# Patient Record
Sex: Female | Born: 1935 | Race: White | Hispanic: No | Smoking: Never smoker
Health system: Southern US, Community
[De-identification: ages and names within clinical notes are randomized; demographics above are authoritative.]

## PROBLEM LIST (undated history)

## (undated) DIAGNOSIS — F32A Depression, unspecified: Secondary | ICD-10-CM

## (undated) DIAGNOSIS — I509 Heart failure, unspecified: Secondary | ICD-10-CM

## (undated) DIAGNOSIS — E079 Disorder of thyroid, unspecified: Secondary | ICD-10-CM

## (undated) DIAGNOSIS — F0390 Unspecified dementia without behavioral disturbance: Secondary | ICD-10-CM

## (undated) DIAGNOSIS — I10 Essential (primary) hypertension: Secondary | ICD-10-CM

## (undated) DIAGNOSIS — I4891 Unspecified atrial fibrillation: Secondary | ICD-10-CM

## (undated) DIAGNOSIS — I2510 Atherosclerotic heart disease of native coronary artery without angina pectoris: Secondary | ICD-10-CM

## (undated) DIAGNOSIS — I639 Cerebral infarction, unspecified: Secondary | ICD-10-CM

## (undated) DIAGNOSIS — K219 Gastro-esophageal reflux disease without esophagitis: Secondary | ICD-10-CM

## (undated) DIAGNOSIS — I255 Ischemic cardiomyopathy: Secondary | ICD-10-CM

## (undated) DIAGNOSIS — F329 Major depressive disorder, single episode, unspecified: Secondary | ICD-10-CM

## (undated) HISTORY — PX: CARDIAC CATHETERIZATION: SHX172

## (undated) HISTORY — PX: TONSILLECTOMY: SUR1361

## (undated) HISTORY — PX: CHOLECYSTECTOMY: SHX55

## (undated) HISTORY — PX: FOOT SURGERY: SHX648

## (undated) HISTORY — PX: OTHER SURGICAL HISTORY: SHX169

## (undated) HISTORY — PX: HERNIA REPAIR: SHX51

---

## 2004-10-25 ENCOUNTER — Ambulatory Visit: Payer: Self-pay | Admitting: Internal Medicine

## 2005-01-06 ENCOUNTER — Inpatient Hospital Stay: Payer: Self-pay | Admitting: Internal Medicine

## 2005-01-06 ENCOUNTER — Other Ambulatory Visit: Payer: Self-pay

## 2005-02-26 ENCOUNTER — Ambulatory Visit: Payer: Self-pay | Admitting: Internal Medicine

## 2005-08-20 ENCOUNTER — Emergency Department: Payer: Self-pay | Admitting: Emergency Medicine

## 2005-12-31 ENCOUNTER — Ambulatory Visit: Payer: Self-pay | Admitting: Internal Medicine

## 2006-02-22 ENCOUNTER — Inpatient Hospital Stay: Payer: Self-pay | Admitting: Internal Medicine

## 2006-02-22 ENCOUNTER — Other Ambulatory Visit: Payer: Self-pay

## 2007-02-09 ENCOUNTER — Ambulatory Visit: Payer: Self-pay | Admitting: Internal Medicine

## 2007-02-15 ENCOUNTER — Ambulatory Visit: Payer: Self-pay | Admitting: Internal Medicine

## 2007-10-28 ENCOUNTER — Ambulatory Visit: Payer: Self-pay | Admitting: Internal Medicine

## 2008-01-12 ENCOUNTER — Ambulatory Visit: Payer: Self-pay | Admitting: Gastroenterology

## 2008-01-24 ENCOUNTER — Ambulatory Visit: Payer: Self-pay | Admitting: Gastroenterology

## 2008-02-03 ENCOUNTER — Ambulatory Visit: Payer: Self-pay | Admitting: Gastroenterology

## 2008-04-18 ENCOUNTER — Ambulatory Visit: Payer: Self-pay | Admitting: Gastroenterology

## 2008-05-09 ENCOUNTER — Ambulatory Visit: Payer: Self-pay | Admitting: General Surgery

## 2008-05-19 ENCOUNTER — Ambulatory Visit: Payer: Self-pay | Admitting: General Surgery

## 2009-02-16 ENCOUNTER — Ambulatory Visit: Payer: Self-pay | Admitting: Gastroenterology

## 2009-02-19 ENCOUNTER — Ambulatory Visit: Payer: Self-pay | Admitting: Gastroenterology

## 2009-03-21 ENCOUNTER — Ambulatory Visit: Payer: Self-pay | Admitting: Gastroenterology

## 2010-03-22 ENCOUNTER — Ambulatory Visit: Payer: Self-pay | Admitting: Internal Medicine

## 2010-03-22 ENCOUNTER — Emergency Department: Payer: Self-pay | Admitting: Emergency Medicine

## 2010-04-10 ENCOUNTER — Ambulatory Visit: Payer: Self-pay | Admitting: Gastroenterology

## 2010-05-24 ENCOUNTER — Ambulatory Visit: Payer: Self-pay | Admitting: Gastroenterology

## 2011-01-19 ENCOUNTER — Observation Stay: Payer: Self-pay | Admitting: Internal Medicine

## 2011-11-23 ENCOUNTER — Emergency Department: Payer: Self-pay | Admitting: Emergency Medicine

## 2011-11-23 LAB — COMPREHENSIVE METABOLIC PANEL
Bilirubin,Total: 0.4 mg/dL (ref 0.2–1.0)
Calcium, Total: 8.6 mg/dL (ref 8.5–10.1)
Chloride: 111 mmol/L — ABNORMAL HIGH (ref 98–107)
Co2: 26 mmol/L (ref 21–32)
EGFR (African American): >60
EGFR (Non-African Amer.): 59 — ABNORMAL LOW
Glucose: 86 mg/dL (ref 65–99)
Osmolality: 293 (ref 275–301)
SGPT (ALT): 12 U/L
Sodium: 147 mmol/L — ABNORMAL HIGH (ref 136–145)
Total Protein: 6.4 g/dL (ref 6.4–8.2)

## 2011-11-23 LAB — CBC
HCT: 38.4 % (ref 35.0–47.0)
HGB: 12.5 g/dL (ref 12.0–16.0)
MCH: 29.2 pg (ref 26.0–34.0)
MCV: 90 fL (ref 80–100)
RBC: 4.3 10*6/uL (ref 3.80–5.20)
RDW: 15.2 % — ABNORMAL HIGH (ref 11.5–14.5)

## 2011-11-23 LAB — ETHANOL: Ethanol %: <0.003 % (ref 0.000–0.080)

## 2011-11-23 LAB — TSH: Thyroid Stimulating Horm: 0.103 u[IU]/mL — ABNORMAL LOW

## 2012-01-30 ENCOUNTER — Emergency Department: Payer: Self-pay | Admitting: Emergency Medicine

## 2012-01-30 LAB — CBC
HCT: 38.7 % (ref 35.0–47.0)
MCH: 30.4 pg (ref 26.0–34.0)
MCHC: 34 g/dL (ref 32.0–36.0)
MCV: 89 fL (ref 80–100)
Platelet: 174 10*3/uL (ref 150–440)
RDW: 16.1 % — ABNORMAL HIGH (ref 11.5–14.5)

## 2012-01-30 LAB — COMPREHENSIVE METABOLIC PANEL
Albumin: 3.7 g/dL (ref 3.4–5.0)
Anion Gap: 9 (ref 7–16)
BUN: 15 mg/dL (ref 7–18)
Calcium, Total: 9.1 mg/dL (ref 8.5–10.1)
Chloride: 110 mmol/L — ABNORMAL HIGH (ref 98–107)
Creatinine: 1.02 mg/dL (ref 0.60–1.30)
EGFR (African American): 57 — ABNORMAL LOW
Glucose: 101 mg/dL — ABNORMAL HIGH (ref 65–99)
Osmolality: 290 (ref 275–301)
Potassium: 3.6 mmol/L (ref 3.5–5.1)
SGOT(AST): 24 U/L (ref 15–37)
SGPT (ALT): 15 U/L
Sodium: 145 mmol/L (ref 136–145)
Total Protein: 6.9 g/dL (ref 6.4–8.2)

## 2012-01-30 LAB — URINALYSIS, COMPLETE
Bacteria: NONE SEEN
Glucose,UR: NEGATIVE mg/dL (ref 0–75)
Hyaline Cast: 1
Nitrite: NEGATIVE
RBC,UR: 2 /HPF (ref 0–5)
Specific Gravity: 1.017 (ref 1.003–1.030)
Squamous Epithelial: <1
WBC UR: 2 /HPF (ref 0–5)

## 2012-01-30 LAB — TROPONIN I: Troponin-I: <0.02 ng/mL

## 2012-01-30 LAB — CK TOTAL AND CKMB (NOT AT ARMC): CK, Total: 258 U/L — ABNORMAL HIGH (ref 21–215)

## 2012-01-30 LAB — MAGNESIUM: Magnesium: 1.7 mg/dL — ABNORMAL LOW

## 2012-01-30 LAB — TSH: Thyroid Stimulating Horm: 0.166 u[IU]/mL — ABNORMAL LOW

## 2012-04-21 ENCOUNTER — Emergency Department: Payer: Self-pay

## 2012-04-21 LAB — COMPREHENSIVE METABOLIC PANEL
Albumin: 3.5 g/dL (ref 3.4–5.0)
Anion Gap: 8 (ref 7–16)
Calcium, Total: 8.7 mg/dL (ref 8.5–10.1)
Co2: 25 mmol/L (ref 21–32)
EGFR (African American): >60
EGFR (Non-African Amer.): 54 — ABNORMAL LOW
Glucose: 90 mg/dL (ref 65–99)
Osmolality: 287 (ref 275–301)
Potassium: 3.9 mmol/L (ref 3.5–5.1)
SGOT(AST): 17 U/L (ref 15–37)
SGPT (ALT): 14 U/L (ref 12–78)
Total Protein: 6.6 g/dL (ref 6.4–8.2)

## 2012-04-21 LAB — CBC
HGB: 12.7 g/dL (ref 12.0–16.0)
RBC: 4.42 10*6/uL (ref 3.80–5.20)

## 2012-04-21 LAB — URINALYSIS, COMPLETE
Bacteria: NONE SEEN
Bilirubin,UR: NEGATIVE
Glucose,UR: NEGATIVE mg/dL (ref 0–75)
Nitrite: NEGATIVE
Protein: NEGATIVE
RBC,UR: 1 /HPF (ref 0–5)
Specific Gravity: 1.017 (ref 1.003–1.030)
Squamous Epithelial: <1

## 2012-04-21 LAB — TROPONIN I
Troponin-I: <0.02 ng/mL
Troponin-I: <0.02 ng/mL

## 2012-04-23 LAB — URINE CULTURE

## 2012-11-24 ENCOUNTER — Emergency Department: Payer: Self-pay | Admitting: Emergency Medicine

## 2012-11-24 LAB — CK TOTAL AND CKMB (NOT AT ARMC)
CK, Total: 62 U/L (ref 21–215)
CK-MB: 1.8 ng/mL (ref 0.5–3.6)

## 2012-11-24 LAB — CBC
HGB: 13.5 g/dL (ref 12.0–16.0)
MCHC: 33.5 g/dL (ref 32.0–36.0)
MCV: 87 fL (ref 80–100)
Platelet: 196 10*3/uL (ref 150–440)
RBC: 4.6 10*6/uL (ref 3.80–5.20)
WBC: 6.7 10*3/uL (ref 3.6–11.0)

## 2012-11-24 LAB — URINALYSIS, COMPLETE
Bilirubin,UR: NEGATIVE
Blood: NEGATIVE
Glucose,UR: NEGATIVE mg/dL (ref 0–75)
Hyaline Cast: 1
Ketone: NEGATIVE
Nitrite: NEGATIVE
Specific Gravity: 1.016 (ref 1.003–1.030)
Squamous Epithelial: <1
WBC UR: 1 /HPF (ref 0–5)

## 2012-11-24 LAB — COMPREHENSIVE METABOLIC PANEL
BUN: 11 mg/dL (ref 7–18)
Calcium, Total: 8.4 mg/dL — ABNORMAL LOW (ref 8.5–10.1)
Co2: 29 mmol/L (ref 21–32)
Creatinine: 1.09 mg/dL (ref 0.60–1.30)
Glucose: 136 mg/dL — ABNORMAL HIGH (ref 65–99)
Osmolality: 286 (ref 275–301)
Potassium: 3.4 mmol/L — ABNORMAL LOW (ref 3.5–5.1)
SGOT(AST): 20 U/L (ref 15–37)
SGPT (ALT): 14 U/L (ref 12–78)
Sodium: 143 mmol/L (ref 136–145)
Total Protein: 6.5 g/dL (ref 6.4–8.2)

## 2013-03-11 ENCOUNTER — Emergency Department: Payer: Self-pay | Admitting: Emergency Medicine

## 2013-03-11 LAB — CBC WITH DIFFERENTIAL/PLATELET
Basophil #: 0 10*3/uL (ref 0.0–0.1)
Basophil %: 0.7 %
Eosinophil #: 0.1 10*3/uL (ref 0.0–0.7)
Eosinophil %: 1.5 %
HCT: 39.7 % (ref 35.0–47.0)
Lymphocyte #: 1.5 10*3/uL (ref 1.0–3.6)
Lymphocyte %: 27 %
MCH: 28.2 pg (ref 26.0–34.0)
MCHC: 33 g/dL (ref 32.0–36.0)
MCV: 85 fL (ref 80–100)
Monocyte %: 7.9 %
Neutrophil #: 3.5 10*3/uL (ref 1.4–6.5)
Platelet: 186 10*3/uL (ref 150–440)

## 2013-03-11 LAB — COMPREHENSIVE METABOLIC PANEL
Alkaline Phosphatase: 73 U/L (ref 50–136)
BUN: 13 mg/dL (ref 7–18)
Creatinine: 1 mg/dL (ref 0.60–1.30)
EGFR (Non-African Amer.): 50 — ABNORMAL LOW
Osmolality: 285 (ref 275–301)
Potassium: 3.9 mmol/L (ref 3.5–5.1)
SGOT(AST): 32 U/L (ref 15–37)
SGPT (ALT): 15 U/L (ref 12–78)

## 2013-03-11 LAB — URINALYSIS, COMPLETE
Bacteria: NONE SEEN
Glucose,UR: NEGATIVE mg/dL (ref 0–75)
Ph: 5 (ref 4.5–8.0)
Protein: NEGATIVE
RBC,UR: 2 /HPF (ref 0–5)
Specific Gravity: 1.02 (ref 1.003–1.030)
Squamous Epithelial: 1

## 2013-03-11 LAB — TSH: Thyroid Stimulating Horm: 0.05 u[IU]/mL — ABNORMAL LOW

## 2013-03-11 LAB — PRO B NATRIURETIC PEPTIDE: B-Type Natriuretic Peptide: 2043 pg/mL — ABNORMAL HIGH (ref 0–450)

## 2013-03-11 LAB — TROPONIN I: Troponin-I: <0.02 ng/mL

## 2014-04-29 ENCOUNTER — Inpatient Hospital Stay: Payer: Self-pay | Admitting: Specialist

## 2014-04-29 LAB — URINALYSIS, COMPLETE
Bacteria: NONE SEEN
Bilirubin,UR: NEGATIVE
Blood: NEGATIVE
Glucose,UR: NEGATIVE mg/dL (ref 0–75)
Ketone: NEGATIVE
Leukocyte Esterase: NEGATIVE
Nitrite: NEGATIVE
Ph: 6 (ref 4.5–8.0)
Protein: NEGATIVE
SPECIFIC GRAVITY: 1.008 (ref 1.003–1.030)
WBC UR: 3 /HPF (ref 0–5)

## 2014-04-29 LAB — CBC
HCT: 40.8 % (ref 35.0–47.0)
HGB: 12.5 g/dL (ref 12.0–16.0)
MCH: 25 pg — AB (ref 26.0–34.0)
MCHC: 30.7 g/dL — AB (ref 32.0–36.0)
MCV: 82 fL (ref 80–100)
PLATELETS: 241 10*3/uL (ref 150–440)
RBC: 5.01 10*6/uL (ref 3.80–5.20)
RDW: 16.9 % — ABNORMAL HIGH (ref 11.5–14.5)
WBC: 7.1 10*3/uL (ref 3.6–11.0)

## 2014-04-29 LAB — BASIC METABOLIC PANEL
ANION GAP: 10 (ref 7–16)
BUN: 18 mg/dL (ref 7–18)
CALCIUM: 8.8 mg/dL (ref 8.5–10.1)
Chloride: 108 mmol/L — ABNORMAL HIGH (ref 98–107)
Co2: 27 mmol/L (ref 21–32)
Creatinine: 1.22 mg/dL (ref 0.60–1.30)
GFR CALC AF AMER: 53 — AB
GFR CALC NON AF AMER: 44 — AB
Glucose: 111 mg/dL — ABNORMAL HIGH (ref 65–99)
OSMOLALITY: 291 (ref 275–301)
POTASSIUM: 3.7 mmol/L (ref 3.5–5.1)
SODIUM: 145 mmol/L (ref 136–145)

## 2014-04-29 LAB — TROPONIN I
TROPONIN-I: 0.07 ng/mL — AB
TROPONIN-I: 0.07 ng/mL — AB
Troponin-I: 0.07 ng/mL — ABNORMAL HIGH

## 2014-04-29 LAB — TSH: Thyroid Stimulating Horm: 0.38 u[IU]/mL — ABNORMAL LOW

## 2014-04-29 LAB — PRO B NATRIURETIC PEPTIDE: B-TYPE NATIURETIC PEPTID: 11144 pg/mL — AB (ref 0–450)

## 2014-04-29 LAB — T4, FREE: Free Thyroxine: 1.3 ng/dL (ref 0.76–1.46)

## 2014-04-30 LAB — BASIC METABOLIC PANEL
ANION GAP: 9 (ref 7–16)
BUN: 20 mg/dL — ABNORMAL HIGH (ref 7–18)
CO2: 28 mmol/L (ref 21–32)
CREATININE: 1.19 mg/dL (ref 0.60–1.30)
Calcium, Total: 8.8 mg/dL (ref 8.5–10.1)
Chloride: 108 mmol/L — ABNORMAL HIGH (ref 98–107)
GFR CALC AF AMER: 55 — AB
GFR CALC NON AF AMER: 45 — AB
Glucose: 113 mg/dL — ABNORMAL HIGH (ref 65–99)
OSMOLALITY: 292 (ref 275–301)
POTASSIUM: 3.8 mmol/L (ref 3.5–5.1)
SODIUM: 145 mmol/L (ref 136–145)

## 2014-04-30 LAB — LIPID PANEL
Cholesterol: 109 mg/dL (ref 0–200)
HDL Cholesterol: 37 mg/dL — ABNORMAL LOW (ref 40–60)
Ldl Cholesterol, Calc: 47 mg/dL (ref 0–100)
Triglycerides: 123 mg/dL (ref 0–200)
VLDL Cholesterol, Calc: 25 mg/dL (ref 5–40)

## 2014-04-30 LAB — PRO B NATRIURETIC PEPTIDE: B-TYPE NATIURETIC PEPTID: 8825 pg/mL — AB (ref 0–450)

## 2014-04-30 LAB — MAGNESIUM: MAGNESIUM: 2 mg/dL

## 2014-10-27 NOTE — Consult Note (Signed)
PATIENT NAME:  Heidi Miller, Heidi Miller MR#:  [id number] DATE OF BIRTH:  [birth year]  DATE OF CONSULTATION:  03/11/2013  CONSULTING PHYSICIAN:  Emmelia Holdsworth B. Jennet Maduro, MD  REQUESTING PHYSICIAN:  Daryel November, MD  REASON FOR CONSULTATION: To evaluate suicidal patient.   IDENTIFYING DATA: Heidi Miller is an 81 year old female with history of mild depression and cognitive decline.   CHIEF COMPLAINT: "I cannot think of a reason not to kill myself."   HISTORY OF PRESENT ILLNESS:  Heidi Miller has a history of mild depression. She has been treated with Celexa and Remeron Dr. Hewitt Shorts, her primary physician. She also, per family report, suffers mild cognitive decline, for which she is treated with Namenda. However, the patient continues to live independently and has been able to take full care of her household. Her niece lives next door and helps. It was the niece who brought this patient to the Emergency Room with complaints of weakness. In talking to Emergency Room personnel, the patient disclosed that for the past week or so she has been feeling increasingly down and frankly suicidal. She has never experienced similar symptoms. She has been tearful and unable to explain what has been going on, but rather stern about her suicidal thoughts. She reports many symptoms of depression with poor sleep, decreased appetite, anhedonia, crying, feeling of guilt, hopelessness, poor memory and concentration, social isolation. She was initially referred to geropsychiatry units in our area. However, her niece, who brought the patient to the hospital, demanded that she is evaluated by a psychiatrist, and was in disagreement with our assessment that the patient is suicidal and has to be admitted. After talking to her, I believe that I understood that the niece, who is not the closest relative, did not want to take responsibility for psychiatric hospitalization. She felt that patient's daughter, who is healthcare power of attorney, but  unfortunately out of town, should be involved. She was able to understand that once the patient is on involuntary commitment, the case is out of the family's hands. The patient herself does not mind admission to the hospital, as she would like to get better. She denies psychotic symptoms. She denies symptoms suggestive of bipolar mania. There is no history of alcohol, substance or prescription pill abuse.   PAST PSYCHIATRIC HISTORY:  None, as above. She has never been hospitalized. No suicide attempts. Has never had a psychiatrist.   FAMILY PSYCHIATRIC HISTORY:  None.   PAST MEDICAL HISTORY:  Hypothyroidism, hypertension, GERD.   ALLERGIES:  No known drug allergies.   MEDICATIONS ON ADMISSION:  Aspirin 81 mg daily, citalopram 10 mg daily, Synthroid 0.1 mg daily, Namenda 10 mg twice daily, metoprolol XL 25 mg daily, Remeron 22.5 mg daily, Prilosec 20 mg daily.   SOCIAL HISTORY: As above. She lives by herself, with the family around. Her daughter is healthcare power of attorney.   REVIEW OF SYSTEMS:    CONSTITUTIONAL: No fevers or chills. Positive for weight changes. The patient reports that initially she lost some weight and then gained it back.  EYES: No double or blurred vision.  EARS, NOSE, THROAT: Positive for some hearing loss.  RESPIRATORY: No shortness of breath or cough.  CARDIOVASCULAR: No chest pain or orthopnea.  GASTROINTESTINAL: No abdominal pain, nausea, vomiting or diarrhea.  GENITOURINARY: No incontinence or frequency.  ENDOCRINE: No heat or cold intolerance.  LYMPHATIC: No anemia or easy bruising.  INTEGUMENTARY: No acne or rash.  MUSCULOSKELETAL: No muscle or joint pain.  NEUROLOGIC: No tingling or weakness.  PSYCHIATRIC: See history of present illness for details.   PHYSICAL EXAMINATION: VITAL SIGNS: Blood pressure 141/65, pulse 68, respirations 18, temperature 98.7.  GENERAL: This is a slightly obese female in no acute distress. The rest of the physical examination  is deferred to her primary attending.   LABORATORY DATA: Chemistries: Blood glucose 166, natriuretic peptide 2043, BUN 13, creatinine 1. LFTs within normal limits. TSH 0.05, with free thyroxine 1.47. CBC within normal limits. Urinalysis: Leukocyte esterase 2 +, with 38 white blood cells per field.   MENTAL STATUS EXAMINATION: The patient is examined in the Emergency Room. She is in a hospital bed. She is alert and oriented to person, place, time and situation. She is pleasant, polite and cooperative. She is very well-groomed. She is wearing a hospital gown. She maintains good eye contact. She is slightly hard of hearing. Her speech is soft. Mood is depressed, with tearful affect. Thought process is logical. Thought content: The patient states over and over again that she sees no reason to live. She makes a comment that there is nothing that would stop her from suicide, but I do not believe that she has a plan. There are no delusions or paranoia. There are no auditory or visual hallucinations. Her cognition is grossly intact, even though the patient admits that her memory is not as good as it used to be, enough, though, to live independently. Her insight and judgment are fair.   SUICIDE RISK ASSESSMENT: This is a patient with a history of depression and mild cognitive decline, who developed worsening of depression and suicidal ideation over the past week. She is at increased risk of suicide.   DIAGNOSES: AXIS I: Major depressive disorder.   AXIS II: Deferred.   AXIS III: Hypertension, GERD, hypothyroidism. Rule out urinary tract infection.   AXIS IV: Mental and physical illness, cognitive decline.   AXIS V: GAF 25.   PLAN: 1. The patient is on involuntary inpatient psychiatric commitment. She is referred to geropsychiatry facilities in our area. 2.   I restarted her on all her medications, including Remeron and Celexa.  3.  I had a lengthy discussion in person with her niece, who seems to  understand that she is not responsible for future admission of this patient.  4.  Intake nurse did speak with her daughter, who is currently out of town.  5.  Psychiatry will follow up.  6.  Please consider treatment of urinary tract infection.    ____________________________ Ellin GoodieJolanta B. Jennet MaduroPucilowska, MD jbp:mr D: 03/11/2013 21:42:00 ET T: 03/11/2013 22:22:38 ET JOB#: [id number]  cc: Charleene Callegari B. Jennet MaduroPucilowska, MD, <Dictator> Shari ProwsJOLANTA B Elexis Pollak MD ELECTRONICALLY SIGNED 03/15/2013 6:29

## 2014-10-27 NOTE — Consult Note (Signed)
Brief Consult Note: Diagnosis: Major depressive disorder.   Patient was seen by consultant.   Consult note dictated.   Recommend further assessment or treatment.   Orders entered.   Comments: Heidi Miller has a h/o depression. She complains of worsening depression for the past week with suicidal ideation.   PLAN: 1. The patient is on IVC. She patient is referred to Geropsychiatry Unit.   2. I restarted all her medications including Remerom and Celexa,  3. Her niece, who opposes treatment, does so because she feels responsible for what happens to the patient in the absence of her immediate family. We spoke with the daughter who seems to agree with the plan. She will be back in town on Sunday morning.   4. Psychiatry will follow up.  5. Please treat UTI.  Electronic Signatures: Kristine LineaPucilowska, Karrine Kluttz (MD)  (Signed 05-Sep-14 17:36)  Authored: Brief Consult Note   Last Updated: 05-Sep-14 17:36 by Kristine LineaPucilowska, Jeven Topper (MD)

## 2014-10-28 NOTE — H&P (Signed)
PATIENT NAME:  Heidi Miller, Heidi Miller MR#:  [id number] DATE OF BIRTH:  1923-04-27  DATE OF ADMISSION:  04/29/2014  PRIMARY CARE PHYSICIAN: Kandyce Rud, MD.   REFERRING PHYSICIAN: Glennie Isle, MD.  CHIEF COMPLAINT: Near syncope and weakness for several days.   HISTORY OF PRESENT ILLNESS: An 81 year old, Caucasian female with a past medical history of CAD, hypertension and presently in the ED with the above chief complaint. The patient is alert, awake, oriented, in no acute distress. The patient said that for the past few days, she feels weak and dizzy and almost passed out several times. In addition, the patient has shortness of breath, but she denies any fever or chills. No cough or phlegm. She denies any chest pain, palpitations, orthopnea, nocturnal dyspnea. No leg edema. No weight gain. No weight loss. The patient denies any other symptoms.   PAST MEDICAL HISTORY: Hypertension, hyperlipidemia, hypothyroidism, nephrolithiasis, dysphagia due to esophageal stricture, MI status post stent, history of Alzheimer's disease.   PAST SURGICAL HISTORY: Left acoustic neuroma resection, cholecystectomy, umbilical hernia repair, percutaneous transluminal coronary angioplasty at [facility] in 1998, right rotator cuff repair.   SOCIAL HISTORY: No smoking, drinking or illicit drugs.   FAMILY HISTORY: Father died of MI in his 5s. Mother died of lung cancer.   ALLERGIES: None.   REVIEW OF SYSTEMS:  CONSTITUTIONAL: The patient denies any fever or chills. No headache, but has dizziness and generalized weakness.  EYES: No double vision or blurred vision.  ENT: No postnasal drip, slurred speech or dysphagia.  CARDIOVASCULAR: No chest pain, palpitations, orthopnea, nocturnal dyspnea. No leg edema.  PULMONARY: Positive for shortness of breath, but denies any cough, sputum, wheezing or hemoptysis.  GASTROINTESTINAL: No abdominal pain, nausea, vomiting, diarrhea. No melena or bloody stool.  GENITOURINARY: No dysuria,  hematuria or incontinence.  SKIN: No rash or jaundice.  NEUROLOGY: No syncope, loss of consciousness or seizure, but has presyncope.  ENDOCRINE: No polyuria, polydipsia, heat or cold intolerance.  HEMATOLOGY: No easy bruising or bleeding.   HOME MEDICATIONS: Medications reconciliation list not done yet. We will follow up.   PHYSICAL EXAMINATION: VITAL SIGNS: Temperature 98, blood pressure 111/57, pulse 110, O2 97% on room air.  GENERAL: The patient is alert, awake, oriented, in no acute distress.  HEENT: Pupils round, equal and reactive to light and accommodation. Moist oral mucosa. Clear oropharynx.  NECK: Supple. No JVD or carotid bruits. No lymphadenopathy. No thyromegaly.  CARDIOVASCULAR: S1, S2. Regular rate and rhythm. No murmurs or gallops.  PULMONARY: Bilateral air entry. No wheezing, but has rales on bilateral bases. No crackles or rhonchi. No use of accessory muscles to breathe.  ABDOMEN: Soft. No distention. No tenderness. No organomegaly. Bowel sounds present.  EXTREMITIES: No edema, clubbing or cyanosis. No calf tenderness. Bilateral pedal pulses present.  SKIN: No rash or jaundice.  NEUROLOGIC: A and O x 3. No focal deficits. Power 5/5. Sensation intact.  DIAGNOSTIC DATA: Chest x-ray showed mild congestive heart failure with borderline cardiomegaly. Small effusion. Mild interstitial thickening. No pneumonia. EKG showed Afib with RVR to 112.    LABORATORY DATA: WBC 7.1, hemoglobin 12.5, platelets 241,000. Troponin 0.07. Glucose 111, BUN 18, creatinine 1.22. Electrolytes are normal. Thyroxine 1.3. BNP 11,144.   IMPRESSION:  1. New onset atrial fibrillation with rapid ventricular response.  2. Acute on chronic congestive heart failure, unknown type.  3. Hypertension.  4. Coronary artery disease.  5. Elevated troponin, possibly due to demand ischemia.   PLAN OF TREATMENT: 1. The patient will be  admitted to a telemetry floor. We will continue telemonitor, start congestive  heart failure protocol. We will start Lasix 20 mg IV b.i.d., get an echocardiograph and a cardiology consult from Dr[facility]als[facility].  2. We will continue aspirin 325 mg p.o. daily with a statin. The patient has multiple times of near syncope and has a high risk for fall. She is not a candidate for anticoagulation. We will get a physical therapy evaluation.  3. The patient's thyroid-stimulating hormone is suppressed. We will hold levothyroxine at this time.  4. I discussed the patient's condition and plan of treatment with the patient and the patient's daughter.  5. The patient wants full code.   TIME SPENT: About 56 minutes.     ____________________________ Shaune PollackQing Yetta Marceaux, MD qc:TT D: 04/29/2014 16:21:33 ET T: 04/29/2014 16:44:55 ET JOB#: [id number]  cc: Shaune PollackQing Ryenn Howeth, MD, <Dictator> Shaune PollackQING Rose Hippler MD ELECTRONICALLY SIGNED 04/30/2014 10:43

## 2014-10-28 NOTE — H&P (Signed)
PATIENT NAME:  Heidi Miller, Heidi Miller MR#:  [id number] DATE OF BIRTH:  1922/07/25  DATE OF ADMISSION:  04/29/2014  ADDENDUM  HOME MEDICATION LIST: Citalopram 20 mg tablet 0.5 tablet b.i.d., mirtazapine 45 mg 0.5 tablet p.o. at bedtime, Toprol 50 mg p.o. 0.5 tablet once a day, rivastigmine 1.5 mg p.o. b.i.d.,  Namenda 10 mg p.o. daily, omeprazole 20 mg p.o. daily, and levothyroxine 100 mcg p.o. daily.    ____________________________ Shaune PollackQing Zymiere Trostle, MD qc:ts D: 04/29/2014 18:30:04 ET T: 04/29/2014 20:25:07 ET JOB#: [id number]  cc: Shaune PollackQing Denette Hass, MD, <Dictator> Shaune PollackQING Teletha Petrea MD ELECTRONICALLY SIGNED 04/30/2014 10:54

## 2014-10-28 NOTE — Discharge Summary (Signed)
PATIENT NAME:  Heidi Miller, Heidi Miller MR#:  [id number] DATE OF BIRTH:  [birth year]  DATE OF ADMISSION:  04/29/2014 DATE OF DISCHARGE:  05/02/2014   STAT DISCHARGE SUMMARY  For a detailed note, please take a look at the history and physical done on admission by Dr. Shaune Pollack.   DIAGNOSES AT DISCHARGE:  1.  New onset atrial fibrillation with rapid ventricular response.  2.  Cardiomyopathy, ejection fraction of 20%. 3.  Congestive heart failure.  4.  Acute on chronic systolic dysfunction.  5.  Dementia.  6.  Hypertension.  7.  Hypothyroidism.   DIET: The patient is being discharged on a low-sodium diet.   ACTIVITY: As tolerated.   FOLLOWUP: Dr. Kandyce Rud and also Dr. Arnoldo Hooker in the next 1-2 weeks.   DISCHARGE MEDICATIONS: Rivastigmine 1.5 mg b.i.d., Namenda 10 mg b.i.d., omeprazole 20 mg daily, Synthroid 100 mcg daily, Remeron 45 mg 1/2 tablet at bedtime, Seroquel 50 mg daily, Effexor 150 mg daily, Aldactone 25 mg daily, lisinopril 5 mg daily, apixaban 2.5 mg b.i.d., Lasix 40 mg daily, metoprolol succinate 50 mg b.i.d.   CONSULTANTS DURING THE HOSPITAL COURSE: Dr. Arnoldo Hooker from cardiology.   PERTINENT STUDIES DONE DURING THE HOSPITAL COURSE: A chest x-ray done on admission showing findings suggestive of congestive heart failure with borderline cardiomegaly. A 2-dimensional echocardiogram done showing ejection fraction of less than 20%, severely global LV systolic dysfunction, moderate to severe tricuspid regurgitation, moderate to severe mitral valve regurgitation, moderately elevated pulmonary artery systolic pressure, mildly increased left ventricular posterior wall thickness.   HOSPITAL COURSE: This is an 81 year old female with medical problems as mentioned above, who presented to the hospital on 04/29/2014 secondary to syncope and weakness for several days, and noted to be in acute on chronic congestive heart failure with new onset atrial fibrillation.  1.  Atrial  fibrillation. This was new onset for the patient. The patient's rates were controlled with pulse doses of IV metoprolol and Cardizem. After being started on some oral regimen of Toprol, her heart rate has improved. She continues to be in chronic AFib, but it is rate controlled. Her CHADS score is around 4; therefore, she is high risk for stroke. Therefore, anticoagulation was discussed with her, and she is currently being discharged on oral Eliquis. Her echocardiogram showed an EF of 20%. The patient was seen by cardiology who agreed with this management, and she will follow-up with them in a week or 10 days after discharge.  2.  Congestive heart failure. This is acute on chronic systolic congestive heart failure. The patient had a cardiomyopathy, EF of less than 20%. This is likely AFib-related cardiomyopathy and not ischemic cardiomyopathy. The patient was diuresed with IV Lasix. She has clinically improved. She is currently being discharged on oral Lasix along with oral beta blockers, ACE inhibitors and Aldactone as stated. The patient will follow up with cardiology as an outpatient.  3.  Hypertension. The patient remains hemodynamically stable. She will continue her Toprol, lisinopril and Aldactone as stated.  4.  History of coronary artery disease with stent placement. The patient had acute chest pain. Her cardiac markers were negative. She will continue the above medications as stated.  5.  Dementia. The patient will continue her Namenda, Remeron and Rivastigmine as stated.   CODE STATUS: The patient is a Full Code.   DISPOSITION: She is being discharged to a skilled nursing facility for ongoing care.    TIME SPENT: 40 minutes    ____________________________ Rolly Pancake.  Cherlynn KaiserSainani, MD vjs:MT D: 05/02/2014 12:00:22 ET T: 05/02/2014 12:28:00 ET JOB#: [id number]  cc: Rolly PancakeVivek J. Cherlynn KaiserSainani, MD, <Dictator> Kandyce RudMarcus Babaoff, MD Lamar BlinksBruce J. Kowalski, MD Houston SirenVIVEK J Danielle Lento MD ELECTRONICALLY SIGNED 05/15/2014 11:05

## 2015-05-05 ENCOUNTER — Encounter: Payer: Self-pay | Admitting: Emergency Medicine

## 2015-05-05 ENCOUNTER — Emergency Department
Admission: EM | Admit: 2015-05-05 | Discharge: 2015-05-05 | Disposition: A | Discharge status: Home / Self Care | Payer: PPO | Attending: Emergency Medicine | Admitting: Emergency Medicine

## 2015-05-05 DIAGNOSIS — M6281 Muscle weakness (generalized): Secondary | ICD-10-CM | POA: Insufficient documentation

## 2015-05-05 DIAGNOSIS — I10 Essential (primary) hypertension: Secondary | ICD-10-CM | POA: Diagnosis not present

## 2015-05-05 DIAGNOSIS — I499 Cardiac arrhythmia, unspecified: Secondary | ICD-10-CM | POA: Diagnosis not present

## 2015-05-05 DIAGNOSIS — R531 Weakness: Secondary | ICD-10-CM | POA: Diagnosis present

## 2015-05-05 DIAGNOSIS — E860 Dehydration: Secondary | ICD-10-CM | POA: Diagnosis not present

## 2015-05-05 HISTORY — DX: Essential (primary) hypertension: I10

## 2015-05-05 HISTORY — DX: Unspecified dementia, unspecified severity, without behavioral disturbance, psychotic disturbance, mood disturbance, and anxiety: F03.90

## 2015-05-05 HISTORY — DX: Disorder of thyroid, unspecified: E07.9

## 2015-05-05 HISTORY — DX: Unspecified atrial fibrillation: I48.91

## 2015-05-05 LAB — CBC WITH DIFFERENTIAL/PLATELET
BASOS PCT: 1 %
Basophils Absolute: 0.1 10*3/uL (ref 0–0.1)
EOS ABS: 0.1 10*3/uL (ref 0–0.7)
EOS PCT: 1 %
HCT: 41.3 % (ref 35.0–47.0)
Hemoglobin: 13.5 g/dL (ref 12.0–16.0)
Lymphocytes Relative: 26 %
Lymphs Abs: 2.5 10*3/uL (ref 1.0–3.6)
MCH: 28.7 pg (ref 26.0–34.0)
MCHC: 32.8 g/dL (ref 32.0–36.0)
MCV: 87.4 fL (ref 80.0–100.0)
MONO ABS: 0.9 10*3/uL (ref 0.2–0.9)
MONOS PCT: 10 %
Neutro Abs: 6 10*3/uL (ref 1.4–6.5)
Neutrophils Relative %: 62 %
PLATELETS: 215 10*3/uL (ref 150–440)
RBC: 4.72 MIL/uL (ref 3.80–5.20)
RDW: 15.9 % — AB (ref 11.5–14.5)
WBC: 9.5 10*3/uL (ref 3.6–11.0)

## 2015-05-05 LAB — BASIC METABOLIC PANEL
ANION GAP: 8 (ref 5–15)
BUN: 23 mg/dL — AB (ref 6–20)
CHLORIDE: 107 mmol/L (ref 101–111)
CO2: 26 mmol/L (ref 22–32)
Calcium: 9.5 mg/dL (ref 8.9–10.3)
Creatinine, Ser: 1.32 mg/dL — ABNORMAL HIGH (ref 0.44–1.00)
GFR calc Af Amer: 40 mL/min — ABNORMAL LOW (ref >60)
GFR, EST NON AFRICAN AMERICAN: 34 mL/min — AB (ref >60)
GLUCOSE: 121 mg/dL — AB (ref 65–99)
POTASSIUM: 3.7 mmol/L (ref 3.5–5.1)
Sodium: 141 mmol/L (ref 135–145)

## 2015-05-05 LAB — URINALYSIS COMPLETE WITH MICROSCOPIC (ARMC ONLY)
Bilirubin Urine: NEGATIVE
GLUCOSE, UA: NEGATIVE mg/dL
KETONES UR: NEGATIVE mg/dL
NITRITE: NEGATIVE
Protein, ur: 30 mg/dL — AB
SPECIFIC GRAVITY, URINE: 1.013 (ref 1.005–1.030)
pH: 5 (ref 5.0–8.0)

## 2015-05-05 LAB — TROPONIN I

## 2015-05-05 MED ORDER — SODIUM CHLORIDE 0.9 % IV BOLUS (SEPSIS)
500.0000 mL | Freq: Once | INTRAVENOUS | Status: AC
  Administered 2015-05-05: 500 mL via INTRAVENOUS

## 2015-05-05 NOTE — ED Provider Notes (Signed)
[facility] Emergency Department Provider Note   ____________________________________________  Time seen:  I have reviewed the triage vital signs and the triage nursing note.  HISTORY  Chief Complaint Weakness   Historian Patient, who is limited historian due to dementia Daughter provides additional history  HPI Heidi Miller is a 81 y.o. female who lives at home alone. She talked with her daughter around 9:30 AM and daughter went to pick her mother up around noon and the patient did not answer the door. When the daughter got into the home she and EMS found the patient sitting in the bathtub stating that she had sat down and was unable to get back up due to generalized weakness. There is no report of traumatic injury or fall. Patient denies injury, syncope, or recent illness including no vomiting, diarrhea, nor upper respiratory symptoms.    Past Medical History  Diagnosis Date  . Hypertension   . Thyroid disease   . Atrial fibrillation (HCC)   . Dementia     There are no active problems to display for this patient.   Past Surgical History  Procedure Laterality Date  . Cholecystectomy    . Cardiac catheterization      No current outpatient prescriptions on file.  Allergies Review of patient's allergies indicates no known allergies.  No family history on file.  Social History Social History  Substance Use Topics  . Smoking status: Never Smoker   . Smokeless tobacco: Never Used  . Alcohol Use: No    Review of Systems  Constitutional: Negative for fever. Eyes: Negative for visual changes. ENT: Negative for sore throat. Cardiovascular: Negative for chest pain. Respiratory: Negative for shortness of breath. Gastrointestinal: Negative for abdominal pain, vomiting and diarrhea. Genitourinary: Negative for dysuria. Musculoskeletal: Negative for back pain. Skin: Negative for rash. Neurological: Negative for headache. 10 point Review of  Systems otherwise negative ____________________________________________   PHYSICAL EXAM:  VITAL SIGNS: ED Triage Vitals  Enc Vitals Group     BP 05/05/15 1400 107/64 mmHg     Pulse Rate 05/05/15 1402 79     Resp 05/05/15 1402 18     Temp 05/05/15 1402 98 F (36.7 C)     Temp Source 05/05/15 1402 Oral     SpO2 05/05/15 1402 97 %     Weight 05/05/15 1402 150 lb (68.04 kg)     Height 05/05/15 1402 5\' 2"  (1.575 m)     Head Cir --      Peak Flow --      Pain Score --      Pain Loc --      Pain Edu? --      Excl. in GC? --      Constitutional: Alert and cooperative. Well appearing and in no distress. Eyes: Conjunctivae are normal. PERRL. Normal extraocular movements. ENT   Head: Normocephalic and atraumatic.   Nose: No congestion/rhinnorhea.   Mouth/Throat: Mucous membranes are moist.   Neck: No stridor. Cardiovascular/Chest: Irregularly irregular. Normal rate.  No murmurs, rubs, or gallops. Respiratory: Normal respiratory effort without tachypnea nor retractions. Breath sounds are clear and equal bilaterally. No wheezes/rales/rhonchi. Gastrointestinal: Soft. No distention, no guarding, no rebound. Nontender   Genitourinary/rectal:Deferred Musculoskeletal: Nontender with normal range of motion in all extremities. No joint effusions.  No lower extremity tenderness.  No edema. Neurologic:  Normal speech and language. Mild generalized weakness of the extremities, but no focal deficit of strength or sensation.. Skin:  Skin is warm, dry and  intact. No rash noted.  ____________________________________________   EKG I, Governor Rooks, MD, the attending physician have personally viewed and interpreted all ECGs.  Atrial fibrillation. 81 bpm. Narrow QRS. Left axis deviation. Nonspecific ST and T-wave. ____________________________________________  LABS (pertinent positives/negatives)  CBC within normal limits Metabolic panel significant for BUN 23 and creatinine  1.32 Troponin less than 0.03 Urinalysis  leukocytes trace, rare bacteria, 0-5 white blood cells and 0-5 red blood cells  ____________________________________________  RADIOLOGY All Xrays were viewed by me. Imaging interpreted by Radiologist.  None __________________________________________  PROCEDURES  Procedure(s) performed: None  Critical Care performed: None  ____________________________________________   ED COURSE / ASSESSMENT AND PLAN  CONSULTATIONS: None  Pertinent labs & imaging results that were available during my care of the patient were reviewed by me and considered in my medical decision making (see chart for details).  Patient is overall well-appearing with stable vital signs. She has had no altered mental status, and is at baseline per her daughter in terms of her mental status. She does seem a little weak all over, without a focal deficit. There is no history of trauma, and on exam I see no focal deficit to make me concerned about stroke, or other intracranial emergency.  Patient is having no crackles symptoms of acute CHF.  BUN and creatinine are minimally elevated compared with prior creatinine of 1.19.   Given the mild increase in creatinine, and slight orthostatic hypotension, patient was given IV fluids until this is resolved.  I do not think the UA is consistent with acute urinary tract infection, however a culture was sent.  Patient / Family / Caregiver informed of clinical course, medical decision-making process, and agree with plan.   I discussed return precautions, follow-up instructions, and discharged instructions with patient and/or family.  ___________________________________________   FINAL CLINICAL IMPRESSION(S) / ED DIAGNOSES   Final diagnoses:  Generalized weakness  Dehydration       Governor Rooks, MD 05/05/15 1655

## 2015-05-05 NOTE — ED Notes (Signed)
Pt in no acute distress. Pt offers no complaints. Daughter at bedside.

## 2015-05-05 NOTE — ED Notes (Signed)
Pt arrived via EMS from home. Pt complained of weakness and unable to get out of tub this morning. EMS reports pt has history of dementia. EMS reports VSS.

## 2015-05-05 NOTE — ED Notes (Signed)
Pt provided with ginger ale, graham crackers and peanut butter. Daughter at bedside.

## 2015-05-05 NOTE — Discharge Instructions (Signed)
You were evaluated for generalized weakness, not have evidence of dehydration. You were given IV fluids in the emergency department. Return to the emergency department for any worsening condition including confusion or altered mental status, weakness, numbness, chest pain, trouble breathing, or any other symptoms concerning to you.   Dehydration Dehydration means your body does not have as much fluid as it needs. Your kidneys, brain, and heart will not work properly without the right amount of fluids and salt. Older adults are more likely to become dehydrated than younger adults. This is because:   Their bodies do not hold water as well.  Their bodies do not respond to temperature changes as well.  They do not get thirsty as easily or as quickly. HOME CARE  Ask your doctor how to replace body fluid losses (rehydrate).  Drink enough fluids to keep your pee (urine) clear or pale yellow.  Drink small amounts of fluids often if you feel sick to your stomach (nauseous) or throw up (vomit).  Eat like you normally do.  Avoid:  Foods or drinks high in sugar.  Bubbly (carbonated) drinks.  Juice.  Very hot or cold fluids.  Drinks with caffeine.  Fatty, greasy foods.  Alcohol.  Tobacco.  Eating too much.  Gelatin desserts.  Wash your hands to avoid spreading germs (bacteria, viruses).  Only take medicine as told by your doctor.  Keep all doctor visits as told. GET HELP IF:  You have belly (abdominal) pain that gets worse or stays in one spot (localizes).  You have a rash, stiff neck, or bad headache.  You get easily annoyed, sleepy, or are hard to wake up.  You feel weak, dizzy, or very thirsty.  You have a fever. GET HELP RIGHT AWAY IF:   You cannot drink fluid without throwing up.  You get worse even with treatment.  You throw up often.  You have watery poop (diarrhea) often.  Your vomit has blood in it or looks greenish.  Your poop (stool) has blood in  it or looks black and tarry.  You have not peed in 6 to 8 hours or have only peed a small amount of very dark pee.  You pass out (faint). MAKE SURE YOU:   Understand these instructions.  Will watch your condition.  Will get help right away if you are not doing well or get worse.   This information is not intended to replace advice given to you by your health care provider. Make sure you discuss any questions you have with your health care provider.   Document Released: 06/12/2011 Document Revised: 06/28/2013 Document Reviewed: 02/28/2013 Elsevier Interactive Patient Education [employer]2016 [employer]vier Inc.

## 2015-05-06 LAB — URINE CULTURE

## 2015-05-26 ENCOUNTER — Emergency Department
Admission: EM | Admit: 2015-05-26 | Discharge: 2015-05-26 | Disposition: A | Discharge status: Home / Self Care | Payer: PPO | Attending: Emergency Medicine | Admitting: Emergency Medicine

## 2015-05-26 ENCOUNTER — Encounter: Payer: Self-pay | Admitting: *Deleted

## 2015-05-26 DIAGNOSIS — Y9289 Other specified places as the place of occurrence of the external cause: Secondary | ICD-10-CM | POA: Diagnosis not present

## 2015-05-26 DIAGNOSIS — S40812A Abrasion of left upper arm, initial encounter: Secondary | ICD-10-CM | POA: Diagnosis not present

## 2015-05-26 DIAGNOSIS — Y998 Other external cause status: Secondary | ICD-10-CM | POA: Insufficient documentation

## 2015-05-26 DIAGNOSIS — T148XXA Other injury of unspecified body region, initial encounter: Secondary | ICD-10-CM

## 2015-05-26 DIAGNOSIS — I10 Essential (primary) hypertension: Secondary | ICD-10-CM | POA: Insufficient documentation

## 2015-05-26 DIAGNOSIS — L03116 Cellulitis of left lower limb: Secondary | ICD-10-CM | POA: Insufficient documentation

## 2015-05-26 DIAGNOSIS — S40811A Abrasion of right upper arm, initial encounter: Secondary | ICD-10-CM | POA: Diagnosis present

## 2015-05-26 DIAGNOSIS — Y9389 Activity, other specified: Secondary | ICD-10-CM | POA: Diagnosis not present

## 2015-05-26 DIAGNOSIS — L03113 Cellulitis of right upper limb: Secondary | ICD-10-CM | POA: Diagnosis not present

## 2015-05-26 DIAGNOSIS — W548XXA Other contact with dog, initial encounter: Secondary | ICD-10-CM | POA: Diagnosis not present

## 2015-05-26 MED ORDER — CEPHALEXIN 500 MG PO CAPS: 500.0000 mg | ORAL_CAPSULE | Freq: Four times a day (QID) | ORAL | Status: DC

## 2015-05-26 NOTE — ED Notes (Signed)
Pt has scratch marks on bilateral forearms from her neighbors dog, scratches have red areas surrounding them

## 2015-05-26 NOTE — ED Provider Notes (Signed)
[facility] Emergency Department Provider Note  ____________________________________________  Time seen: Approximately 3:47 PM  I have reviewed the triage vital signs and the nursing notes.   HISTORY  Chief Complaint Abrasion    HPI Heidi Miller is a 81 y.o. female who presents emergency department complaining of dog scratches to her arm that because infected. Per the daughter the patient was scratched approximately a week ago. The patient has been "picking at scabs" and now there is surrounding minor edema and erythema. The daughter is concerned that the patient now has infected abrasions. Patient has no other injury or symptoms. She is not complaining of anything at this time. She denies fever or chills. No numbness or tingling.   Past Medical History  Diagnosis Date  . Hypertension   . Thyroid disease   . Atrial fibrillation (HCC)   . Dementia     There are no active problems to display for this patient.   Past Surgical History  Procedure Laterality Date  . Cholecystectomy    . Cardiac catheterization      Current Outpatient Rx  Name  Route  Sig  Dispense  Refill  . cephALEXin (KEFLEX) 500 MG capsule   Oral   Take 1 capsule (500 mg total) by mouth 4 (four) times daily.   28 capsule   0     Allergies Review of patient's allergies indicates no known allergies.  No family history on file.  Social History Social History  Substance Use Topics  . Smoking status: Never Smoker   . Smokeless tobacco: Never Used  . Alcohol Use: No    Review of Systems Constitutional: No fever/chills Eyes: No visual changes. ENT: No sore throat. Cardiovascular: Denies chest pain. Respiratory: Denies shortness of breath. Gastrointestinal: No abdominal pain.  No nausea, no vomiting.  No diarrhea.  No constipation. Genitourinary: Negative for dysuria. Musculoskeletal: Negative for back pain. Skin: Negative for rash. Abrasions to bilateral  arms. Neurological: Negative for headaches, focal weakness or numbness.  10-point ROS otherwise negative.  ____________________________________________   PHYSICAL EXAM:  VITAL SIGNS: ED Triage Vitals  Enc Vitals Group     BP 05/26/15 1531 117/56 mmHg     Pulse Rate 05/26/15 1531 88     Resp 05/26/15 1531 20     Temp 05/26/15 1531 97.6 F (36.4 C)     Temp Source 05/26/15 1531 Oral     SpO2 05/26/15 1531 98 %     Weight 05/26/15 1531 150 lb (68.04 kg)     Height 05/26/15 1531  (1.6 m)     Head Cir --      Peak Flow --      Pain Score --      Pain Loc --      Pain Edu? --      Excl. in GC? --     Constitutional: Alert and oriented. Well appearing and in no acute distress. Eyes: Conjunctivae are normal. PERRL. EOMI. Head: Atraumatic. Nose: No congestion/rhinnorhea. Mouth/Throat: Mucous membranes are moist.  Oropharynx non-erythematous. Neck: No stridor.   Hematological/Lymphatic/Immunilogical: No cervical lymphadenopathy. Cardiovascular: Normal rate, regular rhythm. Grossly normal heart sounds.  Good peripheral circulation. Respiratory: Normal respiratory effort.  No retractions. Lungs CTAB. Gastrointestinal: Soft and nontender. No distention. No abdominal bruits. No CVA tenderness. Musculoskeletal: No lower extremity tenderness nor edema.  No joint effusions. Neurologic:  Normal speech and language. No gross focal neurologic deficits are appreciated. No gait instability. Skin:  Skin is warm, dry  and intact. No rash noted. Minor superficial abrasions to bilateral arms. Minimal surrounding erythema and edema. No fluctuance noted. No drainage noted. No streaking noted. Psychiatric: Mood and affect are normal. Speech and behavior are normal.  ____________________________________________   LABS (all labs ordered are listed, but only abnormal results are displayed)  Labs Reviewed - No data to  display ____________________________________________  EKG   ____________________________________________  RADIOLOGY   ____________________________________________   PROCEDURES  Procedure(s) performed: None  Critical Care performed: No  ____________________________________________   INITIAL IMPRESSION / ASSESSMENT AND PLAN / ED COURSE  Pertinent labs & imaging results that were available during my care of the patient were reviewed by me and considered in my medical decision making (see chart for details).  Patient's history, symptoms, physical exam are consistent with scratches from a dog as well as surrounding cellulitis. Advised patient and her daughter of diagnosis and they verbalized understanding. Advised patient to place her on antibiotics for a week for reduction of symptoms she verbalizes understanding of same. Patient advised not to irritate areas by picking at scabs. She verbalizes understanding same. ____________________________________________   FINAL CLINICAL IMPRESSION(S) / ED DIAGNOSES  Final diagnoses:  Scratches  Cellulitis of right upper extremity  Cellulitis of left lower extremity      Racheal PatchesJonathan D Cuthriell, PA-C 05/26/15 1609  Phineas SemenGraydon Goodman, MD 05/26/15 1622

## 2015-05-26 NOTE — Discharge Instructions (Signed)

## 2015-05-26 NOTE — ED Notes (Signed)
NAD noted at this time. Pt taken to the lobby via wheelchair at this time.

## 2016-03-14 ENCOUNTER — Inpatient Hospital Stay
Admission: EM | Admit: 2016-03-14 | Discharge: 2016-03-17 | DRG: 066 | Disposition: A | Discharge status: Home Health | Payer: PPO | Attending: Internal Medicine | Admitting: Internal Medicine

## 2016-03-14 ENCOUNTER — Encounter: Payer: Self-pay | Admitting: Emergency Medicine

## 2016-03-14 ENCOUNTER — Emergency Department: Payer: PPO

## 2016-03-14 DIAGNOSIS — Z801 Family history of malignant neoplasm of trachea, bronchus and lung: Secondary | ICD-10-CM

## 2016-03-14 DIAGNOSIS — I639 Cerebral infarction, unspecified: Principal | ICD-10-CM | POA: Diagnosis present

## 2016-03-14 DIAGNOSIS — F0390 Unspecified dementia without behavioral disturbance: Secondary | ICD-10-CM | POA: Diagnosis present

## 2016-03-14 DIAGNOSIS — Z7982 Long term (current) use of aspirin: Secondary | ICD-10-CM

## 2016-03-14 DIAGNOSIS — Z7901 Long term (current) use of anticoagulants: Secondary | ICD-10-CM

## 2016-03-14 DIAGNOSIS — I2510 Atherosclerotic heart disease of native coronary artery without angina pectoris: Secondary | ICD-10-CM | POA: Diagnosis present

## 2016-03-14 DIAGNOSIS — Z79899 Other long term (current) drug therapy: Secondary | ICD-10-CM

## 2016-03-14 DIAGNOSIS — I10 Essential (primary) hypertension: Secondary | ICD-10-CM | POA: Diagnosis present

## 2016-03-14 DIAGNOSIS — E785 Hyperlipidemia, unspecified: Secondary | ICD-10-CM | POA: Diagnosis present

## 2016-03-14 DIAGNOSIS — E039 Hypothyroidism, unspecified: Secondary | ICD-10-CM | POA: Diagnosis present

## 2016-03-14 DIAGNOSIS — I4891 Unspecified atrial fibrillation: Secondary | ICD-10-CM | POA: Diagnosis present

## 2016-03-14 DIAGNOSIS — Z8249 Family history of ischemic heart disease and other diseases of the circulatory system: Secondary | ICD-10-CM | POA: Diagnosis not present

## 2016-03-14 DIAGNOSIS — R1310 Dysphagia, unspecified: Secondary | ICD-10-CM | POA: Diagnosis present

## 2016-03-14 DIAGNOSIS — R4702 Dysphasia: Secondary | ICD-10-CM | POA: Diagnosis present

## 2016-03-14 DIAGNOSIS — R29810 Facial weakness: Secondary | ICD-10-CM | POA: Diagnosis present

## 2016-03-14 LAB — COMPREHENSIVE METABOLIC PANEL
ALBUMIN: 4.1 g/dL (ref 3.5–5.0)
ALT: 11 U/L — ABNORMAL LOW (ref 14–54)
ANION GAP: 12 (ref 5–15)
AST: 22 U/L (ref 15–41)
Alkaline Phosphatase: 85 U/L (ref 38–126)
BILIRUBIN TOTAL: 0.6 mg/dL (ref 0.3–1.2)
BUN: 30 mg/dL — ABNORMAL HIGH (ref 6–20)
CALCIUM: 9.6 mg/dL (ref 8.9–10.3)
CO2: 24 mmol/L (ref 22–32)
Chloride: 105 mmol/L (ref 101–111)
Creatinine, Ser: 1.42 mg/dL — ABNORMAL HIGH (ref 0.44–1.00)
GFR, EST AFRICAN AMERICAN: 36 mL/min — AB (ref >60)
GFR, EST NON AFRICAN AMERICAN: 31 mL/min — AB (ref >60)
Glucose, Bld: 209 mg/dL — ABNORMAL HIGH (ref 65–99)
POTASSIUM: 4 mmol/L (ref 3.5–5.1)
Sodium: 141 mmol/L (ref 135–145)
TOTAL PROTEIN: 7.3 g/dL (ref 6.5–8.1)

## 2016-03-14 LAB — CBC
HCT: 40.4 % (ref 35.0–47.0)
HEMOGLOBIN: 13.1 g/dL (ref 12.0–16.0)
MCH: 27.1 pg (ref 26.0–34.0)
MCHC: 32.4 g/dL (ref 32.0–36.0)
MCV: 83.7 fL (ref 80.0–100.0)
Platelets: 244 10*3/uL (ref 150–440)
RBC: 4.83 MIL/uL (ref 3.80–5.20)
RDW: 17.8 % — ABNORMAL HIGH (ref 11.5–14.5)
WBC: 8.7 10*3/uL (ref 3.6–11.0)

## 2016-03-14 LAB — ETHANOL: Alcohol, Ethyl (B): <5 mg/dL (ref <5)

## 2016-03-14 LAB — TROPONIN I: TROPONIN I: 0.03 ng/mL — AB (ref <0.03)

## 2016-03-14 LAB — DIFFERENTIAL
Basophils Absolute: 0.1 10*3/uL (ref 0–0.1)
Basophils Relative: 1 %
EOS ABS: 0.1 10*3/uL (ref 0–0.7)
EOS PCT: 1 %
LYMPHS ABS: 3.1 10*3/uL (ref 1.0–3.6)
Lymphocytes Relative: 35 %
MONO ABS: 0.8 10*3/uL (ref 0.2–0.9)
MONOS PCT: 9 %
Neutro Abs: 4.7 10*3/uL (ref 1.4–6.5)
Neutrophils Relative %: 54 %

## 2016-03-14 LAB — PROTIME-INR
INR: 1.23
Prothrombin Time: 15.6 seconds — ABNORMAL HIGH (ref 11.4–15.2)

## 2016-03-14 LAB — APTT: aPTT: 32 seconds (ref 24–36)

## 2016-03-14 NOTE — H&P (Signed)
[facility] [place] [facility]   PATIENT NAME: Heidi Miller    MR#:  [id number]  DATE OF BIRTH:  08/07/1922  DATE OF ADMISSION:  03/14/2016  PRIMARY CARE PHYSICIAN: BABAOFF, Lavada MesiMARC E, MD   REQUESTING/REFERRING PHYSICIAN: Shaune PollackLord, MD  CHIEF COMPLAINT:   Chief Complaint  Patient presents with  . Cerebrovascular Accident    Pt. has facial droop on lt. side, change in speech.    HISTORY OF PRESENT ILLNESS:  Heidi Miller  is a 82 y.o. female who presents with Speech change, dysphagia, facial droop. Patient's last normal was 24 hours ago, this morning when her caregiver came in to see her she woke up with these symptoms. She was brought to the ED for evaluation. Initial workup here is largely negative except for a very very mildly elevated troponin. She does have persistent neurological symptoms as described above. Hospitalists were called for admission and further evaluation  PAST MEDICAL HISTORY:   Past Medical History:  Diagnosis Date  . Atrial fibrillation (HCC)   . Dementia   . Hypertension   . Thyroid disease     PAST SURGICAL HISTORY:   Past Surgical History:  Procedure Laterality Date  . CARDIAC CATHETERIZATION    . CHOLECYSTECTOMY      SOCIAL HISTORY:   Social History  Substance Use Topics  . Smoking status: Never Smoker  . Smokeless tobacco: Never Used  . Alcohol use No    FAMILY HISTORY:   Family History  Problem Relation Age of Onset  . Lung cancer Mother   . Heart attack Father     DRUG ALLERGIES:  No Known Allergies  MEDICATIONS AT HOME:   Prior to Admission medications   Medication Sig Start Date End Date Taking? Authorizing Provider  apixaban (ELIQUIS) 2.5 MG TABS tablet Take 2.5 mg by mouth 2 (two) times daily.   Yes Historical Provider, MD  aspirin 81 MG chewable tablet Chew 81 mg by mouth every morning.   Yes Historical Provider, MD  cyanocobalamin (,VITAMIN B-12,) 1000 MCG/ML injection Inject 1,000 mcg into the  muscle every 30 (thirty) days.   Yes Historical Provider, MD  diltiazem (CARDIZEM CD) 120 MG 24 hr capsule Take 120 mg by mouth daily.   Yes Historical Provider, MD  furosemide (LASIX) 20 MG tablet Take 20 mg by mouth daily.   Yes Historical Provider, MD  levothyroxine (SYNTHROID, LEVOTHROID) 100 MCG tablet Take 100 mcg by mouth daily.   Yes Historical Provider, MD  memantine (NAMENDA) 10 MG tablet Take 10 mg by mouth 2 (two) times daily.   Yes Historical Provider, MD  metoprolol (LOPRESSOR) 50 MG tablet Take 25 mg by mouth daily.   Yes Historical Provider, MD  mirtazapine (REMERON) 45 MG tablet Take 22.5 mg by mouth at bedtime.   Yes Historical Provider, MD  omeprazole (PRILOSEC) 20 MG capsule Take 20 mg by mouth daily.   Yes Historical Provider, MD  QUEtiapine (SEROQUEL XR) 50 MG TB24 24 hr tablet Take 50 mg by mouth every evening.   Yes Historical Provider, MD  rivastigmine (EXELON) 1.5 MG capsule Take 1.5 mg by mouth 2 (two) times daily with a meal.   Yes Historical Provider, MD  spironolactone (ALDACTONE) 25 MG tablet Take 12.5 mg by mouth daily.   Yes Historical Provider, MD  venlafaxine XR (EFFEXOR-XR) 150 MG 24 hr capsule Take 150 mg by mouth daily.   Yes Historical Provider, MD  cephALEXin (KEFLEX) 500 MG capsule Take 1 capsule (500  mg total) by mouth 4 (four) times daily. Patient not taking: Reported on 03/14/2016 05/26/15   Delorise Royals Cuthriell, PA-C    REVIEW OF SYSTEMS:  Review of Systems  Constitutional: Negative for chills, fever, malaise/fatigue and weight loss.  HENT: Negative for ear pain, hearing loss and tinnitus.   Eyes: Negative for blurred vision, double vision, pain and redness.  Respiratory: Negative for cough, hemoptysis and shortness of breath.   Cardiovascular: Negative for chest pain, palpitations, orthopnea and leg swelling.  Gastrointestinal: Negative for abdominal pain, constipation, diarrhea, nausea and vomiting.  Genitourinary: Negative for dysuria, frequency  and hematuria.  Musculoskeletal: Negative for back pain, joint pain and neck pain.  Skin:       No acne, rash, or lesions  Neurological: Positive for speech change and focal weakness. Negative for dizziness, tremors and weakness.  Endo/Heme/Allergies: Negative for polydipsia. Does not bruise/bleed easily.  Psychiatric/Behavioral: Negative for depression. The patient is not nervous/anxious and does not have insomnia.      VITAL SIGNS:   Vitals:   03/14/16 2046 03/14/16 2130 03/14/16 2234  BP: 121/77 108/66 106/69  Pulse: 78  92  Resp: 16 17 18   SpO2: 97%  95%  Weight: 63.5 kg (140 lb)    Height: 5\' 3"  (1.6 m)     Wt Readings from Last 3 Encounters:  03/14/16 63.5 kg (140 lb)  05/26/15 68 kg (150 lb)  05/05/15 68 kg (150 lb)    PHYSICAL EXAMINATION:  Physical Exam  Vitals reviewed. Constitutional: She appears well-developed and well-nourished. No distress.  HENT:  Head: Normocephalic and atraumatic.  Mouth/Throat: Oropharynx is clear and moist.  Eyes: Conjunctivae and EOM are normal. Pupils are equal, round, and reactive to light. No scleral icterus.  Neck: Normal range of motion. Neck supple. No JVD present. No thyromegaly present.  Cardiovascular: Intact distal pulses.  Exam reveals no gallop and no friction rub.   No murmur heard. Tachycardic, irregular rhythm  Respiratory: Effort normal and breath sounds normal. No respiratory distress. She has no wheezes. She has no rales.  GI: Soft. Bowel sounds are normal. She exhibits no distension. There is no tenderness.  Musculoskeletal: Normal range of motion. She exhibits no edema.  No arthritis, no gout  Lymphadenopathy:    She has no cervical adenopathy.  Neurological: She is alert. A cranial nerve deficit is present.  Neurologic: Cranial nerves II-XII intact except for right facial droop, Sensation intact to light touch/pinprick, 5/5 strength in all extremities, mild dysarthria, no aphasia, she does report dysphagia, finger  to nose testing showed no abnormality, very mild right pronator drift, DTR intact, Babinski sign not present.   Skin: Skin is warm and dry. No rash noted. No erythema.  Psychiatric: She has a normal mood and affect. Her behavior is normal. Judgment and thought content normal.    LABORATORY PANEL:   CBC  Recent Labs Lab 03/14/16 2054  WBC 8.7  HGB 13.1  HCT 40.4  PLT 244   ------------------------------------------------------------------------------------------------------------------  Chemistries   Recent Labs Lab 03/14/16 2054  NA 141  K 4.0  CL 105  CO2 24  GLUCOSE 209*  BUN 30*  CREATININE 1.42*  CALCIUM 9.6  AST 22  ALT 11*  ALKPHOS 85  BILITOT 0.6   ------------------------------------------------------------------------------------------------------------------  Cardiac Enzymes  Recent Labs Lab 03/14/16 2054  TROPONINI 0.03*   ------------------------------------------------------------------------------------------------------------------  RADIOLOGY:  Ct Head Wo Contrast  Result Date: 03/14/2016 CLINICAL DATA:  Initial evaluation for acute altered mental status. EXAM: CT HEAD  WITHOUT CONTRAST TECHNIQUE: Contiguous axial images were obtained from the base of the skull through the vertex without intravenous contrast. COMPARISON:  Prior CT from 11/24/2012. FINDINGS: Brain: Diffuse prominence of the CSF containing spaces is compatible with generalized age-related cerebral atrophy. Patchy hypodensity within the periventricular and deep white matter both cerebral hemispheres most consistent with chronic small vessel ischemic disease. Small remote left cerebellar infarct noted, stable. Small remote right occipital infarct also noted. No acute intracranial hemorrhage. No evidence for acute large vessel territory infarct. No mass lesion, midline shift, or mass effect. No hydrocephalus. No extra-axial fluid collection. Vascular: Scattered vascular calcifications  within the carotid siphons. No hyperdense vessel. Skull: Scalp soft tissues within normal limits. Patient is status post remote craniotomy on the left. No calvarial fracture. Sinuses/Orbits: The globes and orbits demonstrate no acute abnormality. Paranasal sinuses are clear. Trace opacity within the bilateral mastoid air cells. Other: No other significant finding. IMPRESSION: 1. No acute intracranial process.  The 2. Remote right occipital and left cerebellar infarcts. 3. Generalized age-related cerebral atrophy with mild chronic microvascular ischemic disease. Electronically Signed   By: Rise Mu M.D.   On: 03/14/2016 22:00    EKG:   Orders placed or performed during the hospital encounter of 03/14/16  . ED EKG  . ED EKG  . EKG 12-Lead  . EKG 12-Lead    IMPRESSION AND PLAN:  Principal Problem:   Stroke [facility]) - we will admit her per stroke admission orders set including appropriate imaging, lab tests, and a neurology consult Active Problems:   A-fib (HCC) - continue home rate controlling medications as well as anticoagulation   Dementia - continue home meds   HTN (hypertension) - permissive hypertension for the first 24 hours: Less than 220/120   Hypothyroidism - continue home dose thyroid replacement  All the records are reviewed and case discussed with ED provider. Management plans discussed with the patient and/or family.  DVT PROPHYLAXIS: Systemic anticoagulation  GI PROPHYLAXIS: None  ADMISSION STATUS: Inpatient  CODE STATUS: Full Code Status History    This patient does not have a recorded code status. Please follow your organizational policy for patients in this situation.      TOTAL TIME TAKING CARE OF THIS PATIENT: 45 minutes.    Cordell Coke FIELDING 03/14/2016, 11:10 PM  [facility]  Office  [phone]  CC: Primary care physician; BABAOFF, Lavada Mesi, MD

## 2016-03-14 NOTE — ED Provider Notes (Signed)
[facility] Emergency Department Provider Note ____________________________________________   I have reviewed the triage vital signs and the triage nursing note.  HISTORY  Chief Complaint Cerebrovascular Accident (Pt. has facial droop on lt. side, change in speech.)   Historian Heart caveat, patient's poor historian due to dementia Daughter provides history  HPI Pam DrownMavis W Marletta Miller is a 82 y.o. female with a history dementia, hypertension, and initial fibrillation, who lives alone but has caregivers to come in the mornings. Daughter reports that the caregiver this morning noted that the patient had right facial droop and some slurred speech. This afternoon when the daughter saw her mother she also noted right facial droop. Apparently she was last seen normal yesterday afternoon or evening by the cousin who lives next door. No history of stroke per the family. She does take Eloquis as well as aspirin due to her history of A. fib and coronary artery disease.    Past Medical History:  Diagnosis Date  . Atrial fibrillation (HCC)   . Dementia   . Hypertension   . Thyroid disease     There are no active problems to display for this patient.   Past Surgical History:  Procedure Laterality Date  . CARDIAC CATHETERIZATION    . CHOLECYSTECTOMY      Prior to Admission medications   Medication Sig Start Date End Date Taking? Authorizing Provider  apixaban (ELIQUIS) 2.5 MG TABS tablet Take 2.5 mg by mouth 2 (two) times daily.   Yes Historical Provider, MD  aspirin 81 MG chewable tablet Chew 81 mg by mouth every morning.   Yes Historical Provider, MD  cyanocobalamin (,VITAMIN B-12,) 1000 MCG/ML injection Inject 1,000 mcg into the muscle every 30 (thirty) days.   Yes Historical Provider, MD  diltiazem (CARDIZEM CD) 120 MG 24 hr capsule Take 120 mg by mouth daily.   Yes Historical Provider, MD  furosemide (LASIX) 20 MG tablet Take 20 mg by mouth daily.   Yes Historical  Provider, MD  levothyroxine (SYNTHROID, LEVOTHROID) 100 MCG tablet Take 100 mcg by mouth daily.   Yes Historical Provider, MD  memantine (NAMENDA) 10 MG tablet Take 10 mg by mouth 2 (two) times daily.   Yes Historical Provider, MD  metoprolol (LOPRESSOR) 50 MG tablet Take 25 mg by mouth daily.   Yes Historical Provider, MD  mirtazapine (REMERON) 45 MG tablet Take 22.5 mg by mouth at bedtime.   Yes Historical Provider, MD  omeprazole (PRILOSEC) 20 MG capsule Take 20 mg by mouth daily.   Yes Historical Provider, MD  QUEtiapine (SEROQUEL XR) 50 MG TB24 24 hr tablet Take 50 mg by mouth every evening.   Yes Historical Provider, MD  rivastigmine (EXELON) 1.5 MG capsule Take 1.5 mg by mouth 2 (two) times daily with a meal.   Yes Historical Provider, MD  spironolactone (ALDACTONE) 25 MG tablet Take 12.5 mg by mouth daily.   Yes Historical Provider, MD  venlafaxine XR (EFFEXOR-XR) 150 MG 24 hr capsule Take 150 mg by mouth daily.   Yes Historical Provider, MD  cephALEXin (KEFLEX) 500 MG capsule Take 1 capsule (500 mg total) by mouth 4 (four) times daily. Patient not taking: Reported on 03/14/2016 05/26/15   Delorise RoyalsJonathan D Cuthriell, PA-C    No Known Allergies  History reviewed. No pertinent family history.  Social History Social History  Substance Use Topics  . Smoking status: Never Smoker  . Smokeless tobacco: Never Used  . Alcohol use No    Review of Systems  Constitutional: Negative for recent illnesses. Eyes: Negative for visual changes. ENT: Negative for sore throat. Cardiovascular: Negative for chest pain. Respiratory: Negative for shortness of breath. Gastrointestinal: Negative for abdominal pain, vomiting and diarrhea. Genitourinary: Negative for dysuria. Musculoskeletal: Negative for back pain. Skin: Negative for rash. Neurological: Negative for headache. 10 point Review of Systems otherwise negative ____________________________________________   PHYSICAL EXAM:  VITAL SIGNS: ED  Triage Vitals [03/14/16 2046]  Enc Vitals Group     BP 121/77     Pulse Rate 78     Resp 16     Temp      Temp src      SpO2 97 %     Weight 140 lb (63.5 kg)     Height 5\' 3"  (1.6 m)     Head Circumference      Peak Flow      Pain Score      Pain Loc      Pain Edu?      Excl. in GC?      Constitutional: Alert and Cooperative, but disoriented which is baseline. Well appearing and in no distress. HEENT   Head: Normocephalic and atraumatic.      Eyes: Conjunctivae are normal. PERRL. Normal extraocular movements.      Ears:         Nose: No congestion/rhinnorhea.   Mouth/Throat: Mucous membranes are moist.   Neck: No stridor. Cardiovascular/Chest: Irregularly irregular rhythm between 90 and 110.  No murmurs, rubs, or gallops. Respiratory: Normal respiratory effort without tachypnea nor retractions. Breath sounds are clear and equal bilaterally. No wheezes/rales/rhonchi. Gastrointestinal: Soft. No distention, no guarding, no rebound. Nontender.    Genitourinary/rectal:Deferred Musculoskeletal: Nontender with normal range of motion in all extremities. No joint effusions.  No lower extremity tenderness.  No edema. Neurologic:  No facial droop visible on resting expression, but with testing, she does have lip drooping on the right side. She has some slurred speech.  She is a poor historian and has underlying dementia, but is able to follow commands. Possible mild right-sided pronator drift. No appreciable weakness or motor deficit in upper or lower extremities bilaterally. Denies sensory changes. Skin:  Skin is warm, dry and intact. No rash noted. Psychiatric: No agitation.   ____________________________________________  LABS (pertinent positives/negatives)  Labs Reviewed  PROTIME-INR - Abnormal; Notable for the following:       Result Value   Prothrombin Time 15.6 (*)    All other components within normal limits  CBC - Abnormal; Notable for the following:    RDW 17.8  (*)    All other components within normal limits  COMPREHENSIVE METABOLIC PANEL - Abnormal; Notable for the following:    Glucose, Bld 209 (*)    BUN 30 (*)    Creatinine, Ser 1.42 (*)    ALT 11 (*)    GFR calc non Af Amer 31 (*)    GFR calc Af Amer 36 (*)    All other components within normal limits  TROPONIN I - Abnormal; Notable for the following:    Troponin I 0.03 (*)    All other components within normal limits  ETHANOL  APTT  DIFFERENTIAL    ____________________________________________    EKG I, Governor Rooks, MD, the attending physician have personally viewed and interpreted all ECGs.  104 bpm. Atrial fibrillation. Nonspecific intraventricular conduction delay. Left axis deviation. Nonspecific T-wave ____________________________________________  RADIOLOGY All Xrays were viewed by me. Imaging interpreted by Radiologist.  CT head without contrast:  IMPRESSION: 1. No acute intracranial process.  The 2. Remote right occipital and left cerebellar infarcts. 3. Generalized age-related cerebral atrophy with mild chronic microvascular ischemic disease. __________________________________________  PROCEDURES  Procedure(s) performed: None  Critical Care performed: None  ____________________________________________   ED COURSE / ASSESSMENT AND PLAN  Pertinent labs & imaging results that were available during my care of the patient were reviewed by me and considered in my medical decision making (see chart for details).  Ms. Montez Morita was brought in by her daughter for slurred speech and right-sided facial droop, but she is outside a stroke window as her symptoms started sometime overnight as she was last seen normal yesterday. She is outside the stroke window and a code stroke was not initiated, but workup the same way in terms of CT head and laboratory studies.  She has a history of chronic A. fib, is on Eloquis and aspirin. I'm not adding additional blood thinner at  this point time. CT head was positive for remote infarct, but no acute infarct.  However given a new right-sided facial droop and slurred speech, patient will be admitted to the hospitalist for further evaluation and workup.    CONSULTATIONS:   Hospitalist for admission.   Patient / Family / Caregiver informed of clinical course, medical decision-making process, and agree with plan.    ___________________________________________   FINAL CLINICAL IMPRESSION(S) / ED DIAGNOSES   Final diagnoses:  Facial droop              Note: This dictation was prepared with Dragon dictation. Any transcriptional errors that result from this process are unintentional    Governor Rooks, MD 03/14/16 2324

## 2016-03-14 NOTE — ED Notes (Signed)
Pt comes from home with caregiver during day. When caregiver arrived at 0900 today noticed pt was altered. Pt had right-sided facial droop, and slurred speech. Pt's daughter saw pt at approx 1800, noticed facial droop, slurred speech, and right-sided weakness. Pt's daughter also reports pt leaning more to right side.   Pt has hx of afib and dementia.

## 2016-03-14 NOTE — ED Triage Notes (Signed)
Pt. Daughter states pt. Has dementia.  Pt. Daughter states home health noticed slight difference today in patient.  Daughter states today at around 18:00 change in speech and droop in lt. Side of mouth.

## 2016-03-14 NOTE — ED Notes (Signed)
Patient transported to CT 

## 2016-03-15 ENCOUNTER — Inpatient Hospital Stay: Payer: PPO

## 2016-03-15 ENCOUNTER — Inpatient Hospital Stay
Admit: 2016-03-15 | Discharge: 2016-03-15 | Disposition: A | Discharge status: Home / Self Care | Payer: PPO | Attending: Internal Medicine | Admitting: Internal Medicine

## 2016-03-15 DIAGNOSIS — I639 Cerebral infarction, unspecified: Principal | ICD-10-CM

## 2016-03-15 LAB — LIPID PANEL
CHOLESTEROL: 192 mg/dL (ref 0–200)
HDL: 41 mg/dL (ref >40)
LDL Cholesterol: 101 mg/dL — ABNORMAL HIGH (ref 0–99)
Total CHOL/HDL Ratio: 4.7 RATIO
Triglycerides: 248 mg/dL — ABNORMAL HIGH (ref <150)
VLDL: 50 mg/dL — ABNORMAL HIGH (ref 0–40)

## 2016-03-15 LAB — TROPONIN I

## 2016-03-15 LAB — HEMOGLOBIN A1C: HEMOGLOBIN A1C: 6.9 % — AB (ref 4.0–6.0)

## 2016-03-15 LAB — ECHOCARDIOGRAM COMPLETE
Height: 63 in
WEIGHTICAEL: 2344 [oz_av]

## 2016-03-15 MED ORDER — QUETIAPINE FUMARATE ER 50 MG PO TB24
50.0000 mg | ORAL_TABLET | Freq: Every evening | ORAL | Status: DC
  Administered 2016-03-15 – 2016-03-16 (×2): 50 mg via ORAL
  Filled 2016-03-15 (×2): qty 1

## 2016-03-15 MED ORDER — STROKE: EARLY STAGES OF RECOVERY BOOK
Freq: Once | Status: AC
Start: 2016-03-15 — End: 2016-03-15
  Administered 2016-03-15: 01:00:00

## 2016-03-15 MED ORDER — DILTIAZEM HCL ER COATED BEADS 120 MG PO CP24
120.0000 mg | ORAL_CAPSULE | Freq: Every day | ORAL | Status: DC
  Administered 2016-03-15 – 2016-03-17 (×3): 120 mg via ORAL
  Filled 2016-03-15 (×3): qty 1

## 2016-03-15 MED ORDER — SPIRONOLACTONE 25 MG PO TABS
12.5000 mg | ORAL_TABLET | Freq: Every day | ORAL | Status: DC
  Administered 2016-03-15 – 2016-03-17 (×3): 12.5 mg via ORAL
  Filled 2016-03-15 (×4): qty 1

## 2016-03-15 MED ORDER — APIXABAN 2.5 MG PO TABS
2.5000 mg | ORAL_TABLET | Freq: Two times a day (BID) | ORAL | Status: DC
  Administered 2016-03-15 – 2016-03-17 (×5): 2.5 mg via ORAL
  Filled 2016-03-15 (×5): qty 1

## 2016-03-15 MED ORDER — ASPIRIN 81 MG PO CHEW
81.0000 mg | CHEWABLE_TABLET | ORAL | Status: DC
  Administered 2016-03-16 – 2016-03-17 (×2): 81 mg via ORAL
  Filled 2016-03-15 (×2): qty 1

## 2016-03-15 MED ORDER — METOPROLOL TARTRATE 25 MG PO TABS
25.0000 mg | ORAL_TABLET | Freq: Every day | ORAL | Status: DC
  Administered 2016-03-15 – 2016-03-17 (×3): 25 mg via ORAL
  Filled 2016-03-15 (×3): qty 1

## 2016-03-15 MED ORDER — MEMANTINE HCL 10 MG PO TABS
10.0000 mg | ORAL_TABLET | Freq: Two times a day (BID) | ORAL | Status: DC
  Administered 2016-03-15 – 2016-03-17 (×5): 10 mg via ORAL
  Filled 2016-03-15 (×7): qty 1

## 2016-03-15 MED ORDER — SODIUM CHLORIDE 0.9 % IV SOLN
INTRAVENOUS | Status: DC
  Administered 2016-03-15: 01:00:00 via INTRAVENOUS

## 2016-03-15 MED ORDER — LEVOTHYROXINE SODIUM 100 MCG PO TABS
100.0000 ug | ORAL_TABLET | Freq: Every day | ORAL | Status: DC
  Administered 2016-03-15 – 2016-03-17 (×3): 100 ug via ORAL
  Filled 2016-03-15 (×3): qty 1

## 2016-03-15 MED ORDER — FUROSEMIDE 20 MG PO TABS
20.0000 mg | ORAL_TABLET | Freq: Every day | ORAL | Status: DC
  Administered 2016-03-15 – 2016-03-17 (×3): 20 mg via ORAL
  Filled 2016-03-15 (×3): qty 1

## 2016-03-15 MED ORDER — RIVASTIGMINE TARTRATE 1.5 MG PO CAPS
1.5000 mg | ORAL_CAPSULE | Freq: Two times a day (BID) | ORAL | Status: DC
  Administered 2016-03-15 – 2016-03-17 (×4): 1.5 mg via ORAL
  Filled 2016-03-15 (×4): qty 1

## 2016-03-15 MED ORDER — VENLAFAXINE HCL ER 75 MG PO CP24
150.0000 mg | ORAL_CAPSULE | Freq: Every day | ORAL | Status: DC
  Administered 2016-03-15 – 2016-03-17 (×3): 150 mg via ORAL
  Filled 2016-03-15 (×3): qty 2

## 2016-03-15 MED ORDER — MIRTAZAPINE 15 MG PO TABS
22.5000 mg | ORAL_TABLET | Freq: Every day | ORAL | Status: DC
  Administered 2016-03-15 – 2016-03-16 (×2): 22.5 mg via ORAL
  Filled 2016-03-15: qty 2
  Filled 2016-03-15: qty 1

## 2016-03-15 MED ORDER — PANTOPRAZOLE SODIUM 40 MG PO TBEC
40.0000 mg | DELAYED_RELEASE_TABLET | Freq: Every day | ORAL | Status: DC
  Administered 2016-03-15 – 2016-03-17 (×3): 40 mg via ORAL
  Filled 2016-03-15 (×3): qty 1

## 2016-03-15 MED ORDER — LABETALOL HCL 5 MG/ML IV SOLN
5.0000 mg | INTRAVENOUS | Status: DC | PRN
  Administered 2016-03-15: 5 mg via INTRAVENOUS
  Filled 2016-03-15 (×2): qty 4

## 2016-03-15 NOTE — Evaluation (Signed)
Physical Therapy Evaluation Patient Details Name: Heidi Miller MRN: [id number] DOB: 1922/11/17 Today's Date: 03/15/2016   History of Present Illness  82 y/o female who started having some R sided facial droop and some instability. MRI found Punctate nonhemorrhagic infarct.  Clinical Impression  Pt with changes from baseline regarding cognition, gait (speed and quality) and balance (veering/staggering to the R).  She is able to ambulate to/from the steps with walker with slow, minimally guarded gait.  Pt is motivated to go home where she has intermittent assist, but her safety/balance/cognitive changes would necessitate 24 hour assist at this time.  Pt eager to work with PT and generally did well but is not at her baseline.     Follow Up Recommendations Home health PT;SNF;Supervision/Assistance - 24 hour (per progress)    Equipment Recommendations       Recommendations for Other Services       Precautions / Restrictions Precautions Precautions: Fall Restrictions Weight Bearing Restrictions: No      Mobility  Bed Mobility Overal bed mobility: Needs Assistance Bed Mobility: Supine to Sit     Supine to sit: Min guard     General bed mobility comments: Pt slow to get to EOB needing heavy assist with bed rails and shows some leaning to the R in sitting but able (with great effort) maintain balance  Transfers Overall transfer level: Modified independent Equipment used: Rolling walker (2 wheeled)             General transfer comment: Pt with heavy usage of UEs to get to standing, but able to maintain balance w/o direct assist despite some R lean in standing  Ambulation/Gait Ambulation/Gait assistance: Min assist Ambulation Distance (Feet): 125 Feet Assistive device: Rolling walker (2 wheeled)       General Gait Details: Pt leaning (and to a lesser extent veering) to the R t/o the effort and needs frequent cues to stay within the walker.  She does have multiple small  stagger steps to the R needing light assist to maintain balance.  She walks with slow, guarded cadence that is not at her baseline.    Stairs Stairs: Yes Stairs assistance: Min assist Stair Management: Step to pattern (single HHA) Number of Stairs: 3 General stair comments: Pt is not steady on the stairs, but overall with assist she is able to do a few steps with some minimal safety issues and heavy UE use.  Wheelchair Mobility    Modified Rankin (Stroke Patients Only)       Balance Overall balance assessment: Needs assistance   Sitting balance-Leahy Scale: Fair     Standing balance support: Bilateral upper extremity supported Standing balance-Leahy Scale: Fair Standing balance comment: Pt has a few LOBs (to the R) during ambulation needing assist to stay upright                             Pertinent Vitals/Pain Pain Assessment: No/denies pain    Home Living Family/patient expects to be discharged to:: Private residence Living Arrangements: Alone Available Help at Discharge: Personal care attendant;Family;Available PRN/intermittently   Home Access: Stairs to enter Entrance Stairs-Rails: None Entrance Stairs-Number of Steps: 2   Home Equipment: Walker - 4 wheels;Bedside commode      Prior Function Level of Independence: Independent with assistive device(s)         Comments: Pt able to go out to eat, get hair done, etc - daughter drives her  Hand Dominance        Extremity/Trunk Assessment   Upper Extremity Assessment: Overall WFL for tasks assessed;Generalized weakness (age appropriate deficits, equal bilaterally)           Lower Extremity Assessment: Overall WFL for tasks assessed;Generalized weakness (age appropriate deficits, equal bilaterally)         Communication   Communication:  (moderate confusion)  Cognition Arousal/Alertness: Awake/alert Behavior During Therapy: WFL for tasks assessed/performed Overall Cognitive Status:  History of cognitive impairments - at baseline (has minimal baseline dementia, daughter reports it's worse)                      General Comments      Exercises        Assessment/Plan    PT Assessment Patient needs continued PT services  PT Diagnosis Difficulty walking;Generalized weakness   PT Problem List Decreased strength;Decreased activity tolerance;Decreased balance;Decreased mobility;Decreased cognition;Decreased coordination;Decreased knowledge of use of DME;Decreased safety awareness  PT Treatment Interventions DME instruction;Gait training;Stair training;Functional mobility training;Therapeutic activities;Therapeutic exercise;Balance training;Neuromuscular re-education;Cognitive remediation;Patient/family education   PT Goals (Current goals can be found in the Care Plan section) Acute Rehab PT Goals Patient Stated Goal: get stronger PT Goal Formulation: With patient/family Time For Goal Achievement: 03/29/16 Potential to Achieve Goals: Fair    Frequency 7X/week   Barriers to discharge        Co-evaluation               End of Session Equipment Utilized During Treatment: Gait belt Activity Tolerance: Patient tolerated treatment well Patient left: with chair alarm set;with call bell/phone within reach           Time: 1134-1155 PT Time Calculation (min) (ACUTE ONLY): 21 min   Charges:   PT Evaluation $PT Eval Low Complexity: 1 Procedure     PT G Codes:        Malachi Pro, DPT 03/15/2016, 1:27 PM

## 2016-03-15 NOTE — Progress Notes (Signed)
*  PRELIMINARY RESULTS* Echocardiogram 2D Echocardiogram has been performed.  Heidi Miller 03/15/2016, 11:01 AM

## 2016-03-15 NOTE — Evaluation (Signed)
Occupational Therapy Evaluation Patient Details Name: Heidi Miller MRN: [id number] DOB: Apr 19, 1923 Today's Date: 03/15/2016    History of Present Illness 82 y/o female who started having some R sided facial droop and some instability. MRI found Punctate nonhemorrhagic infarct.   Clinical Impression   Patient was lying in bed when OT arrived. Pleasant and participated well in evaluation. Patient states she lives alone, but has people come into home to assist with cleaning and laundry. States she is able to dress and toilet on her own. States she has just started using a walker over the past month. Patient did state she sometimes needed assist with bathing. Patient able to perform bed mobility with Min guard using bed rails and head of bed slightly elevated. Able to maintain balance sitting edge of bed during reaching tasks. Patient able to stand with rolling walker and support of BUE. Min guard to Min A with standing balance. Posterior lean noted, with decreased balance during ADL tasks in standing when using BUE to perform tasks. Sitting edge of bed, patient was able to donn/doff socks with extra time and Min guard for safety/balance. Patient would benefit from continued OT services to assist with general strengthening and balance for increased independence with ADL tasks.    Follow Up Recommendations  Home health OT    Equipment Recommendations       Recommendations for Other Services PT consult;Speech consult     Precautions / Restrictions Precautions Precautions: Fall Restrictions Weight Bearing Restrictions: No      Mobility Bed Mobility Overal bed mobility: Needs Assistance Bed Mobility: Supine to Sit     Supine to sit: Min guard     General bed mobility comments: Pt slow to get to EOB needing heavy assist with bed rails. Sat EOB and able to maintain balance with some UE support.  Transfers Overall transfer level: Needs assistance Equipment used: Rolling walker (2  wheeled)             General transfer comment: Pt with heavy usage of UEs to get to standing. Able to maintain balance with BUE hands on walker, but posterior lean noted. Min Guard to [employer] A when attempted unsupported standing.    Balance                                            ADL Overall ADL's : Needs assistance/impaired                     Lower Body Dressing: Minimal assistance   Toilet Transfer: Minimal assistance           Functional mobility during ADLs: Minimal assistance       Vision     Perception     Praxis      Pertinent Vitals/Pain Pain Assessment: No/denies pain     Hand Dominance Right   Extremity/Trunk Assessment Upper Extremity Assessment Upper Extremity Assessment: Overall WFL for tasks assessed;Generalized weakness   Lower Extremity Assessment Lower Extremity Assessment: Overall WFL for tasks assessed;Generalized weakness       Communication     Cognition Arousal/Alertness: Awake/alert Behavior During Therapy: WFL for tasks assessed/performed Overall Cognitive Status: History of cognitive impairments - at baseline       Memory: Decreased short-term memory             General Comments  Exercises       Shoulder Instructions      Home Living Family/patient expects to be discharged to:: Private residence Living Arrangements: Alone Available Help at Discharge: Personal care attendant;Family;Available PRN/intermittently   Home Access: Stairs to enter Entrance Stairs-Number of Steps: 2 Entrance Stairs-Rails: None                 Home Equipment: Walker - 4 wheels;Bedside commode          Prior Functioning/Environment Level of Independence: Independent with assistive device(s)        Comments: Pt able to go out to eat, get hair done, etc - daughter drives her    OT Diagnosis: Generalized weakness;Cognitive deficits   OT Problem List: Impaired balance (sitting and/or  standing);Decreased safety awareness   OT Treatment/Interventions: Self-care/ADL training;Patient/family education;Therapeutic activities;DME and/or AE instruction    OT Goals(Current goals can be found in the care plan section) Acute Rehab OT Goals Patient Stated Goal: go home OT Goal Formulation: With patient Time For Goal Achievement: 03/29/16 Potential to Achieve Goals: Good  OT Frequency: Min 1X/week   Barriers to D/C:            Co-evaluation              End of Session Equipment Utilized During Treatment: Gait belt;Rolling walker  Activity Tolerance: Patient tolerated treatment well Patient left: in bed;with call bell/phone within reach;with bed alarm set   Time: [id number] OT Time Calculation (min): 19 min Charges:  OT General Charges $OT Visit: 1 Procedure OT Evaluation $OT Eval Low Complexity: 1 Procedure G-Codes:    Clois Montavon L 03/15/2016, 4:41 PM Kirstie PeriWendy Verdis Koval, OTR/L

## 2016-03-15 NOTE — Progress Notes (Signed)
[facility] - [place] at [facility]                                                                                                                                                                                            Patient Demographics   Heidi Miller, is a 82 y.o. female, DOB - 1923-03-02, NWG:[id number]  Admit date - 03/14/2016   Admitting Physician Oralia Manis, MD  Outpatient Primary MD for the patient is BABAOFF, MARC E, MD   LOS - 1  Subjective: Patient admitted with right-sided facial droop and slurred speech facial droop persist Does have dementia    Review of Systems:   CONSTITUTIONAL: No documented fever. No fatigue, weakness. No weight gain, no weight loss.  EYES: No blurry or double vision.  ENT: No tinnitus. No postnasal drip. No redness of the oropharynx.  RESPIRATORY: No cough, no wheeze, no hemoptysis. No dyspnea.  CARDIOVASCULAR: No chest pain. No orthopnea. No palpitations. No syncope.  GASTROINTESTINAL: No nausea, no vomiting or diarrhea. No abdominal pain. No melena or hematochezia.  GENITOURINARY: No dysuria or hematuria.  ENDOCRINE: No polyuria or nocturia. No heat or cold intolerance.  HEMATOLOGY: No anemia. No bruising. No bleeding.  INTEGUMENTARY: No rashes. No lesions.  MUSCULOSKELETAL: No arthritis. No swelling. No gout.  NEUROLOGICPositive for facial droop on the right PSYCHIATRIC: No anxiety. No insomnia. No ADD.    Vitals:   Vitals:   03/15/16 0326 03/15/16 0517 03/15/16 0739 03/15/16 1217  BP: 91/68 101/72 101/69 96/71  Pulse: (!) 138 (!) 118 71 77  Resp: 18  18 16   Temp: 97.9 F (36.6 C) 98.1 F (36.7 C) 98.5 F (36.9 C)   TempSrc: Oral Oral Oral   SpO2: 96% 100% 96% 98%  Weight:      Height:        Wt Readings from Last 3 Encounters:  03/15/16 146 lb 8 oz (66.5 kg)  05/26/15 150 lb (68 kg)  05/05/15 150 lb (68 kg)     Intake/Output Summary (Last 24 hours) at 03/15/16 1354 Last data filed at  03/15/16 0332  Gross per 24 hour  Intake            162.5 ml  Output                0 ml  Net            162.5 ml    Physical Exam:   GENERAL: Pleasant-appearing in no apparent distress.  HEAD, EYES, EARS, NOSE AND THROAT: Atraumatic, normocephalic. Extraocular muscles are intact. Pupils equal and reactive to light. Sclerae anicteric. No  conjunctival injection. No oro-pharyngeal erythema.  NECK: Supple. There is no jugular venous distention. No bruits, no lymphadenopathy, no thyromegaly.  HEART: Regular rate and rhythm,. No murmurs, no rubs, no clicks.  LUNGS: Clear to auscultation bilaterally. No rales or rhonchi. No wheezes.  ABDOMEN: Soft, flat, nontender, nondistended. Has good bowel sounds. No hepatosplenomegaly appreciated.  EXTREMITIES: No evidence of any cyanosis, clubbing, or peripheral edema.  +2 pedal and radial pulses bilaterally.  NEUROLOGIC: The patient is alert, awake, and oriented  X 2  Right sided facial droop SKIN: Moist and warm with no rashes appreciated.  Psych: Not anxious, depressed LN: No inguinal LN enlargement    Antibiotics   Anti-infectives    None      Medications   Scheduled Meds: . apixaban  2.5 mg Oral BID  . aspirin  81 mg Oral BH-q7a  . diltiazem  120 mg Oral Daily  . furosemide  20 mg Oral Daily  . levothyroxine  100 mcg Oral QAC breakfast  . memantine  10 mg Oral BID  . metoprolol  25 mg Oral Daily  . mirtazapine  22.5 mg Oral QHS  . pantoprazole  40 mg Oral Daily  . spironolactone  12.5 mg Oral Daily  . venlafaxine XR  150 mg Oral Daily   Continuous Infusions:  PRN Meds:.labetalol   Data Review:   Micro Results No results found for this or any previous visit (from the past 240 hour(s)).  Radiology Reports Dg Chest 2 View  Result Date: 03/15/2016 CLINICAL DATA:  Right facial droop, possible stroke EXAM: CHEST  2 VIEW COMPARISON:  04/29/2014 FINDINGS: Cardiomegaly again noted. No acute infiltrate or pleural effusion. No  pulmonary edema. Atherosclerotic calcifications of thoracic aorta. Osteopenia and mild degenerative changes thoracic spine. Interval mild compression deformity upper lumbar spine of indeterminate age. Clinical correlation is necessary. IMPRESSION: No active cardiopulmonary disease. Cardiomegaly again noted. Interval mild compression deformity upper lumbar spine of indeterminate age. Clinical correlation is necessary. Electronically Signed   By: Natasha MeadLiviu  Pop M.D.   On: 03/15/2016 11:02   Ct Head Wo Contrast  Result Date: 03/14/2016 CLINICAL DATA:  Initial evaluation for acute altered mental status. EXAM: CT HEAD WITHOUT CONTRAST TECHNIQUE: Contiguous axial images were obtained from the base of the skull through the vertex without intravenous contrast. COMPARISON:  Prior CT from 11/24/2012. FINDINGS: Brain: Diffuse prominence of the CSF containing spaces is compatible with generalized age-related cerebral atrophy. Patchy hypodensity within the periventricular and deep white matter both cerebral hemispheres most consistent with chronic small vessel ischemic disease. Small remote left cerebellar infarct noted, stable. Small remote right occipital infarct also noted. No acute intracranial hemorrhage. No evidence for acute large vessel territory infarct. No mass lesion, midline shift, or mass effect. No hydrocephalus. No extra-axial fluid collection. Vascular: Scattered vascular calcifications within the carotid siphons. No hyperdense vessel. Skull: Scalp soft tissues within normal limits. Patient is status post remote craniotomy on the left. No calvarial fracture. Sinuses/Orbits: The globes and orbits demonstrate no acute abnormality. Paranasal sinuses are clear. Trace opacity within the bilateral mastoid air cells. Other: No other significant finding. IMPRESSION: 1. No acute intracranial process.  The 2. Remote right occipital and left cerebellar infarcts. 3. Generalized age-related cerebral atrophy with mild chronic  microvascular ischemic disease. Electronically Signed   By: Rise MuBenjamin  McClintock M.D.   On: 03/14/2016 22:00   Mr Brain Wo Contrast  Result Date: 03/15/2016 CLINICAL DATA:  The patient awoke with abnormal speech, dysphagia, and right facial droop yesterday.  EXAM: MRI HEAD WITHOUT CONTRAST MRA HEAD WITHOUT CONTRAST TECHNIQUE: Multiplanar, multiecho pulse sequences of the brain and surrounding structures were obtained without intravenous contrast. Angiographic images of the head were obtained using MRA technique without contrast. COMPARISON:  None. CT head without contrast 03/14/2016 FINDINGS: MRI HEAD FINDINGS Brain: The diffusion-weighted images demonstrate an acute/ subacute infarct involving the posterior limb of the left internal capsule. Infarct measures 7 mm maximally. Subtle T2 changes are associated with the infarct, compatible with a subacute time frame. No additional infarcts are present. Multiple additional remote lacunar infarcts are scattered through the basal ganglia and thalami bilaterally. Advanced atrophy and diffuse white matter disease is present bilaterally. White matter changes extend into the brainstem. There are remote lacunar infarcts in the cerebellum bilaterally. The internal auditory canals are within normal limits. Vascular: Flow is present in the major intracranial arteries. Skull and upper cervical spine: The skullbase is within normal limits. Midline sagittal structures are unremarkable. Mild degenerative changes are present at C3-4. Sinuses/Orbits: Bilateral lens replacements are present. The globes and orbits are intact. The paranasal sinuses and mastoid air cells are clear. MRA HEAD FINDINGS Internal carotid arteries are within normal limits from the high cervical segments through the ICA termini bilaterally. A fetal type left posterior cerebral artery is present. There is moderate narrowing in the distal left A1 segment. The right is 1 segment is intact. The M1 segments are  within normal limits bilaterally. There is marked attenuation of MCA branch vessels bilaterally. High-grade stenosis or occlusion is present in the inferior right M2 segment. The posterior inferior left M2 segment is not visualized. There is a high-grade stenosis in the residual anterior division. Signal loss in the left vertebral artery at the skullbase is likely artifactual. The right vertebral artery is not visualized. It is either hypoplastic or occluded. The basilar artery is within normal limits. The right posterior cerebral artery originates from the basilar tip. A small left P1 segment is present. The left posterior cerebral artery is of fetal type. There is marked attenuation of distal PCA branch vessels bilaterally. IMPRESSION: 1. Punctate nonhemorrhagic infarct within the posterior limb of the left internal capsule, corresponding with the patient's right-sided facial droop and slurred speech. 2. Multiple remote lacunar infarcts throughout the basal ganglia and bilateral cerebellum. 3. Advanced atrophy and diffuse white matter disease likely reflects the sequela of chronic microvascular ischemia. 4. No other acute infarct. 5. Extensive medium and distal small vessel disease throughout the circle of Willis. 6. Moderate stenosis in the left A1 segment. 7. Marked attenuation of proximal M2 segments bilaterally. 8. Moderate stenosis of proximal posterior cerebral arteries bilaterally. 9. The left posterior cerebral artery is of fetal type. Electronically Signed   By: Marin Roberts M.D.   On: 03/15/2016 10:48   US Carotid Bilateral  Result Date: 03/15/2016 CLINICAL DATA:  Stroke.  History of hypertension and CAD. EXAM: BILATERAL CAROTID DUPLEX ULTRASOUND TECHNIQUE: Wallace Cullens scale imaging, color Doppler and duplex ultrasound were performed of bilateral carotid and vertebral arteries in the neck. COMPARISON:  None. FINDINGS: Criteria: Quantification of carotid stenosis is based on velocity parameters that  correlate the residual internal carotid diameter with NASCET-based stenosis levels, using the diameter of the distal internal carotid lumen as the denominator for stenosis measurement. The following velocity measurements were obtained: RIGHT ICA:  30/8 cm/sec CCA:  66/7 cm/sec SYSTOLIC ICA/CCA RATIO:  0.5 DIASTOLIC ICA/CCA RATIO:  1.1 ECA:  125 cm/sec LEFT ICA:  37/12 cm/sec CCA:  55/10 cm/sec SYSTOLIC ICA/CCA  RATIO:  0.7 DIASTOLIC ICA/CCA RATIO:  1.2 ECA:  55 cm/sec RIGHT CAROTID ARTERY: There is a minimal amount of eccentric mixed echogenic plaque within the right carotid bulb (images 21 and 22), extending to involve the origin and proximal aspect the right internal carotid artery (image 29), not resulting in elevated peak systolic velocities within the interrogated course of the right internal carotid artery to suggest a hemodynamically significant stenosis. RIGHT VERTEBRAL ARTERY:  Antegrade Flow LEFT CAROTID ARTERY: There is a minimal amount of eccentric mixed echogenic plaque within distal aspect the left common carotid artery (image 50), extending to involve the left carotid bulb (53). There is a minimal to moderate amount of eccentric mixed echogenic plaque involving the origin and proximal aspects of the left internal carotid artery (image 60) LEFT VERTEBRAL ARTERY:  Antegrade flow IMPRESSION: Minimal to moderate amount of bilateral atherosclerotic plaque, left subjectively greater than right, not resulting in a hemodynamically significant stenosis within either internal carotid artery. Electronically Signed   By: Simonne Come M.D.   On: 03/15/2016 09:46   Mr Maxine Glenn Head/brain ZO Cm  Result Date: 03/15/2016 CLINICAL DATA:  The patient awoke with abnormal speech, dysphagia, and right facial droop yesterday. EXAM: MRI HEAD WITHOUT CONTRAST MRA HEAD WITHOUT CONTRAST TECHNIQUE: Multiplanar, multiecho pulse sequences of the brain and surrounding structures were obtained without intravenous contrast. Angiographic  images of the head were obtained using MRA technique without contrast. COMPARISON:  None. CT head without contrast 03/14/2016 FINDINGS: MRI HEAD FINDINGS Brain: The diffusion-weighted images demonstrate an acute/ subacute infarct involving the posterior limb of the left internal capsule. Infarct measures 7 mm maximally. Subtle T2 changes are associated with the infarct, compatible with a subacute time frame. No additional infarcts are present. Multiple additional remote lacunar infarcts are scattered through the basal ganglia and thalami bilaterally. Advanced atrophy and diffuse white matter disease is present bilaterally. White matter changes extend into the brainstem. There are remote lacunar infarcts in the cerebellum bilaterally. The internal auditory canals are within normal limits. Vascular: Flow is present in the major intracranial arteries. Skull and upper cervical spine: The skullbase is within normal limits. Midline sagittal structures are unremarkable. Mild degenerative changes are present at C3-4. Sinuses/Orbits: Bilateral lens replacements are present. The globes and orbits are intact. The paranasal sinuses and mastoid air cells are clear. MRA HEAD FINDINGS Internal carotid arteries are within normal limits from the high cervical segments through the ICA termini bilaterally. A fetal type left posterior cerebral artery is present. There is moderate narrowing in the distal left A1 segment. The right is 1 segment is intact. The M1 segments are within normal limits bilaterally. There is marked attenuation of MCA branch vessels bilaterally. High-grade stenosis or occlusion is present in the inferior right M2 segment. The posterior inferior left M2 segment is not visualized. There is a high-grade stenosis in the residual anterior division. Signal loss in the left vertebral artery at the skullbase is likely artifactual. The right vertebral artery is not visualized. It is either hypoplastic or occluded. The  basilar artery is within normal limits. The right posterior cerebral artery originates from the basilar tip. A small left P1 segment is present. The left posterior cerebral artery is of fetal type. There is marked attenuation of distal PCA branch vessels bilaterally. IMPRESSION: 1. Punctate nonhemorrhagic infarct within the posterior limb of the left internal capsule, corresponding with the patient's right-sided facial droop and slurred speech. 2. Multiple remote lacunar infarcts throughout the basal ganglia and bilateral cerebellum.  3. Advanced atrophy and diffuse white matter disease likely reflects the sequela of chronic microvascular ischemia. 4. No other acute infarct. 5. Extensive medium and distal small vessel disease throughout the circle of Willis. 6. Moderate stenosis in the left A1 segment. 7. Marked attenuation of proximal M2 segments bilaterally. 8. Moderate stenosis of proximal posterior cerebral arteries bilaterally. 9. The left posterior cerebral artery is of fetal type. Electronically Signed   By: Marin Roberts M.D.   On: 03/15/2016 10:48     CBC  Recent Labs Lab 03/14/16 2054  WBC 8.7  HGB 13.1  HCT 40.4  PLT 244  MCV 83.7  MCH 27.1  MCHC 32.4  RDW 17.8*  LYMPHSABS 3.1  MONOABS 0.8  EOSABS 0.1  BASOSABS 0.1    Chemistries   Recent Labs Lab 03/14/16 2054  NA 141  K 4.0  CL 105  CO2 24  GLUCOSE 209*  BUN 30*  CREATININE 1.42*  CALCIUM 9.6  AST 22  ALT 11*  ALKPHOS 85  BILITOT 0.6   ------------------------------------------------------------------------------------------------------------------ estimated creatinine clearance is 23.6 mL/min (by C-G formula based on SCr of 1.42 mg/dL). ------------------------------------------------------------------------------------------------------------------  Recent Labs  03/15/16 0131  HGBA1C 6.9*    ------------------------------------------------------------------------------------------------------------------  Recent Labs  03/15/16 0131  CHOL 192  HDL 41  LDLCALC 101*  TRIG 248*  CHOLHDL 4.7   ------------------------------------------------------------------------------------------------------------------ No results for input(s): TSH, T4TOTAL, T3FREE, THYROIDAB in the last 72 hours.  Invalid input(s): FREET3 ------------------------------------------------------------------------------------------------------------------ No results for input(s): VITAMINB12, FOLATE, FERRITIN, TIBC, IRON, RETICCTPCT in the last 72 hours.  Coagulation profile  Recent Labs Lab 03/14/16 2054  INR 1.23    No results for input(s): DDIMER in the last 72 hours.  Cardiac Enzymes  Recent Labs Lab 03/14/16 2054 03/15/16 0131 03/15/16 0653  TROPONINI 0.03* <0.03 <0.03   ------------------------------------------------------------------------------------------------------------------ Invalid input(s): POCBNP    Assessment & Plan   Patient is a 82 year old with acute CVA  1. Acute CVA His artery on low-dose aspirin and eliquis Seen by PT recommends home physical therapy also seen by speech She is noted to have arthrosclerotic disease in the brain as well as carotid artery. Medical management   2. Atrial fibrillation continue therapy with Royetta Crochet and metoprolol   3 hypertension continue therapy with diltiazem and metoprolol  4. Dementia continue Effexor Resume Exelon and Seroquel 5. Miscellaneous artery on Royetta Crochet for DVT prophylaxis    Code Status Orders        Start     Ordered   03/15/16 0109  Full code  Continuous     03/15/16 0108    Code Status History    Date Active Date Inactive Code Status Order ID Comments User Context   03/15/2016  1:08 AM 03/15/2016 10:36 AM Full Code [id number]  Oralia Manis, MD Inpatient    Advance Directive Documentation    Flowsheet Row Most Recent Value  Type of Advance Directive  Healthcare Power of Attorney  Pre-existing out of facility DNR order (yellow form or pink MOST form)  No data  "MOST" Form in Place?  No data           Consults  Neurology  DVT Prophylaxis eliqis  Lab Results  Component Value Date   PLT 244 03/14/2016     Time Spent in minutes  Greater than 50% of time spent in care coordination and counseling patient regarding the condition and plan of care.   Auburn Bilberry M.D on 03/15/2016 at 1:54 PM  Between 7am to  6pm - Pager - [phone]  After 6pm go to [email or web address] - password [email or web address] [facility] [facility]   Office  [phone]

## 2016-03-15 NOTE — Consult Note (Signed)
Referring Physician: Allena Katz    Chief Complaint: Facial droop and slurred speech  HPI: Heidi Miller is an 82 y.o. female with a history of atrial fibrillation on Eliquis and ASA who was noted by daughter and caregivers to have facial droop and slurred speech on yesterday.  Her LKW is unclear but appears to have been at some time on yesterday.  With no improvement in symptoms patient was brought in for evaluation.    Date last known well: Date: 03/13/2016 Time last known well: Unable to determine tPA Given: No: On Eliquis, unclear LKW  Past Medical History:  Diagnosis Date  . Atrial fibrillation (HCC)   . Dementia   . Hypertension   . Thyroid disease     Past Surgical History:  Procedure Laterality Date  . CARDIAC CATHETERIZATION    . CHOLECYSTECTOMY      Family History  Problem Relation Age of Onset  . Lung cancer Mother   . Heart attack Father    Social History:  reports that she has never smoked. She has never used smokeless tobacco. She reports that she does not drink alcohol or use drugs.  Allergies: No Known Allergies  Medications:  I have reviewed the patient's current medications. Prior to Admission:  Prescriptions Prior to Admission  Medication Sig Dispense Refill Last Dose  . apixaban (ELIQUIS) 2.5 MG TABS tablet Take 2.5 mg by mouth 2 (two) times daily.   03/14/2016 at Unknown time  . aspirin 81 MG chewable tablet Chew 81 mg by mouth every morning.     . cyanocobalamin (,VITAMIN B-12,) 1000 MCG/ML injection Inject 1,000 mcg into the muscle every 30 (thirty) days.   Past Month at Unknown time  . diltiazem (CARDIZEM CD) 120 MG 24 hr capsule Take 120 mg by mouth daily.   03/14/2016 at Unknown time  . furosemide (LASIX) 20 MG tablet Take 20 mg by mouth daily.   03/14/2016 at Unknown time  . levothyroxine (SYNTHROID, LEVOTHROID) 100 MCG tablet Take 100 mcg by mouth daily.   03/14/2016 at Unknown time  . memantine (NAMENDA) 10 MG tablet Take 10 mg by mouth 2 (two) times daily.    03/14/2016 at Unknown time  . metoprolol (LOPRESSOR) 50 MG tablet Take 25 mg by mouth daily.   03/14/2016 at Unknown time  . mirtazapine (REMERON) 45 MG tablet Take 22.5 mg by mouth at bedtime.   03/13/2016 at Unknown time  . omeprazole (PRILOSEC) 20 MG capsule Take 20 mg by mouth daily.   03/14/2016 at Unknown time  . QUEtiapine (SEROQUEL XR) 50 MG TB24 24 hr tablet Take 50 mg by mouth every evening.   03/14/2016 at Unknown time  . rivastigmine (EXELON) 1.5 MG capsule Take 1.5 mg by mouth 2 (two) times daily with a meal.   03/14/2016 at Unknown time  . spironolactone (ALDACTONE) 25 MG tablet Take 12.5 mg by mouth daily.   03/14/2016 at Unknown time  . venlafaxine XR (EFFEXOR-XR) 150 MG 24 hr capsule Take 150 mg by mouth daily.   03/14/2016 at Unknown time  . cephALEXin (KEFLEX) 500 MG capsule Take 1 capsule (500 mg total) by mouth 4 (four) times daily. (Patient not taking: Reported on 03/14/2016) 28 capsule 0 Not Taking at Unknown time   Scheduled: . apixaban  2.5 mg Oral BID  . aspirin  81 mg Oral [id number]  . diltiazem  120 mg Oral Daily  . furosemide  20 mg Oral Daily  . levothyroxine  100 mcg Oral QAC breakfast  .  memantine  10 mg Oral BID  . metoprolol  25 mg Oral Daily  . mirtazapine  22.5 mg Oral QHS  . pantoprazole  40 mg Oral Daily  . spironolactone  12.5 mg Oral Daily  . venlafaxine XR  150 mg Oral Daily    ROS: History obtained from the patient  General ROS: negative for - chills, fatigue, fever, night sweats, weight gain or weight loss Psychological ROS: negative for - behavioral disorder, hallucinations, memory difficulties, mood swings or suicidal ideation Ophthalmic ROS: negative for - blurry vision, double vision, eye pain or loss of vision ENT ROS: negative for - epistaxis, nasal discharge, oral lesions, sore throat, tinnitus or vertigo Allergy and Immunology ROS: negative for - hives or itchy/watery eyes Hematological and Lymphatic ROS: negative for - bleeding problems, bruising or  swollen lymph nodes Endocrine ROS: negative for - galactorrhea, hair pattern changes, polydipsia/polyuria or temperature intolerance Respiratory ROS: negative for - cough, hemoptysis, shortness of breath or wheezing Cardiovascular ROS: negative for - chest pain, dyspnea on exertion, edema or irregular heartbeat Gastrointestinal ROS: negative for - abdominal pain, diarrhea, hematemesis, nausea/vomiting or stool incontinence Genito-Urinary ROS: negative for - dysuria, hematuria, incontinence or urinary frequency/urgency Musculoskeletal ROS: negative for - joint swelling or muscular weakness Neurological ROS: as noted in HPI Dermatological ROS: negative for rash and skin lesion changes  Physical Examination: Blood pressure 101/69, pulse 71, temperature 98.5 F (36.9 C), temperature source Oral, resp. rate 18, height 5\' 3"  (1.6 m), weight 66.5 kg (146 lb 8 oz), SpO2 96 %.  HEENT-  Normocephalic, no lesions, without obvious abnormality.  Normal external eye and conjunctiva.  Normal TM's bilaterally.  Normal auditory canals and external ears. Normal external nose, mucus membranes and septum.  Normal pharynx. Cardiovascular- S1, S2 normal, pulses palpable throughout   Lungs- chest clear, no wheezing, rales, normal symmetric air entry Abdomen- soft, non-tender; bowel sounds normal; no masses,  no organomegaly Extremities- no edema Lymph-no adenopathy palpable Musculoskeletal-no joint tenderness, deformity or swelling Skin-warm and dry, no hyperpigmentation, vitiligo, or suspicious lesions  Neurological Examination Mental Status: Alert, oriented to name and place, thought content appropriate.  Patient with some minor word finding deficits and slurring of speech.  Able to follow simple commands without difficulty but requires some reinforcement for 3-step commands. Cranial Nerves: II: Discs flat bilaterally; Visual fields grossly normal, pupils equal, round, reactive to light and  accommodation III,IV, VI: ptosis not present, extra-ocular motions intact bilaterally V,VII: mild right facial droop, facial light touch sensation normal bilaterally VIII: hearing normal bilaterally IX,X: gag reflex present XI: bilateral shoulder shrug XII: midline tongue extension Motor: Right : Upper extremity   4+/5    Left:     Upper extremity   5/5  Lower extremity   5/5     Lower extremity   5/5 Tone and bulk:normal tone throughout; no atrophy noted Sensory: Pinprick and light touch intact throughout, bilaterally Deep Tendon Reflexes: 2+ in the upper extremities, trace at the knees and absent at the ankles Plantars: Right: downgoing   Left: downgoing Cerebellar: Normal finger-to-nose and normal heel-to-shin testing bilaterally Gait: not tested due to safety concerns      Laboratory Studies:  Basic Metabolic Panel:  Recent Labs Lab 03/14/16 2054  NA 141  K 4.0  CL 105  CO2 24  GLUCOSE 209*  BUN 30*  CREATININE 1.42*  CALCIUM 9.6    Liver Function Tests:  Recent Labs Lab 03/14/16 2054  AST 22  ALT 11*  ALKPHOS  85  BILITOT 0.6  PROT 7.3  ALBUMIN 4.1   No results for input(s): LIPASE, AMYLASE in the last 168 hours. No results for input(s): AMMONIA in the last 168 hours.  CBC:  Recent Labs Lab 03/14/16 2054  WBC 8.7  NEUTROABS 4.7  HGB 13.1  HCT 40.4  MCV 83.7  PLT 244    Cardiac Enzymes:  Recent Labs Lab 03/14/16 2054 03/15/16 0131 03/15/16 0653  TROPONINI 0.03* <0.03 <0.03    BNP: Invalid input(s): POCBNP  CBG: No results for input(s): GLUCAP in the last 168 hours.  Microbiology: Results for orders placed or performed during the hospital encounter of 05/05/15  Urine culture     Status: None   Collection Time: 05/05/15  3:25 PM  Result Value Ref Range Status   Specimen Description URINE, RANDOM  Final   Special Requests NONE  Final   Culture MULTIPLE SPECIES PRESENT, SUGGEST RECOLLECTION  Final   Report Status 05/06/2015  FINAL  Final    Coagulation Studies:  Recent Labs  03/14/16 2054  LABPROT 15.6*  INR 1.23    Urinalysis: No results for input(s): COLORURINE, LABSPEC, PHURINE, GLUCOSEU, HGBUR, BILIRUBINUR, KETONESUR, PROTEINUR, UROBILINOGEN, NITRITE, LEUKOCYTESUR in the last 168 hours.  Invalid input(s): APPERANCEUR  Lipid Panel:    Component Value Date/Time   CHOL 192 03/15/2016 0131   CHOL 109 04/30/2014 0530   TRIG 248 (H) 03/15/2016 0131   TRIG 123 04/30/2014 0530   HDL 41 03/15/2016 0131   HDL 37 (L) 04/30/2014 0530   CHOLHDL 4.7 03/15/2016 0131   VLDL 50 (H) 03/15/2016 0131   VLDL 25 04/30/2014 0530   LDLCALC 101 (H) 03/15/2016 0131   LDLCALC 47 04/30/2014 0530    HgbA1C:  Lab Results  Component Value Date   HGBA1C 6.9 (H) 03/15/2016    Urine Drug Screen:  No results found for: LABOPIA, COCAINSCRNUR, LABBENZ, AMPHETMU, THCU, LABBARB  Alcohol Level:  Recent Labs Lab 03/14/16 2054  ETH <5    Other results: EKG: atrial fibrillation, rate 104bpm.  Imaging: Dg Chest 2 View  Result Date: 03/15/2016 CLINICAL DATA:  Right facial droop, possible stroke EXAM: CHEST  2 VIEW COMPARISON:  04/29/2014 FINDINGS: Cardiomegaly again noted. No acute infiltrate or pleural effusion. No pulmonary edema. Atherosclerotic calcifications of thoracic aorta. Osteopenia and mild degenerative changes thoracic spine. Interval mild compression deformity upper lumbar spine of indeterminate age. Clinical correlation is necessary. IMPRESSION: No active cardiopulmonary disease. Cardiomegaly again noted. Interval mild compression deformity upper lumbar spine of indeterminate age. Clinical correlation is necessary. Electronically Signed   By: Natasha MeadLiviu  Pop M.D.   On: 03/15/2016 11:02   Ct Head Wo Contrast  Result Date: 03/14/2016 CLINICAL DATA:  Initial evaluation for acute altered mental status. EXAM: CT HEAD WITHOUT CONTRAST TECHNIQUE: Contiguous axial images were obtained from the base of the skull through the  vertex without intravenous contrast. COMPARISON:  Prior CT from 11/24/2012. FINDINGS: Brain: Diffuse prominence of the CSF containing spaces is compatible with generalized age-related cerebral atrophy. Patchy hypodensity within the periventricular and deep white matter both cerebral hemispheres most consistent with chronic small vessel ischemic disease. Small remote left cerebellar infarct noted, stable. Small remote right occipital infarct also noted. No acute intracranial hemorrhage. No evidence for acute large vessel territory infarct. No mass lesion, midline shift, or mass effect. No hydrocephalus. No extra-axial fluid collection. Vascular: Scattered vascular calcifications within the carotid siphons. No hyperdense vessel. Skull: Scalp soft tissues within normal limits. Patient is status post remote craniotomy  on the left. No calvarial fracture. Sinuses/Orbits: The globes and orbits demonstrate no acute abnormality. Paranasal sinuses are clear. Trace opacity within the bilateral mastoid air cells. Other: No other significant finding. IMPRESSION: 1. No acute intracranial process.  The 2. Remote right occipital and left cerebellar infarcts. 3. Generalized age-related cerebral atrophy with mild chronic microvascular ischemic disease. Electronically Signed   By: Rise Mu M.D.   On: 03/14/2016 22:00   Mr Brain Wo Contrast  Result Date: 03/15/2016 CLINICAL DATA:  The patient awoke with abnormal speech, dysphagia, and right facial droop yesterday. EXAM: MRI HEAD WITHOUT CONTRAST MRA HEAD WITHOUT CONTRAST TECHNIQUE: Multiplanar, multiecho pulse sequences of the brain and surrounding structures were obtained without intravenous contrast. Angiographic images of the head were obtained using MRA technique without contrast. COMPARISON:  None. CT head without contrast 03/14/2016 FINDINGS: MRI HEAD FINDINGS Brain: The diffusion-weighted images demonstrate an acute/ subacute infarct involving the posterior limb  of the left internal capsule. Infarct measures 7 mm maximally. Subtle T2 changes are associated with the infarct, compatible with a subacute time frame. No additional infarcts are present. Multiple additional remote lacunar infarcts are scattered through the basal ganglia and thalami bilaterally. Advanced atrophy and diffuse white matter disease is present bilaterally. White matter changes extend into the brainstem. There are remote lacunar infarcts in the cerebellum bilaterally. The internal auditory canals are within normal limits. Vascular: Flow is present in the major intracranial arteries. Skull and upper cervical spine: The skullbase is within normal limits. Midline sagittal structures are unremarkable. Mild degenerative changes are present at C3-4. Sinuses/Orbits: Bilateral lens replacements are present. The globes and orbits are intact. The paranasal sinuses and mastoid air cells are clear. MRA HEAD FINDINGS Internal carotid arteries are within normal limits from the high cervical segments through the ICA termini bilaterally. A fetal type left posterior cerebral artery is present. There is moderate narrowing in the distal left A1 segment. The right is 1 segment is intact. The M1 segments are within normal limits bilaterally. There is marked attenuation of MCA branch vessels bilaterally. High-grade stenosis or occlusion is present in the inferior right M2 segment. The posterior inferior left M2 segment is not visualized. There is a high-grade stenosis in the residual anterior division. Signal loss in the left vertebral artery at the skullbase is likely artifactual. The right vertebral artery is not visualized. It is either hypoplastic or occluded. The basilar artery is within normal limits. The right posterior cerebral artery originates from the basilar tip. A small left P1 segment is present. The left posterior cerebral artery is of fetal type. There is marked attenuation of distal PCA branch vessels  bilaterally. IMPRESSION: 1. Punctate nonhemorrhagic infarct within the posterior limb of the left internal capsule, corresponding with the patient's right-sided facial droop and slurred speech. 2. Multiple remote lacunar infarcts throughout the basal ganglia and bilateral cerebellum. 3. Advanced atrophy and diffuse white matter disease likely reflects the sequela of chronic microvascular ischemia. 4. No other acute infarct. 5. Extensive medium and distal small vessel disease throughout the circle of Willis. 6. Moderate stenosis in the left A1 segment. 7. Marked attenuation of proximal M2 segments bilaterally. 8. Moderate stenosis of proximal posterior cerebral arteries bilaterally. 9. The left posterior cerebral artery is of fetal type. Electronically Signed   By: Marin Roberts M.D.   On: 03/15/2016 10:48   US Carotid Bilateral  Result Date: 03/15/2016 CLINICAL DATA:  Stroke.  History of hypertension and CAD. EXAM: BILATERAL CAROTID DUPLEX ULTRASOUND TECHNIQUE: Wallace Cullens  scale imaging, color Doppler and duplex ultrasound were performed of bilateral carotid and vertebral arteries in the neck. COMPARISON:  None. FINDINGS: Criteria: Quantification of carotid stenosis is based on velocity parameters that correlate the residual internal carotid diameter with NASCET-based stenosis levels, using the diameter of the distal internal carotid lumen as the denominator for stenosis measurement. The following velocity measurements were obtained: RIGHT ICA:  30/8 cm/sec CCA:  66/7 cm/sec SYSTOLIC ICA/CCA RATIO:  0.5 DIASTOLIC ICA/CCA RATIO:  1.1 ECA:  125 cm/sec LEFT ICA:  37/12 cm/sec CCA:  55/10 cm/sec SYSTOLIC ICA/CCA RATIO:  0.7 DIASTOLIC ICA/CCA RATIO:  1.2 ECA:  55 cm/sec RIGHT CAROTID ARTERY: There is a minimal amount of eccentric mixed echogenic plaque within the right carotid bulb (images 21 and 22), extending to involve the origin and proximal aspect the right internal carotid artery (image 29), not resulting in  elevated peak systolic velocities within the interrogated course of the right internal carotid artery to suggest a hemodynamically significant stenosis. RIGHT VERTEBRAL ARTERY:  Antegrade Flow LEFT CAROTID ARTERY: There is a minimal amount of eccentric mixed echogenic plaque within distal aspect the left common carotid artery (image 50), extending to involve the left carotid bulb (53). There is a minimal to moderate amount of eccentric mixed echogenic plaque involving the origin and proximal aspects of the left internal carotid artery (image 60) LEFT VERTEBRAL ARTERY:  Antegrade flow IMPRESSION: Minimal to moderate amount of bilateral atherosclerotic plaque, left subjectively greater than right, not resulting in a hemodynamically significant stenosis within either internal carotid artery. Electronically Signed   By: Simonne Come M.D.   On: 03/15/2016 09:46   Mr Maxine Glenn Head/brain [facility] Cm  Result Date: 03/15/2016 CLINICAL DATA:  The patient awoke with abnormal speech, dysphagia, and right facial droop yesterday. EXAM: MRI HEAD WITHOUT CONTRAST MRA HEAD WITHOUT CONTRAST TECHNIQUE: Multiplanar, multiecho pulse sequences of the brain and surrounding structures were obtained without intravenous contrast. Angiographic images of the head were obtained using MRA technique without contrast. COMPARISON:  None. CT head without contrast 03/14/2016 FINDINGS: MRI HEAD FINDINGS Brain: The diffusion-weighted images demonstrate an acute/ subacute infarct involving the posterior limb of the left internal capsule. Infarct measures 7 mm maximally. Subtle T2 changes are associated with the infarct, compatible with a subacute time frame. No additional infarcts are present. Multiple additional remote lacunar infarcts are scattered through the basal ganglia and thalami bilaterally. Advanced atrophy and diffuse white matter disease is present bilaterally. White matter changes extend into the brainstem. There are remote lacunar infarcts in the  cerebellum bilaterally. The internal auditory canals are within normal limits. Vascular: Flow is present in the major intracranial arteries. Skull and upper cervical spine: The skullbase is within normal limits. Midline sagittal structures are unremarkable. Mild degenerative changes are present at C3-4. Sinuses/Orbits: Bilateral lens replacements are present. The globes and orbits are intact. The paranasal sinuses and mastoid air cells are clear. MRA HEAD FINDINGS Internal carotid arteries are within normal limits from the high cervical segments through the ICA termini bilaterally. A fetal type left posterior cerebral artery is present. There is moderate narrowing in the distal left A1 segment. The right is 1 segment is intact. The M1 segments are within normal limits bilaterally. There is marked attenuation of MCA branch vessels bilaterally. High-grade stenosis or occlusion is present in the inferior right M2 segment. The posterior inferior left M2 segment is not visualized. There is a high-grade stenosis in the residual anterior division. Signal loss in the left vertebral artery  at the skullbase is likely artifactual. The right vertebral artery is not visualized. It is either hypoplastic or occluded. The basilar artery is within normal limits. The right posterior cerebral artery originates from the basilar tip. A small left P1 segment is present. The left posterior cerebral artery is of fetal type. There is marked attenuation of distal PCA branch vessels bilaterally. IMPRESSION: 1. Punctate nonhemorrhagic infarct within the posterior limb of the left internal capsule, corresponding with the patient's right-sided facial droop and slurred speech. 2. Multiple remote lacunar infarcts throughout the basal ganglia and bilateral cerebellum. 3. Advanced atrophy and diffuse white matter disease likely reflects the sequela of chronic microvascular ischemia. 4. No other acute infarct. 5. Extensive medium and distal small  vessel disease throughout the circle of Willis. 6. Moderate stenosis in the left A1 segment. 7. Marked attenuation of proximal M2 segments bilaterally. 8. Moderate stenosis of proximal posterior cerebral arteries bilaterally. 9. The left posterior cerebral artery is of fetal type. Electronically Signed   By: Marin Roberts M.D.   On: 03/15/2016 10:48    Assessment: 82 y.o. female with a history of atrial fibrillation on Eliquis and ASA who presented with facial droop and slurred speech on yesterday.  Also noted on examination to have RUE weakness as well.  MRI of the brain personally reviewed and shows a small acute left IC infarct.  Etiology likely embolic.  MRA shows extensive small vessel disease.  Carotid dopplers show no evidence of hemodynamically significant stenosis.  Echocardiogram shows no cardiac source of emboli with an EF of 20%.  A1c 6.9, LDL 101.  Stroke Risk Factors - atrial fibrillation, hyperlipidemia and hypertension  Plan: 1. PT consult, OT consult, Speech consult 2. Prophylactic therapy-Continue Eliquis and ASA.  Patient at maximal medical therapy 3. Follow up with neurology on an outpatient basis.   4. Frequent neuro checks   Thana Farr, MD Neurology [phone] 03/15/2016, 12:15 PM

## 2016-03-15 NOTE — ED Notes (Signed)
Called floor to let them know pt on the way up 

## 2016-03-15 NOTE — Evaluation (Addendum)
Clinical/Bedside Swallow Evaluation Patient Details  Name: Heidi Miller MRN: [id number] Date of Birth: 1923-05-16  Today's Date: 03/15/2016 Time: SLP Start Time (ACUTE ONLY): 1150 SLP Stop Time (ACUTE ONLY): 1250 SLP Time Calculation (min) (ACUTE ONLY): 60 min  Past Medical History:  Past Medical History:  Diagnosis Date  . Atrial fibrillation (HCC)   . Dementia   . Hypertension   . Thyroid disease    Past Surgical History:  Past Surgical History:  Procedure Laterality Date  . CARDIAC CATHETERIZATION    . CHOLECYSTECTOMY     HPI:  Pt  is a 82 y.o. female w/ h/o HTN, Afib, and Dementia who presents with mild speech change and facial droop. Patient's last normal was 24 hours ago, this morning when her caregiver came in to see her she woke up with these symptoms. She was brought to the ED for evaluation. Initial workup here is largely negative except for a very very mildly elevated troponin. She does have persistent neurological symptoms. Hospitalists were called for admission and further evaluation. Currently pt is awake/alert and verbally responsive. Pt does have baseline Dementia which can decreased verbal output but pt did follow general verbal tasks and questions. Speech was 100% intelligible w/ only slight decreased articulation of speech d/t R labial decreased tone/weakness. (During Automatic speech tasks.) Recommend f/u w/ primary MD post discharge for referral to Outpatient or HH ST services for any f/u Dysarthria assessment needs if deficits continue to persist that significantly impact speech and language abilities(recommendation was given for overarticulation during speech to increase precision of speech).   Assessment / Plan / Recommendation Clinical Impression   Pt appeared to adequately tolerate trials of thin liquids, purees and soft solids w/ no overt s/s of aspiration during/post po trials. No decline in vocal quality or respiratory status w/ po intake noted. Oral phase  grossly wfl for bolus management and clearing; slight increased oral phase time w/ increased texture but cleared appropriately given time. Pt fed self w/ setup support. Pt does wear dentures.  Pt does have baseline of Cognitive decline/Dementia and would benefit from following aspiration precautions as well as having monitoring during meals to ensure safety w/ oral intake. ST services will f/u w/ toleration of diet while admitted and make any needed adjustments to diet consistency if indicated. NSG updated. Daughter agreed.      Aspiration Risk   (reduced following general precautions)    Diet Recommendation  Dysphagia 3 diet w/ thin liquids; general aspiration precautions; tray setup at meals.  Medication Administration: Whole meds with puree (for easier swallowing at her age)    Other  Recommendations Recommended Consults:  (TBD) Oral Care Recommendations: Oral care BID;Staff/trained caregiver to provide oral care   Follow up Recommendations  None (TBD)    Frequency and Duration min 1 x/week  1 week       Prognosis Prognosis for Safe Diet Advancement: Good Barriers to Reach Goals: Cognitive deficits      Swallow Study   General Date of Onset: 03/14/16 HPI: Pt  is a 82 y.o. female w/ h/o HTN, Afib, and Dementia who presents with mild speech change and facial droop. Patient's last normal was 24 hours ago, this morning when her caregiver came in to see her she woke up with these symptoms. She was brought to the ED for evaluation. Initial workup here is largely negative except for a very very mildly elevated troponin. She does have persistent neurological symptoms. Hospitalists were called for admission and further  evaluation. Currently pt is awake/alert and verbally responsive. Pt does have baseline Dementia which can decreased verbal output but pt did follow general verbal tasks and questions. Speech was 100% intelligible w/ only slight decreased articulation of speech d/t R labial  decreased tone/weakness.  Type of Study: Bedside Swallow Evaluation Previous Swallow Assessment: none Diet Prior to this Study: Regular;Thin liquids Temperature Spikes Noted: No (wbc not elevated) Respiratory Status: Room air History of Recent Intubation: No Behavior/Cognition: Alert;Cooperative;Pleasant mood;Confused;Requires cueing (baseline Dementia) Oral Cavity Assessment: Within Functional Limits Oral Care Completed by SLP: Recent completion by staff Oral Cavity - Dentition: Dentures, top;Dentures, bottom Vision: Functional for self-feeding Self-Feeding Abilities: Able to feed self;Needs assist;Needs set up Patient Positioning: Upright in chair Baseline Vocal Quality: Normal Volitional Cough: Strong Volitional Swallow: Able to elicit    Oral/Motor/Sensory Function Overall Oral Motor/Sensory Function: Mild impairment Facial ROM: Reduced right (slight) Facial Symmetry: Within Functional Limits Facial Strength: Reduced right (slight) Facial Sensation: Within Functional Limits Lingual ROM: Within Functional Limits Lingual Symmetry: Within Functional Limits (grossly) Lingual Strength: Within Functional Limits Lingual Sensation: Within Functional Limits Velum: Within Functional Limits Mandible: Within Functional Limits   Ice Chips Ice chips: Within functional limits Presentation: Spoon (fed; 3 trials)   Thin Liquid Thin Liquid: Within functional limits Presentation: Cup;Self Fed;Straw (~4-5 ozs total) Other Comments: mild, delayed throat clear x1 - unsure if related to po trials given     Nectar Thick Nectar Thick Liquid: Not tested   Honey Thick Honey Thick Liquid: Not tested   Puree Puree: Within functional limits Presentation: Self Fed;Spoon (4 ozs) Other Comments: trace labial residue x1 which pt cleared w/ lingual sweep   Solid   GO   Solid: Within functional limits (moistened; 3 trials) Presentation: Self Fed;Spoon         Jerilynn Som, MS,  CCC-SLP  Miller,Heidi 03/15/2016,1:00 PM

## 2016-03-16 NOTE — Progress Notes (Signed)
[facility] [place] [facility]                                                                                                                                                                                            Patient Demographics   Heidi RamMavis Mccuistion, is a 82 y.o. female, DOB - 01/27/1923, ZOX:[id number]  Admit date - 03/14/2016   Admitting Physician Oralia Manisavid Willis, MD  Outpatient Primary MD for the patient is BABAOFF, MARC E, MD   LOS - 2  Subjective: Patient had a fall this morning She is complaining of generalized weakness but no other complaints  Review of Systems:   CONSTITUTIONAL: No documented fever. No fatigue, Positive weakness. No weight gain, no weight loss.  EYES: No blurry or double vision.  ENT: No tinnitus. No postnasal drip. No redness of the oropharynx.  RESPIRATORY: No cough, no wheeze, no hemoptysis. No dyspnea.  CARDIOVASCULAR: No chest pain. No orthopnea. No palpitations. No syncope.  GASTROINTESTINAL: No nausea, no vomiting or diarrhea. No abdominal pain. No melena or hematochezia.  GENITOURINARY: No dysuria or hematuria.  ENDOCRINE: No polyuria or nocturia. No heat or cold intolerance.  HEMATOLOGY: No anemia. No bruising. No bleeding.  INTEGUMENTARY: No rashes. No lesions.  MUSCULOSKELETAL: No arthritis. No swelling. No gout.  NEUROLOGICPositive for facial droop on the right PSYCHIATRIC: No anxiety. No insomnia. No ADD.    Vitals:   Vitals:   03/15/16 1217 03/15/16 2120 03/16/16 0441 03/16/16 0751  BP: 96/71 (!) 92/52 (!) 90/56 105/65  Pulse: 77 84 (!) 49 (!) 119  Resp: 16   18  Temp:  98.4 F (36.9 C) 98.4 F (36.9 C) 98.3 F (36.8 C)  TempSrc:  Oral Oral Oral  SpO2: 98% 96% 97% 95%  Weight:      Height:        Wt Readings from Last 3 Encounters:  03/15/16 146 lb 8 oz (66.5 kg)  05/26/15 150 lb (68 kg)  05/05/15 150 lb (68 kg)     Intake/Output Summary (Last 24 hours) at 03/16/16 1141 Last data filed at  03/16/16 1138  Gross per 24 hour  Intake              360 ml  Output             1200 ml  Net             -840 ml    Physical Exam:   GENERAL: Pleasant-appearing in no apparent distress.  HEAD, EYES, EARS, NOSE AND THROAT: Atraumatic, normocephalic. Extraocular muscles are intact. Pupils equal and reactive to light. Sclerae  anicteric. No conjunctival injection. No oro-pharyngeal erythema.  NECK: Supple. There is no jugular venous distention. No bruits, no lymphadenopathy, no thyromegaly.  HEART: Regular rate and rhythm,. No murmurs, no rubs, no clicks.  LUNGS: Clear to auscultation bilaterally. No rales or rhonchi. No wheezes.  ABDOMEN: Soft, flat, nontender, nondistended. Has good bowel sounds. No hepatosplenomegaly appreciated.  EXTREMITIES: No evidence of any cyanosis, clubbing, or peripheral edema.  +2 pedal and radial pulses bilaterally.  NEUROLOGIC: The patient is alert, awake, and oriented  X 2  Right sided facial droop SKIN: Moist and warm with no rashes appreciated.  Psych: Not anxious, depressed LN: No inguinal LN enlargement    Antibiotics   Anti-infectives    None      Medications   Scheduled Meds: . apixaban  2.5 mg Oral BID  . aspirin  81 mg Oral [id number]  . diltiazem  120 mg Oral Daily  . furosemide  20 mg Oral Daily  . levothyroxine  100 mcg Oral QAC breakfast  . memantine  10 mg Oral BID  . metoprolol  25 mg Oral Daily  . mirtazapine  22.5 mg Oral QHS  . pantoprazole  40 mg Oral Daily  . QUEtiapine  50 mg Oral QPM  . rivastigmine  1.5 mg Oral BID WC  . spironolactone  12.5 mg Oral Daily  . venlafaxine XR  150 mg Oral Daily   Continuous Infusions:  PRN Meds:.labetalol   Data Review:   Micro Results No results found for this or any previous visit (from the past 240 hour(s)).  Radiology Reports Dg Chest 2 View  Result Date: 03/15/2016 CLINICAL DATA:  Right facial droop, possible stroke EXAM: CHEST  2 VIEW COMPARISON:  04/29/2014 FINDINGS:  Cardiomegaly again noted. No acute infiltrate or pleural effusion. No pulmonary edema. Atherosclerotic calcifications of thoracic aorta. Osteopenia and mild degenerative changes thoracic spine. Interval mild compression deformity upper lumbar spine of indeterminate age. Clinical correlation is necessary. IMPRESSION: No active cardiopulmonary disease. Cardiomegaly again noted. Interval mild compression deformity upper lumbar spine of indeterminate age. Clinical correlation is necessary. Electronically Signed   By: Natasha Mead M.D.   On: 03/15/2016 11:02   Ct Head Wo Contrast  Result Date: 03/14/2016 CLINICAL DATA:  Initial evaluation for acute altered mental status. EXAM: CT HEAD WITHOUT CONTRAST TECHNIQUE: Contiguous axial images were obtained from the base of the skull through the vertex without intravenous contrast. COMPARISON:  Prior CT from 11/24/2012. FINDINGS: Brain: Diffuse prominence of the CSF containing spaces is compatible with generalized age-related cerebral atrophy. Patchy hypodensity within the periventricular and deep white matter both cerebral hemispheres most consistent with chronic small vessel ischemic disease. Small remote left cerebellar infarct noted, stable. Small remote right occipital infarct also noted. No acute intracranial hemorrhage. No evidence for acute large vessel territory infarct. No mass lesion, midline shift, or mass effect. No hydrocephalus. No extra-axial fluid collection. Vascular: Scattered vascular calcifications within the carotid siphons. No hyperdense vessel. Skull: Scalp soft tissues within normal limits. Patient is status post remote craniotomy on the left. No calvarial fracture. Sinuses/Orbits: The globes and orbits demonstrate no acute abnormality. Paranasal sinuses are clear. Trace opacity within the bilateral mastoid air cells. Other: No other significant finding. IMPRESSION: 1. No acute intracranial process.  The 2. Remote right occipital and left cerebellar  infarcts. 3. Generalized age-related cerebral atrophy with mild chronic microvascular ischemic disease. Electronically Signed   By: Rise Mu M.D.   On: 03/14/2016 22:00   Mr Brain Wo Contrast  Result Date: 03/15/2016 CLINICAL DATA:  The patient awoke with abnormal speech, dysphagia, and right facial droop yesterday. EXAM: MRI HEAD WITHOUT CONTRAST MRA HEAD WITHOUT CONTRAST TECHNIQUE: Multiplanar, multiecho pulse sequences of the brain and surrounding structures were obtained without intravenous contrast. Angiographic images of the head were obtained using MRA technique without contrast. COMPARISON:  None. CT head without contrast 03/14/2016 FINDINGS: MRI HEAD FINDINGS Brain: The diffusion-weighted images demonstrate an acute/ subacute infarct involving the posterior limb of the left internal capsule. Infarct measures 7 mm maximally. Subtle T2 changes are associated with the infarct, compatible with a subacute time frame. No additional infarcts are present. Multiple additional remote lacunar infarcts are scattered through the basal ganglia and thalami bilaterally. Advanced atrophy and diffuse white matter disease is present bilaterally. White matter changes extend into the brainstem. There are remote lacunar infarcts in the cerebellum bilaterally. The internal auditory canals are within normal limits. Vascular: Flow is present in the major intracranial arteries. Skull and upper cervical spine: The skullbase is within normal limits. Midline sagittal structures are unremarkable. Mild degenerative changes are present at C3-4. Sinuses/Orbits: Bilateral lens replacements are present. The globes and orbits are intact. The paranasal sinuses and mastoid air cells are clear. MRA HEAD FINDINGS Internal carotid arteries are within normal limits from the high cervical segments through the ICA termini bilaterally. A fetal type left posterior cerebral artery is present. There is moderate narrowing in the distal left  A1 segment. The right is 1 segment is intact. The M1 segments are within normal limits bilaterally. There is marked attenuation of MCA branch vessels bilaterally. High-grade stenosis or occlusion is present in the inferior right M2 segment. The posterior inferior left M2 segment is not visualized. There is a high-grade stenosis in the residual anterior division. Signal loss in the left vertebral artery at the skullbase is likely artifactual. The right vertebral artery is not visualized. It is either hypoplastic or occluded. The basilar artery is within normal limits. The right posterior cerebral artery originates from the basilar tip. A small left P1 segment is present. The left posterior cerebral artery is of fetal type. There is marked attenuation of distal PCA branch vessels bilaterally. IMPRESSION: 1. Punctate nonhemorrhagic infarct within the posterior limb of the left internal capsule, corresponding with the patient's right-sided facial droop and slurred speech. 2. Multiple remote lacunar infarcts throughout the basal ganglia and bilateral cerebellum. 3. Advanced atrophy and diffuse white matter disease likely reflects the sequela of chronic microvascular ischemia. 4. No other acute infarct. 5. Extensive medium and distal small vessel disease throughout the circle of Willis. 6. Moderate stenosis in the left A1 segment. 7. Marked attenuation of proximal M2 segments bilaterally. 8. Moderate stenosis of proximal posterior cerebral arteries bilaterally. 9. The left posterior cerebral artery is of fetal type. Electronically Signed   By: Marin Roberts M.D.   On: 03/15/2016 10:48   US Carotid Bilateral  Result Date: 03/15/2016 CLINICAL DATA:  Stroke.  History of hypertension and CAD. EXAM: BILATERAL CAROTID DUPLEX ULTRASOUND TECHNIQUE: Wallace Cullens scale imaging, color Doppler and duplex ultrasound were performed of bilateral carotid and vertebral arteries in the neck. COMPARISON:  None. FINDINGS: Criteria:  Quantification of carotid stenosis is based on velocity parameters that correlate the residual internal carotid diameter with NASCET-based stenosis levels, using the diameter of the distal internal carotid lumen as the denominator for stenosis measurement. The following velocity measurements were obtained: RIGHT ICA:  30/8 cm/sec CCA:  66/7 cm/sec SYSTOLIC ICA/CCA RATIO:  0.5 DIASTOLIC ICA/CCA  RATIO:  1.1 ECA:  125 cm/sec LEFT ICA:  37/12 cm/sec CCA:  55/10 cm/sec SYSTOLIC ICA/CCA RATIO:  0.7 DIASTOLIC ICA/CCA RATIO:  1.2 ECA:  55 cm/sec RIGHT CAROTID ARTERY: There is a minimal amount of eccentric mixed echogenic plaque within the right carotid bulb (images 21 and 22), extending to involve the origin and proximal aspect the right internal carotid artery (image 29), not resulting in elevated peak systolic velocities within the interrogated course of the right internal carotid artery to suggest a hemodynamically significant stenosis. RIGHT VERTEBRAL ARTERY:  Antegrade Flow LEFT CAROTID ARTERY: There is a minimal amount of eccentric mixed echogenic plaque within distal aspect the left common carotid artery (image 50), extending to involve the left carotid bulb (53). There is a minimal to moderate amount of eccentric mixed echogenic plaque involving the origin and proximal aspects of the left internal carotid artery (image 60) LEFT VERTEBRAL ARTERY:  Antegrade flow IMPRESSION: Minimal to moderate amount of bilateral atherosclerotic plaque, left subjectively greater than right, not resulting in a hemodynamically significant stenosis within either internal carotid artery. Electronically Signed   By: Simonne Come M.D.   On: 03/15/2016 09:46   Mr Maxine Glenn Head/brain ZO Cm  Result Date: 03/15/2016 CLINICAL DATA:  The patient awoke with abnormal speech, dysphagia, and right facial droop yesterday. EXAM: MRI HEAD WITHOUT CONTRAST MRA HEAD WITHOUT CONTRAST TECHNIQUE: Multiplanar, multiecho pulse sequences of the brain and  surrounding structures were obtained without intravenous contrast. Angiographic images of the head were obtained using MRA technique without contrast. COMPARISON:  None. CT head without contrast 03/14/2016 FINDINGS: MRI HEAD FINDINGS Brain: The diffusion-weighted images demonstrate an acute/ subacute infarct involving the posterior limb of the left internal capsule. Infarct measures 7 mm maximally. Subtle T2 changes are associated with the infarct, compatible with a subacute time frame. No additional infarcts are present. Multiple additional remote lacunar infarcts are scattered through the basal ganglia and thalami bilaterally. Advanced atrophy and diffuse white matter disease is present bilaterally. White matter changes extend into the brainstem. There are remote lacunar infarcts in the cerebellum bilaterally. The internal auditory canals are within normal limits. Vascular: Flow is present in the major intracranial arteries. Skull and upper cervical spine: The skullbase is within normal limits. Midline sagittal structures are unremarkable. Mild degenerative changes are present at C3-4. Sinuses/Orbits: Bilateral lens replacements are present. The globes and orbits are intact. The paranasal sinuses and mastoid air cells are clear. MRA HEAD FINDINGS Internal carotid arteries are within normal limits from the high cervical segments through the ICA termini bilaterally. A fetal type left posterior cerebral artery is present. There is moderate narrowing in the distal left A1 segment. The right is 1 segment is intact. The M1 segments are within normal limits bilaterally. There is marked attenuation of MCA branch vessels bilaterally. High-grade stenosis or occlusion is present in the inferior right M2 segment. The posterior inferior left M2 segment is not visualized. There is a high-grade stenosis in the residual anterior division. Signal loss in the left vertebral artery at the skullbase is likely artifactual. The right  vertebral artery is not visualized. It is either hypoplastic or occluded. The basilar artery is within normal limits. The right posterior cerebral artery originates from the basilar tip. A small left P1 segment is present. The left posterior cerebral artery is of fetal type. There is marked attenuation of distal PCA branch vessels bilaterally. IMPRESSION: 1. Punctate nonhemorrhagic infarct within the posterior limb of the left internal capsule, corresponding with the patient's  right-sided facial droop and slurred speech. 2. Multiple remote lacunar infarcts throughout the basal ganglia and bilateral cerebellum. 3. Advanced atrophy and diffuse white matter disease likely reflects the sequela of chronic microvascular ischemia. 4. No other acute infarct. 5. Extensive medium and distal small vessel disease throughout the circle of Willis. 6. Moderate stenosis in the left A1 segment. 7. Marked attenuation of proximal M2 segments bilaterally. 8. Moderate stenosis of proximal posterior cerebral arteries bilaterally. 9. The left posterior cerebral artery is of fetal type. Electronically Signed   By: Marin Roberts M.D.   On: 03/15/2016 10:48     CBC  Recent Labs Lab 03/14/16 2054  WBC 8.7  HGB 13.1  HCT 40.4  PLT 244  MCV 83.7  MCH 27.1  MCHC 32.4  RDW 17.8*  LYMPHSABS 3.1  MONOABS 0.8  EOSABS 0.1  BASOSABS 0.1    Chemistries   Recent Labs Lab 03/14/16 2054  NA 141  K 4.0  CL 105  CO2 24  GLUCOSE 209*  BUN 30*  CREATININE 1.42*  CALCIUM 9.6  AST 22  ALT 11*  ALKPHOS 85  BILITOT 0.6   ------------------------------------------------------------------------------------------------------------------ estimated creatinine clearance is 23.6 mL/min (by C-G formula based on SCr of 1.42 mg/dL). ------------------------------------------------------------------------------------------------------------------  Recent Labs  03/15/16 0131  HGBA1C 6.9*    ------------------------------------------------------------------------------------------------------------------  Recent Labs  03/15/16 0131  CHOL 192  HDL 41  LDLCALC 101*  TRIG 248*  CHOLHDL 4.7   ------------------------------------------------------------------------------------------------------------------ No results for input(s): TSH, T4TOTAL, T3FREE, THYROIDAB in the last 72 hours.  Invalid input(s): FREET3 ------------------------------------------------------------------------------------------------------------------ No results for input(s): VITAMINB12, FOLATE, FERRITIN, TIBC, IRON, RETICCTPCT in the last 72 hours.  Coagulation profile  Recent Labs Lab 03/14/16 2054  INR 1.23    No results for input(s): DDIMER in the last 72 hours.  Cardiac Enzymes  Recent Labs Lab 03/15/16 0131 03/15/16 0653 03/15/16 1310  TROPONINI <0.03 <0.03 <0.03   ------------------------------------------------------------------------------------------------------------------ Invalid input(s): POCBNP    Assessment & Plan   Patient is a 82 year old with acute CVA  1. Acute CVA  patient already  on low-dose aspirin and eliquis Patient with a fall needs reevaluation with PT will likely need rehabilitation She is noted to have arthrosclerotic disease in the brain as well as carotid artery. Medical management   2. Atrial fibrillation continue therapy with Eliquis and metoprolol Echo reviewed with severe systolic dysfunction  3 hypertension continue therapy with diltiazem and metoprolol  4. Dementia continue Effexor Resume Exelon and Seroquel  5. Miscellaneous artery on eliquist for DVT prophylaxis    Code Status Orders        Start     Ordered   03/15/16 0109  Full code  Continuous     03/15/16 0108    Code Status History    Date Active Date Inactive Code Status Order ID Comments User Context   03/15/2016  1:08 AM 03/15/2016 10:36 AM Full Code [id number]  Oralia Manis, MD Inpatient    Advance Directive Documentation   Flowsheet Row Most Recent Value  Type of Advance Directive  Healthcare Power of Attorney  Pre-existing out of facility DNR order (yellow form or pink MOST form)  No data  "MOST" Form in Place?  No data           Consults  Neurology  DVT Prophylaxis eliqis  Lab Results  Component Value Date   PLT 244 03/14/2016     Time Spent in minutes  Greater than 50% of time spent in care  coordination and counseling patient regarding the condition and plan of care.   Auburn Bilberry M.D on 03/16/2016 at 11:41 AM  Between 7am to 6pm - Pager - [phone]  After 6pm go to [email or web address] - password [email or web address] [facility]  [facility]   Office  [phone]

## 2016-03-16 NOTE — Clinical Social Work Note (Signed)
CSW received consult for SNF placement. PT/OT recommendations do not specify this course; recs for HHPT and HHOT. RNCM aware. CSW signing off.  Argentina PonderKaren Martha Kinnick Maus, MSW, LCSW-A [phone]

## 2016-03-16 NOTE — Plan of Care (Addendum)
Pt trying to get OOB unassisted several times this shift.  Bed alarm used to help prevent falls.  Pt up out of bed this am unassisted and almost fell. Staff arriving to bed alarm was able to prevent fall.  Bed alarm placed on a more sensitive level of monitoring.  Pt remains free of injury and falls this shift.  Orson Apeanielle Skyelynn Rambeau, RN

## 2016-03-16 NOTE — Progress Notes (Signed)
Physical Therapy Treatment Patient Details Name: Heidi Miller MRN: [id number] DOB: 09-04-1922 Today's Date: 03/16/2016    History of Present Illness 82 y/o female who started having facial droop and some instability. MRI found Punctate nonhemorrhagic infarct.    PT Comments    Pt continues to be eager to work with PT and was very pleasant t/o the session.  She continues to have veering/leaning/occasional staggering to the R needing light assist to maintain balance/upright.  Pt consistently staying to the R side of the walker and not able to straighten up despite heavy cuing.  Pt wanting to go home but is not safe at this time secondary to inability to right herself in standing and inconsistent ability to maintain sitting/standing neutral posture/balance.   Follow Up Recommendations  SNF (pt is unsafe to be home alone)     Equipment Recommendations       Recommendations for Other Services       Precautions / Restrictions Precautions Precautions: Fall Restrictions Weight Bearing Restrictions: No    Mobility  Bed Mobility Overal bed mobility: Needs Assistance Bed Mobility: Supine to Sit     Supine to sit: Min guard     General bed mobility comments: Pt again very slow and labored getting to EOB, needing cues and hand rails.  Pt better able to maitain upright posture with sitting balance.   Transfers Overall transfer level: Needs assistance Equipment used: Rolling walker (2 wheeled) Transfers: Sit to/from Stand Sit to Stand: Min guard         General transfer comment: Pt with less lean to the R during transition, but still needing extra time, cuing and close guarding for safety.  Ambulation/Gait Ambulation/Gait assistance: Min assist Ambulation Distance (Feet): 150 Feet Assistive device: Rolling walker (2 wheeled)       General Gait Details: Pt continues with consistent lean to the L and staying in L side of walker despite heavy suing (verbal and tactile) hitter  her feet on the R leg of the walker and having 2 stagger steps to the R needing light assist to maintain upright.  Pt shows great effort, is not overly fatigued.  Pt able to walk ~20 ft with R rail and then ~20 ft with L rail w/o LOBs but like walker ambulation was slow, guarded and showed some unsteadiness.    Stairs            Wheelchair Mobility    Modified Rankin (Stroke Patients Only)       Balance Overall balance assessment: Needs assistance   Sitting balance-Leahy Scale: Good Sitting balance - Comments: less R lean today, still symmetrical     Standing balance-Leahy Scale: Fair Standing balance comment: Pt leaning to the R, does relatively well with b/l UEs on something solid, walker at times not enough to maintain upright/balance w/o assist                    Cognition Arousal/Alertness: Awake/alert (mild confusion) Behavior During Therapy: WFL for tasks assessed/performed Overall Cognitive Status: History of cognitive impairments - at baseline                      Exercises Other Exercises Other Exercises: Standing balance activites at EOB with single and b/l UE assist (wide and narrow BOS, faded UE assist).  Pt struggles with decreased UE assist with consistent posture/lean to the R that is not her baseline.      General Comments  Pertinent Vitals/Pain Pain Assessment: No/denies pain    Home Living                      Prior Function            PT Goals (current goals can now be found in the care plan section) Progress towards PT goals: Progressing toward goals    Frequency  7X/week    PT Plan Current plan remains appropriate    Co-evaluation             End of Session Equipment Utilized During Treatment: Gait belt Activity Tolerance: Patient tolerated treatment well Patient left: with chair alarm set;with call bell/phone within reach     Time: 1610-9604 PT Time Calculation (min) (ACUTE ONLY): 27  min  Charges:  $Gait Training: 8-22 mins $Therapeutic Activity: 8-22 mins                    G Codes:      Malachi Pro, DPT 03/16/2016, 5:25 PM

## 2016-03-16 NOTE — Clinical Social Work Note (Addendum)
CSW contacted by Dr. Allena KatzPatel. Patient had almost fallen this morning, and PT will be re-ordered. CSW advised Dr. Allena KatzPatel that because PT recommended HHPT and SNF, insurance would not cover SNF -- one or the other would have to be specified. CSW will contact family and begin SNF placement referrals pending PT reassessment.   Heidi Miller, MSW, LCSW-A [phone]

## 2016-03-17 LAB — CBC
HEMATOCRIT: 37.1 % (ref 35.0–47.0)
Hemoglobin: 12.1 g/dL (ref 12.0–16.0)
MCH: 27 pg (ref 26.0–34.0)
MCHC: 32.7 g/dL (ref 32.0–36.0)
MCV: 82.6 fL (ref 80.0–100.0)
PLATELETS: 221 10*3/uL (ref 150–440)
RBC: 4.48 MIL/uL (ref 3.80–5.20)
RDW: 17.8 % — AB (ref 11.5–14.5)
WBC: 8.1 10*3/uL (ref 3.6–11.0)

## 2016-03-17 NOTE — Progress Notes (Signed)
Received MD order to discharge patient to SNF.  Called [facility] and gave report to Joni ReiningNicole Minor RN , patient discharged in wheelchair with Nursing and daughter daughter driving patient to [facility].

## 2016-03-17 NOTE — Discharge Summary (Signed)
Heidi Miller, 82 y.o., DOB 27-Jan-1923, MRN [id number]. Admission date: 03/14/2016 Discharge Date 03/17/2016 Primary MD BABAOFF, Lavada Mesi, MD Admitting Physician Oralia Manis, MD  Admission Diagnosis  Facial droop [R29.810]  Discharge Diagnosis   Principal Problem:    Acute Stroke (HCC)   A-fib (HCC)   Dementia   HTN (hypertension)   Hypothyroidism            Hospital Course  Patient is an 82 year old white female who presented with changes in her speech dysphasia and a facial droop. Initial workup was negative. She was admitted for stroke. Patient did have a acute CVA in the right internal capsule. We'll workup including MRI of the brain showed arthrosclerotic disease. Her echocardiogram showed no evidence of thrombus. She is chronically on Eliquis. Aspirin is also continued. She was seen by physical therapy and recommended to be sent to rehabilitation. She is doing much better. She has generalized weakness.           Consults  None  Significant Tests:  See full reports for all details     Dg Chest 2 View  Result Date: 03/15/2016 CLINICAL DATA:  Right facial droop, possible stroke EXAM: CHEST  2 VIEW COMPARISON:  04/29/2014 FINDINGS: Cardiomegaly again noted. No acute infiltrate or pleural effusion. No pulmonary edema. Atherosclerotic calcifications of thoracic aorta. Osteopenia and mild degenerative changes thoracic spine. Interval mild compression deformity upper lumbar spine of indeterminate age. Clinical correlation is necessary. IMPRESSION: No active cardiopulmonary disease. Cardiomegaly again noted. Interval mild compression deformity upper lumbar spine of indeterminate age. Clinical correlation is necessary. Electronically Signed   By: Natasha Mead M.D.   On: 03/15/2016 11:02   Ct Head Wo Contrast  Result Date: 03/14/2016 CLINICAL DATA:  Initial evaluation for acute altered mental status. EXAM: CT HEAD WITHOUT CONTRAST TECHNIQUE: Contiguous axial images were obtained from the  base of the skull through the vertex without intravenous contrast. COMPARISON:  Prior CT from 11/24/2012. FINDINGS: Brain: Diffuse prominence of the CSF containing spaces is compatible with generalized age-related cerebral atrophy. Patchy hypodensity within the periventricular and deep white matter both cerebral hemispheres most consistent with chronic small vessel ischemic disease. Small remote left cerebellar infarct noted, stable. Small remote right occipital infarct also noted. No acute intracranial hemorrhage. No evidence for acute large vessel territory infarct. No mass lesion, midline shift, or mass effect. No hydrocephalus. No extra-axial fluid collection. Vascular: Scattered vascular calcifications within the carotid siphons. No hyperdense vessel. Skull: Scalp soft tissues within normal limits. Patient is status post remote craniotomy on the left. No calvarial fracture. Sinuses/Orbits: The globes and orbits demonstrate no acute abnormality. Paranasal sinuses are clear. Trace opacity within the bilateral mastoid air cells. Other: No other significant finding. IMPRESSION: 1. No acute intracranial process.  The 2. Remote right occipital and left cerebellar infarcts. 3. Generalized age-related cerebral atrophy with mild chronic microvascular ischemic disease. Electronically Signed   By: Rise Mu M.D.   On: 03/14/2016 22:00   Mr Brain Wo Contrast  Result Date: 03/15/2016 CLINICAL DATA:  The patient awoke with abnormal speech, dysphagia, and right facial droop yesterday. EXAM: MRI HEAD WITHOUT CONTRAST MRA HEAD WITHOUT CONTRAST TECHNIQUE: Multiplanar, multiecho pulse sequences of the brain and surrounding structures were obtained without intravenous contrast. Angiographic images of the head were obtained using MRA technique without contrast. COMPARISON:  None. CT head without contrast 03/14/2016 FINDINGS: MRI HEAD FINDINGS Brain: The diffusion-weighted images demonstrate an acute/ subacute infarct  involving the posterior limb of the left  internal capsule. Infarct measures 7 mm maximally. Subtle T2 changes are associated with the infarct, compatible with a subacute time frame. No additional infarcts are present. Multiple additional remote lacunar infarcts are scattered through the basal ganglia and thalami bilaterally. Advanced atrophy and diffuse white matter disease is present bilaterally. White matter changes extend into the brainstem. There are remote lacunar infarcts in the cerebellum bilaterally. The internal auditory canals are within normal limits. Vascular: Flow is present in the major intracranial arteries. Skull and upper cervical spine: The skullbase is within normal limits. Midline sagittal structures are unremarkable. Mild degenerative changes are present at C3-4. Sinuses/Orbits: Bilateral lens replacements are present. The globes and orbits are intact. The paranasal sinuses and mastoid air cells are clear. MRA HEAD FINDINGS Internal carotid arteries are within normal limits from the high cervical segments through the ICA termini bilaterally. A fetal type left posterior cerebral artery is present. There is moderate narrowing in the distal left A1 segment. The right is 1 segment is intact. The M1 segments are within normal limits bilaterally. There is marked attenuation of MCA branch vessels bilaterally. High-grade stenosis or occlusion is present in the inferior right M2 segment. The posterior inferior left M2 segment is not visualized. There is a high-grade stenosis in the residual anterior division. Signal loss in the left vertebral artery at the skullbase is likely artifactual. The right vertebral artery is not visualized. It is either hypoplastic or occluded. The basilar artery is within normal limits. The right posterior cerebral artery originates from the basilar tip. A small left P1 segment is present. The left posterior cerebral artery is of fetal type. There is marked attenuation of  distal PCA branch vessels bilaterally. IMPRESSION: 1. Punctate nonhemorrhagic infarct within the posterior limb of the left internal capsule, corresponding with the patient's right-sided facial droop and slurred speech. 2. Multiple remote lacunar infarcts throughout the basal ganglia and bilateral cerebellum. 3. Advanced atrophy and diffuse white matter disease likely reflects the sequela of chronic microvascular ischemia. 4. No other acute infarct. 5. Extensive medium and distal small vessel disease throughout the circle of Willis. 6. Moderate stenosis in the left A1 segment. 7. Marked attenuation of proximal M2 segments bilaterally. 8. Moderate stenosis of proximal posterior cerebral arteries bilaterally. 9. The left posterior cerebral artery is of fetal type. Electronically Signed   By: Marin Roberts M.D.   On: 03/15/2016 10:48   US Carotid Bilateral  Result Date: 03/15/2016 CLINICAL DATA:  Stroke.  History of hypertension and CAD. EXAM: BILATERAL CAROTID DUPLEX ULTRASOUND TECHNIQUE: Wallace Cullens scale imaging, color Doppler and duplex ultrasound were performed of bilateral carotid and vertebral arteries in the neck. COMPARISON:  None. FINDINGS: Criteria: Quantification of carotid stenosis is based on velocity parameters that correlate the residual internal carotid diameter with NASCET-based stenosis levels, using the diameter of the distal internal carotid lumen as the denominator for stenosis measurement. The following velocity measurements were obtained: RIGHT ICA:  30/8 cm/sec CCA:  66/7 cm/sec SYSTOLIC ICA/CCA RATIO:  0.5 DIASTOLIC ICA/CCA RATIO:  1.1 ECA:  125 cm/sec LEFT ICA:  37/12 cm/sec CCA:  55/10 cm/sec SYSTOLIC ICA/CCA RATIO:  0.7 DIASTOLIC ICA/CCA RATIO:  1.2 ECA:  55 cm/sec RIGHT CAROTID ARTERY: There is a minimal amount of eccentric mixed echogenic plaque within the right carotid bulb (images 21 and 22), extending to involve the origin and proximal aspect the right internal carotid artery (image  29), not resulting in elevated peak systolic velocities within the interrogated course of the right internal carotid  artery to suggest a hemodynamically significant stenosis. RIGHT VERTEBRAL ARTERY:  Antegrade Flow LEFT CAROTID ARTERY: There is a minimal amount of eccentric mixed echogenic plaque within distal aspect the left common carotid artery (image 50), extending to involve the left carotid bulb (53). There is a minimal to moderate amount of eccentric mixed echogenic plaque involving the origin and proximal aspects of the left internal carotid artery (image 60) LEFT VERTEBRAL ARTERY:  Antegrade flow IMPRESSION: Minimal to moderate amount of bilateral atherosclerotic plaque, left subjectively greater than right, not resulting in a hemodynamically significant stenosis within either internal carotid artery. Electronically Signed   By: Simonne Come M.D.   On: 03/15/2016 09:46   Mr Maxine Glenn Head/brain ZO Cm  Result Date: 03/15/2016 CLINICAL DATA:  The patient awoke with abnormal speech, dysphagia, and right facial droop yesterday. EXAM: MRI HEAD WITHOUT CONTRAST MRA HEAD WITHOUT CONTRAST TECHNIQUE: Multiplanar, multiecho pulse sequences of the brain and surrounding structures were obtained without intravenous contrast. Angiographic images of the head were obtained using MRA technique without contrast. COMPARISON:  None. CT head without contrast 03/14/2016 FINDINGS: MRI HEAD FINDINGS Brain: The diffusion-weighted images demonstrate an acute/ subacute infarct involving the posterior limb of the left internal capsule. Infarct measures 7 mm maximally. Subtle T2 changes are associated with the infarct, compatible with a subacute time frame. No additional infarcts are present. Multiple additional remote lacunar infarcts are scattered through the basal ganglia and thalami bilaterally. Advanced atrophy and diffuse white matter disease is present bilaterally. White matter changes extend into the brainstem. There are remote  lacunar infarcts in the cerebellum bilaterally. The internal auditory canals are within normal limits. Vascular: Flow is present in the major intracranial arteries. Skull and upper cervical spine: The skullbase is within normal limits. Midline sagittal structures are unremarkable. Mild degenerative changes are present at C3-4. Sinuses/Orbits: Bilateral lens replacements are present. The globes and orbits are intact. The paranasal sinuses and mastoid air cells are clear. MRA HEAD FINDINGS Internal carotid arteries are within normal limits from the high cervical segments through the ICA termini bilaterally. A fetal type left posterior cerebral artery is present. There is moderate narrowing in the distal left A1 segment. The right is 1 segment is intact. The M1 segments are within normal limits bilaterally. There is marked attenuation of MCA branch vessels bilaterally. High-grade stenosis or occlusion is present in the inferior right M2 segment. The posterior inferior left M2 segment is not visualized. There is a high-grade stenosis in the residual anterior division. Signal loss in the left vertebral artery at the skullbase is likely artifactual. The right vertebral artery is not visualized. It is either hypoplastic or occluded. The basilar artery is within normal limits. The right posterior cerebral artery originates from the basilar tip. A small left P1 segment is present. The left posterior cerebral artery is of fetal type. There is marked attenuation of distal PCA branch vessels bilaterally. IMPRESSION: 1. Punctate nonhemorrhagic infarct within the posterior limb of the left internal capsule, corresponding with the patient's right-sided facial droop and slurred speech. 2. Multiple remote lacunar infarcts throughout the basal ganglia and bilateral cerebellum. 3. Advanced atrophy and diffuse white matter disease likely reflects the sequela of chronic microvascular ischemia. 4. No other acute infarct. 5. Extensive  medium and distal small vessel disease throughout the circle of Willis. 6. Moderate stenosis in the left A1 segment. 7. Marked attenuation of proximal M2 segments bilaterally. 8. Moderate stenosis of proximal posterior cerebral arteries bilaterally. 9. The left posterior cerebral artery  is of fetal type. Electronically Signed   By: Marin Roberts M.D.   On: 03/15/2016 10:48       Today   Subjective:   Heidi Miller  feeling better denies any complaints.  Objective:   Blood pressure (!) 95/55, pulse 72, temperature 98.8 F (37.1 C), resp. rate 18, height 5\' 3"  (1.6 m), weight 146 lb 8 oz (66.5 kg), SpO2 97 %.  .  Intake/Output Summary (Last 24 hours) at 03/17/16 1308 Last data filed at 03/17/16 1008  Gross per 24 hour  Intake              460 ml  Output              200 ml  Net              260 ml    Exam VITAL SIGNS: Blood pressure (!) 95/55, pulse 72, temperature 98.8 F (37.1 C), resp. rate 18, height 5\' 3"  (1.6 m), weight 146 lb 8 oz (66.5 kg), SpO2 97 %.  GENERAL:  82 y.o.-year-old patient lying in the bed with no acute distress.  EYES: Pupils equal, round, reactive to light and accommodation. No scleral icterus. Extraocular muscles intact.  HEENT: Head atraumatic, normocephalic. Oropharynx and nasopharynx clear.  NECK:  Supple, no jugular venous distention. No thyroid enlargement, no tenderness.  LUNGS: Normal breath sounds bilaterally, no wheezing, rales,rhonchi or crepitation. No use of accessory muscles of respiration.  CARDIOVASCULAR: S1, S2 normal. No murmurs, rubs, or gallops.  ABDOMEN: Soft, nontender, nondistended. Bowel sounds present. No organomegaly or mass.  EXTREMITIES: No pedal edema, cyanosis, or clubbing.  NEUROLOGIC: Cranial nerves II through XII are intact. Muscle strength 5/5 in all extremities. Sensation intact. Gait not checked.  PSYCHIATRIC: The patient is alert and oriented x 3.  SKIN: No obvious rash, lesion, or ulcer.   Data Review      CBC w Diff: Lab Results  Component Value Date   WBC 8.1 03/17/2016   HGB 12.1 03/17/2016   HGB 12.5 04/29/2014   HCT 37.1 03/17/2016   HCT 40.8 04/29/2014   PLT 221 03/17/2016   PLT 241 04/29/2014   LYMPHOPCT 35 03/14/2016   LYMPHOPCT 27.0 03/11/2013   MONOPCT 9 03/14/2016   MONOPCT 7.9 03/11/2013   EOSPCT 1 03/14/2016   EOSPCT 1.5 03/11/2013   BASOPCT 1 03/14/2016   BASOPCT 0.7 03/11/2013   CMP: Lab Results  Component Value Date   NA 141 03/14/2016   NA 145 04/30/2014   K 4.0 03/14/2016   K 3.8 04/30/2014   CL 105 03/14/2016   CL 108 (H) 04/30/2014   CO2 24 03/14/2016   CO2 28 04/30/2014   BUN 30 (H) 03/14/2016   BUN 20 (H) 04/30/2014   CREATININE 1.42 (H) 03/14/2016   CREATININE 1.19 04/30/2014   PROT 7.3 03/14/2016   PROT 6.2 (L) 03/11/2013   ALBUMIN 4.1 03/14/2016   ALBUMIN 3.3 (L) 03/11/2013   BILITOT 0.6 03/14/2016   BILITOT 0.5 03/11/2013   ALKPHOS 85 03/14/2016   ALKPHOS 73 03/11/2013   AST 22 03/14/2016   AST 32 03/11/2013   ALT 11 (L) 03/14/2016   ALT 15 03/11/2013  .  Micro Results No results found for this or any previous visit (from the past 240 hour(s)).      Code Status Orders        Start     Ordered   03/16/16 1037  Do not attempt resuscitation (DNR)  Continuous  Question Answer Comment  In the event of cardiac or respiratory ARREST Do not call a "code blue"   In the event of cardiac or respiratory ARREST Do not perform Intubation, CPR, defibrillation or ACLS   In the event of cardiac or respiratory ARREST Use medication by any route, position, wound care, and other measures to relive pain and suffering. May use oxygen, suction and manual treatment of airway obstruction as needed for comfort.      03/16/16 1036    Code Status History    Date Active Date Inactive Code Status Order ID Comments User Context   03/15/2016  1:08 AM 03/15/2016 10:36 AM Full Code [id number]  Oralia Manisavid Willis, MD Inpatient    Advance Directive  Documentation   Flowsheet Row Most Recent Value  Type of Advance Directive  Healthcare Power of Attorney  Pre-existing out of facility DNR order (yellow form or pink MOST form)  No data  "MOST" Form in Place?  No data           Contact information for follow-up providers    md at snf Follow up in 4 day(s).            Contact information for after-discharge care    Destination    [facility] .   Specialty:  Skilled Nursing Facility Contact information: [address] [place] [phone]                  Discharge Medications     Medication List    TAKE these medications   apixaban 2.5 MG Tabs tablet Commonly known as:  ELIQUIS Take 2.5 mg by mouth 2 (two) times daily.   aspirin 81 MG chewable tablet Chew 81 mg by mouth every morning.   cephALEXin 500 MG capsule Commonly known as:  KEFLEX Take 1 capsule (500 mg total) by mouth 4 (four) times daily.   cyanocobalamin 1000 MCG/ML injection Commonly known as:  (VITAMIN B-12) Inject 1,000 mcg into the muscle every 30 (thirty) days.   diltiazem 120 MG 24 hr capsule Commonly known as:  CARDIZEM CD Take 120 mg by mouth daily.   furosemide 20 MG tablet Commonly known as:  LASIX Take 20 mg by mouth daily.   levothyroxine 100 MCG tablet Commonly known as:  SYNTHROID, LEVOTHROID Take 100 mcg by mouth daily.   memantine 10 MG tablet Commonly known as:  NAMENDA Take 10 mg by mouth 2 (two) times daily.   metoprolol 50 MG tablet Commonly known as:  LOPRESSOR Take 25 mg by mouth daily.   mirtazapine 45 MG tablet Commonly known as:  REMERON Take 22.5 mg by mouth at bedtime.   omeprazole 20 MG capsule Commonly known as:  PRILOSEC Take 20 mg by mouth daily.   QUEtiapine 50 MG Tb24 24 hr tablet Commonly known as:  SEROQUEL XR Take 50 mg by mouth every evening.   rivastigmine 1.5 MG capsule Commonly known as:  EXELON Take 1.5 mg by mouth 2 (two) times daily  with a meal.   spironolactone 25 MG tablet Commonly known as:  ALDACTONE Take 12.5 mg by mouth daily.   venlafaxine XR 150 MG 24 hr capsule Commonly known as:  EFFEXOR-XR Take 150 mg by mouth daily.          Total Time in preparing paper work, data evaluation and todays exam - 35 minutes  Auburn BilberryPATEL, Radiance Deady M.D on 03/17/2016 at 1:08 PM  [facility]   Office  [phone] 

## 2016-03-17 NOTE — Care Management Important Message (Signed)
Important Message  Patient Details  Name: Heidi Miller MRN: [id number] Date of Birth: [birth year]   Medicare Important Message Given:  Yes    Gwenette GreetBrenda S Ngoc Daughtridge, RN 03/17/2016, 9:41 AM

## 2016-03-17 NOTE — Progress Notes (Signed)
[employer] Auth received [id number]. Provided to [facility] Ad[facility] Coordinator at [facility].  Woodroe Modehristina Athalia Setterlund, MSW, LCSW, LCAS-A Clinical Social Worker [phone]

## 2016-03-17 NOTE — Clinical Social Work Placement (Addendum)
   CLINICAL SOCIAL WORK PLACEMENT  NOTE  Date:  03/17/2016  Patient Details  Name: Heidi Miller MRN: [id number] Date of Birth: 1922/11/17  Clinical Social Work is seeking post-discharge placement for this patient at the Skilled  Nursing Facility level of care (*CSW will initial, date and re-position this form in  chart as items are completed):  Yes   Patient/family provided with [place] Clinical Social Work Department's list of facilities offering this level of care within the geographic area requested by the patient (or if unable, by the patient's family).  Yes   Patient/family informed of their freedom to choose among providers that offer the needed level of care, that participate in Medicare, Medicaid or managed care program needed by the patient, have an available bed and are willing to accept the patient.      Patient/family informed of 's ownership interest in [facility] and [facility], as well as of the fact that they are under no obligation to receive care at these facilities.  PASRR submitted to EDS on       PASRR number received on       Existing PASRR number confirmed on 03/17/16     FL2 transmitted to all facilities in geographic area requested by pt/family on 03/17/16     FL2 transmitted to all facilities within larger geographic area on       Patient informed that his/her managed care company has contracts with or will negotiate with certain facilities, including the following:        Yes   Patient/family informed of bed offers received.  Patient chooses bed at  ([facility])     Physician recommends and patient chooses bed at      Patient to be transferred to  ([facility]) on 03/17/16.  Patient to be transferred to facility by daughter     Patient family notified on 03/17/16 of transfer.  Name of family member notified:   (Daughter)     PHYSICIAN       Additional Comment:    _______________________________________________ Idamae Lusherhristina E  Addis Bennie, LCSW 03/17/2016, 12:08 PM

## 2016-03-17 NOTE — Discharge Instructions (Signed)
°  DIET:  Dysphagia 3 diet with think liquids  DISCHARGE CONDITION:  Stable  ACTIVITY:  Activity as tolerated  OXYGEN:  Home Oxygen: No.   Oxygen Delivery: room air  DISCHARGE LOCATION:  nursing home    ADDITIONAL DISCHARGE INSTRUCTION:   If you experience worsening of your admission symptoms, develop shortness of breath, life threatening emergency, suicidal or homicidal thoughts you must seek medical attention immediately by calling 911 or calling your MD immediately  if symptoms less severe.  You Must read complete instructions/literature along with all the possible adverse reactions/side effects for all the Medicines you take and that have been prescribed to you. Take any new Medicines after you have completely understood and accpet all the possible adverse reactions/side effects.   Please note  You were cared for by a hospitalist during your hospital stay. If you have any questions about your discharge medications or the care you received while you were in the hospital after you are discharged, you can call the unit and asked to speak with the hospitalist on call if the hospitalist that took care of you is not available. Once you are discharged, your primary care physician will handle any further medical issues. Please note that NO REFILLS for any discharge medications will be authorized once you are discharged, as it is imperative that you return to your primary care physician (or establish a relationship with a primary care physician if you do not have one) for your aftercare needs so that they can reassess your need for medications and monitor your lab values.

## 2016-03-17 NOTE — Progress Notes (Signed)
Physical Therapy Treatment Patient Details Name: Heidi Miller MRN: [id number] DOB: 08/30/1922 Today's Date: 03/17/2016    History of Present Illness 82 y/o female who started having facial droop and some instability. MRI found Punctate nonhemorrhagic infarct.    PT Comments    Pt agreeable to PT. No voiced complaints. Pt demonstrating continued heavy lean to the right in stand and with ambulation with little attempt to correct without cueing, and even with cueing requires assist to correct. Several episodes of right staggering and loss of balance with ambulation requiring assist to correct. Pt educated in seated and long sit exercises and performs well with verbal and occasional tactile cueing. Pt currently with discharge order to skilled nursing facility to continue progression of strength, endurance, safety, balance and ambulation.   Follow Up Recommendations  SNF     Equipment Recommendations       Recommendations for Other Services       Precautions / Restrictions Precautions Precautions: Fall Restrictions Weight Bearing Restrictions: No    Mobility  Bed Mobility Overal bed mobility: Modified Independent Bed Mobility: Supine to Sit     Supine to sit: Modified independent (Device/Increase time)     General bed mobility comments: Supine to sit with increased time and use of rails  Transfers Overall transfer level: Needs assistance Equipment used: Rolling walker (2 wheeled) Transfers: Sit to/from Stand Sit to Stand: Min guard         General transfer comment: Cues for safe hand placeent and to correct heavy lean to the R  Ambulation/Gait Ambulation/Gait assistance: Min assist Ambulation Distance (Feet): 105 Feet Assistive device: Rolling walker (2 wheeled) Gait Pattern/deviations: Step-through pattern;Decreased weight shift to left;Staggering right (Heavy lean to the R)   Gait velocity interpretation: at or above normal speed for age/gender General Gait  Details: 3 episodes of significant staggering to the right without apparent attempt to correct requiring Min A to correct. Fatigues   Stairs            Wheelchair Mobility    Modified Rankin (Stroke Patients Only)       Balance Overall balance assessment: Needs assistance Sitting-balance support: Feet supported;Bilateral upper extremity supported Sitting balance-Leahy Scale: Fair     Standing balance support: Bilateral upper extremity supported Standing balance-Leahy Scale: Poor Standing balance comment: Pt unable to maintain upright balance without use of UE support                    Cognition Arousal/Alertness: Awake/alert (initially sleeping; easily awoken) Behavior During Therapy: WFL for tasks assessed/performed Overall Cognitive Status: History of cognitive impairments - at baseline                      Exercises Other Exercises Other Exercises: Seated heel/toe raise, march, FAQ, hip abd/add 2 sets of 10 each. In chair QS, GS x 10 each    General Comments        Pertinent Vitals/Pain Pain Assessment: No/denies pain    Home Living                      Prior Function            PT Goals (current goals can now be found in the care plan section) Progress towards PT goals: Progressing toward goals    Frequency  7X/week    PT Plan Current plan remains appropriate    Co-evaluation  End of Session Equipment Utilized During Treatment: Gait belt Activity Tolerance: Patient limited by fatigue;Patient tolerated treatment well Patient left: in chair;with call bell/phone within reach;with chair alarm set;Other (comment) (Pillow supporting R side)     Time: 1610-9604 PT Time Calculation (min) (ACUTE ONLY): 25 min  Charges:  $Gait Training: 8-22 mins $Therapeutic Exercise: 8-22 mins                    G Codes:      Kristeen Miss, PTA 03/17/2016, 1:11 PM

## 2016-03-17 NOTE — Clinical Social Work Note (Signed)
Clinical Social Work Assessment  Patient Details  Name: Heidi Miller MRN: [id number] Date of Birth: [birth year]  Date of referral:  03/17/16               Reason for consult:  Discharge Planning                Permission sought to share information with:  Family Supports Permission granted to share information::  Yes, Verbal Permission Granted  Name::        Agency::     Relationship::   Heidi Miller- Daughter)  Contact Information:     Housing/Transportation Living arrangements for the past 2 months:  [place] of Information:  Patient, Adult Children Heidi Miller- Daughter) Patient Interpreter Needed:  None Criminal Activity/Legal Involvement Pertinent to Current Situation/Hospitalization:  No - Comment as needed Significant Relationships:  Adult Children Heidi Miller- Passenger transport manager) Lives with:  Self Do you feel safe going back to the place where you live?  Yes Need for family participation in patient care:  Yes (Comment) Heidi Miller- Daughter)  Care giving concerns:  PT recommended SNF placement for patient.    Social Worker assessment / plan:  CSW met with patient. She is oriented X1. Patient stated she did not want to go to SNF at discharge but she would go if it's suggested. Granted CSW verbal permission to speak to her daughter about SNF placement. CSW called patient's daughter. Introduced herself and her role. Informed patient' daughter that PT recommended SNF placement. Patient's daughter is agreeable. Preference [facility]. Granted CSW verbal permission to send SNF referral to SNFs in [place]. FL2/ PASRR completed. Awaiting bed offers.   CSW presented bed offers. Accepted bed at [facility]. CSW began [employer]. Awaiting [employer] fast streamer.  Clinical Social Worker was informed that patient will be medically ready to discharge to [facility]. Patient and her daughter are in a agreement with plan. CSW called Broadus John- Admissions Coordinator at [facility] to confirm that patient's  bed is ready. Provided patient's room number 614 and number to call for report Yaakov Guthrie [phone] . All discharge information faxed to [facility] via [facility]. DNR added to discharge packet.    RN will call report and patient will discharge to [facility] via Daughter  Employment status:  Retired Nurse, adult PT Recommendations:  [place] / Referral to community resources:  [place]  Patient/Family's Response to care:  Patient and her daughter are in agreement to discharge to [facility] today  Patient/Family's Understanding of and Emotional Response to Diagnosis, Current Treatment, and Prognosis:  Patient's daughter reports that she understands patient's Diagnosis, Current Treatment, and Prognosis. Thanked CSW for assistance.   Emotional Assessment Appearance:  Appears stated age Attitude/Demeanor/Rapport:   (None) Affect (typically observed):  Accepting, Calm, Pleasant Orientation:  Oriented to Self Alcohol / Substance use:  Not Applicable Psych involvement (Current and /or in the community):  No (Comment)  Discharge Needs  Concerns to be addressed:  Discharge Planning Concerns Readmission within the last 30 days:  No Current discharge risk:  Chronically ill Barriers to Discharge:  Continued Medical Work up   [employer], LCSW 03/17/2016, 11:53 AM

## 2016-03-17 NOTE — Progress Notes (Signed)
Speech Language Pathology Treatment: Dysphagia  Patient Details Name: Heidi Miller MRN: [id number] DOB: 05/27/1923 Today's Date: 03/17/2016 Time: 1200-1230 SLP Time Calculation (min) (ACUTE ONLY): 30 min  Assessment / Plan / Recommendation Clinical Impression  Pt appears at a reduced risk for aspiration, showing no overt s/s of aspiration. Pt appeared to adequately tolerate a Dysphagia 3 diet with thin liquids. Pt was re-educated on aspiration precautions and strategies. Pt fed self with set up support. Pt does have a baseline of Cognitive decline/Dementia and would benefit from following aspiration precautions and strategies and intermittent monitoring during meals to ensure safety of oral intake.Reccomend continuation of Dysphagia 3 diet with thin liquids.       HPI HPI: Pt  is a 82 y.o. female w/ h/o HTN, Afib, and Dementia who presents with mild speech change and facial droop. She was brought to the ED for evaluation. Initial workup here is largely negative except for a very very mildly elevated troponin. She does have persistent neurological symptoms. Hospitalists were called for admission and further evaluation. Currently pt is awake/alert and verbally responsive. Pt does have baseline Dementia which can decreased verbal output but pt did follow general verbal tasks and questions. Speech was 100% intelligible w/ only slight decreased articulation of speech d/t R labial decreased tone/weakness.       SLP Plan  All goals met     Recommendations  Diet recommendations: Dysphagia 3 (mechanical soft);Thin liquid Liquids provided via: Cup;Straw Medication Administration: Whole meds with puree Supervision: Patient able to self feed;Intermittent supervision to cue for compensatory strategies Compensations: Minimize environmental distractions;Small sips/bites;Slow rate;Lingual sweep for clearance of pocketing;Follow solids with liquid Postural Changes and/or Swallow Maneuvers: Out of bed for  meals;Seated upright 90 degrees;Upright 30-60 min after meal             Oral Care Recommendations: Oral care BID;Staff/trained caregiver to provide oral care Follow up Recommendations: None Plan: All goals met     GO                Heidi Miller Clinical Graduate Student 03/17/2016, 12:36 PM

## 2016-03-17 NOTE — Progress Notes (Signed)
Inpatient Diabetes Program Recommendations  AACE/ADA: New Consensus Statement on Inpatient Glycemic Control (2015)  Target Ranges:  Prepandial:   less than 140 mg/dL      [facility] postprandial:   less than 180 mg/dL (1-2 hours)      Critically ill patients:  140 - 180 mg/dL   Results for Heidi Miller, [name] (MRN [id number]) as of 03/17/2016 10:35  Ref. Range 03/14/2016 20:54 03/15/2016 01:31  Hemoglobin A1C Latest Ref Range: 4.0 - 6.0 %  6.9 (H)  Glucose Latest Ref Range: 65 - 99 mg/dL 409209 (H)     Review of Glycemic Control  Diabetes history: No Outpatient Diabetes medications: NA Current orders for Inpatient glycemic control: None  Inpatient Diabetes Program Recommendations: Correction (SSI): While inpatient, may want to consider ordering CBGs with Novolog sensitive correction scale. HgbA1C: A1C 6.9% on 03/15/16 which meets ADA criteria for DM dx. No noted history of DM in chart. MD, please indicate in note if patient will be dx with DM or if patient will be advised to follow up with PCP regarding glycemic control.  Thanks, Orlando PennerMarie Yousof Alderman, RN, MSN, CDE Diabetes Coordinator Inpatient Diabetes Program [phone] (Team Pager from 8am to 5pm) [phone] ([facility] office) [phone] [facility] office) [phone] [facility] office)

## 2016-03-17 NOTE — [place] FL2 (Signed)
[place] MEDICAID FL2 LEVEL OF CARE SCREENING TOOL     IDENTIFICATION  Patient Name: Heidi Miller Birthdate: 20-Dec-1922 Sex: female Admission Date (Current Location): 03/14/2016  [place]   Facility and Address:  [facility], [address], [place], [place] [address]      Provider Number: [id number]  Attending Physician Name and Address:  Auburn BilberryShreyang Patel, MD  Relative Name and Phone Number:       Current Level of Care: Hospital Recommended Level of Care: Skilled Nursing Facility Prior Approval Number:    Date Approved/Denied:   PASRR Number:  [id number])  Discharge Plan: SNF    Current Diagnoses: Patient Active Problem List   Diagnosis Date Noted  . Stroke (HCC) 03/14/2016  . A-fib (HCC) 03/14/2016  . Dementia 03/14/2016  . HTN (hypertension) 03/14/2016  . Hypothyroidism 03/14/2016    Orientation RESPIRATION BLADDER Height & Weight     Self  Normal Continent Weight: 146 lb 8 oz (66.5 kg) Height:  5\' 3"  (160 cm)  BEHAVIORAL SYMPTOMS/MOOD NEUROLOGICAL BOWEL NUTRITION STATUS   (None)  (None) Continent Diet ( DYS 3)  AMBULATORY STATUS COMMUNICATION OF NEEDS Skin   Extensive Assist Verbally Normal                       Personal Care Assistance Level of Assistance  Bathing, Feeding, Dressing Bathing Assistance: Limited assistance Feeding assistance: Independent Dressing Assistance: Limited assistance     Functional Limitations Info  Sight, Hearing, Speech Sight Info: Adequate Hearing Info: Adequate Speech Info: Adequate    SPECIAL CARE FACTORS FREQUENCY  PT (By licensed PT)     PT Frequency:  (5)              Contractures      Additional Factors Info  Code Status, Allergies Code Status Info:  (DNR) Allergies Info:  (No Known Allergies)           Current Medications (03/17/2016):  This is the current hospital active medication list Current Facility-Administered Medications   Medication Dose Route Frequency Provider Last Rate Last Dose  . apixaban (ELIQUIS) tablet 2.5 mg  2.5 mg Oral BID Oralia Manisavid Willis, MD   2.5 mg at 03/17/16 0748  . aspirin chewable tablet 81 mg  81 mg Oral Annett [id number] David Willis, MD   81 mg at 03/17/16 [id number]  . diltiazem (CARDIZEM CD) 24 hr capsule 120 mg  120 mg Oral Daily Oralia Manisavid Willis, MD   120 mg at 03/17/16 0748  . furosemide (LASIX) tablet 20 mg  20 mg Oral Daily Oralia Manisavid Willis, MD   20 mg at 03/17/16 0750  . labetalol (NORMODYNE,TRANDATE) injection 5 mg  5 mg Intravenous Q2H PRN Arnaldo NatalMichael S Diamond, MD   5 mg at 03/15/16 0320  . levothyroxine (SYNTHROID, LEVOTHROID) tablet 100 mcg  100 mcg Oral QAC breakfast Oralia Manisavid Willis, MD   100 mcg at 03/17/16 0748  . memantine (NAMENDA) tablet 10 mg  10 mg Oral BID Oralia Manisavid Willis, MD   10 mg at 03/17/16 0747  . metoprolol tartrate (LOPRESSOR) tablet 25 mg  25 mg Oral Daily Oralia Manisavid Willis, MD   25 mg at 03/17/16 0751  . mirtazapine (REMERON) tablet 22.5 mg  22.5 mg Oral QHS Oralia Manisavid Willis, MD   22.5 mg at 03/16/16 2116  . pantoprazole (PROTONIX) EC tablet 40 mg  40 mg Oral Daily Oralia Manisavid Willis, MD   40 mg at 03/17/16 0750  . QUEtiapine (SEROQUEL XR)  24 hr tablet 50 mg  50 mg Oral QPM Auburn Bilberry, MD   50 mg at 03/16/16 1746  . rivastigmine (EXELON) capsule 1.5 mg  1.5 mg Oral BID WC Auburn Bilberry, MD   1.5 mg at 03/17/16 0748  . spironolactone (ALDACTONE) tablet 12.5 mg  12.5 mg Oral Daily Oralia Manis, MD   12.5 mg at 03/17/16 0750  . venlafaxine XR (EFFEXOR-XR) 24 hr capsule 150 mg  150 mg Oral Daily Oralia Manis, MD   150 mg at 03/17/16 1610     Discharge Medications: Please see discharge summary for a list of discharge medications.  Relevant Imaging Results:  Relevant Lab Results:   Additional Information  (SNN [id number])  Verta Ellen Cauy Melody, LCSW

## 2016-05-16 ENCOUNTER — Emergency Department: Payer: PPO

## 2016-05-16 ENCOUNTER — Inpatient Hospital Stay
Admission: EM | Admit: 2016-05-16 | Discharge: 2016-05-20 | DRG: 481 | Disposition: A | Discharge status: Skilled Nursing Facility | Payer: PPO | Attending: Internal Medicine | Admitting: Internal Medicine

## 2016-05-16 DIAGNOSIS — E039 Hypothyroidism, unspecified: Secondary | ICD-10-CM | POA: Diagnosis present

## 2016-05-16 DIAGNOSIS — R400 Somnolence: Secondary | ICD-10-CM | POA: Diagnosis present

## 2016-05-16 DIAGNOSIS — G309 Alzheimer's disease, unspecified: Secondary | ICD-10-CM | POA: Diagnosis present

## 2016-05-16 DIAGNOSIS — K219 Gastro-esophageal reflux disease without esophagitis: Secondary | ICD-10-CM | POA: Diagnosis present

## 2016-05-16 DIAGNOSIS — Z66 Do not resuscitate: Secondary | ICD-10-CM | POA: Diagnosis present

## 2016-05-16 DIAGNOSIS — J9811 Atelectasis: Secondary | ICD-10-CM | POA: Diagnosis not present

## 2016-05-16 DIAGNOSIS — I255 Ischemic cardiomyopathy: Secondary | ICD-10-CM | POA: Diagnosis present

## 2016-05-16 DIAGNOSIS — F329 Major depressive disorder, single episode, unspecified: Secondary | ICD-10-CM | POA: Diagnosis present

## 2016-05-16 DIAGNOSIS — I110 Hypertensive heart disease with heart failure: Secondary | ICD-10-CM | POA: Diagnosis present

## 2016-05-16 DIAGNOSIS — I482 Chronic atrial fibrillation: Secondary | ICD-10-CM | POA: Diagnosis present

## 2016-05-16 DIAGNOSIS — S72009A Fracture of unspecified part of neck of unspecified femur, initial encounter for closed fracture: Secondary | ICD-10-CM | POA: Diagnosis present

## 2016-05-16 DIAGNOSIS — Z8249 Family history of ischemic heart disease and other diseases of the circulatory system: Secondary | ICD-10-CM

## 2016-05-16 DIAGNOSIS — T50995A Adverse effect of other drugs, medicaments and biological substances, initial encounter: Secondary | ICD-10-CM | POA: Diagnosis not present

## 2016-05-16 DIAGNOSIS — D62 Acute posthemorrhagic anemia: Secondary | ICD-10-CM | POA: Diagnosis not present

## 2016-05-16 DIAGNOSIS — Z7901 Long term (current) use of anticoagulants: Secondary | ICD-10-CM | POA: Diagnosis not present

## 2016-05-16 DIAGNOSIS — S72112A Displaced fracture of greater trochanter of left femur, initial encounter for closed fracture: Secondary | ICD-10-CM | POA: Diagnosis present

## 2016-05-16 DIAGNOSIS — E870 Hyperosmolality and hypernatremia: Secondary | ICD-10-CM | POA: Diagnosis not present

## 2016-05-16 DIAGNOSIS — R262 Difficulty in walking, not elsewhere classified: Secondary | ICD-10-CM

## 2016-05-16 DIAGNOSIS — Z79899 Other long term (current) drug therapy: Secondary | ICD-10-CM

## 2016-05-16 DIAGNOSIS — I700 Atherosclerosis of aorta: Secondary | ICD-10-CM | POA: Diagnosis present

## 2016-05-16 DIAGNOSIS — R0902 Hypoxemia: Secondary | ICD-10-CM

## 2016-05-16 DIAGNOSIS — I2510 Atherosclerotic heart disease of native coronary artery without angina pectoris: Secondary | ICD-10-CM | POA: Diagnosis present

## 2016-05-16 DIAGNOSIS — W010XXA Fall on same level from slipping, tripping and stumbling without subsequent striking against object, initial encounter: Secondary | ICD-10-CM | POA: Diagnosis present

## 2016-05-16 DIAGNOSIS — I5022 Chronic systolic (congestive) heart failure: Secondary | ICD-10-CM | POA: Diagnosis present

## 2016-05-16 DIAGNOSIS — Z9289 Personal history of other medical treatment: Secondary | ICD-10-CM

## 2016-05-16 DIAGNOSIS — Z22322 Carrier or suspected carrier of Methicillin resistant Staphylococcus aureus: Secondary | ICD-10-CM

## 2016-05-16 DIAGNOSIS — Y92009 Unspecified place in unspecified non-institutional (private) residence as the place of occurrence of the external cause: Secondary | ICD-10-CM

## 2016-05-16 DIAGNOSIS — I959 Hypotension, unspecified: Secondary | ICD-10-CM

## 2016-05-16 DIAGNOSIS — D689 Coagulation defect, unspecified: Secondary | ICD-10-CM | POA: Diagnosis not present

## 2016-05-16 DIAGNOSIS — F0280 Dementia in other diseases classified elsewhere without behavioral disturbance: Secondary | ICD-10-CM | POA: Diagnosis present

## 2016-05-16 DIAGNOSIS — N289 Disorder of kidney and ureter, unspecified: Secondary | ICD-10-CM

## 2016-05-16 DIAGNOSIS — Z955 Presence of coronary angioplasty implant and graft: Secondary | ICD-10-CM

## 2016-05-16 DIAGNOSIS — S72002A Fracture of unspecified part of neck of left femur, initial encounter for closed fracture: Secondary | ICD-10-CM

## 2016-05-16 DIAGNOSIS — S72122A Displaced fracture of lesser trochanter of left femur, initial encounter for closed fracture: Secondary | ICD-10-CM | POA: Diagnosis present

## 2016-05-16 DIAGNOSIS — R8281 Pyuria: Secondary | ICD-10-CM

## 2016-05-16 DIAGNOSIS — Z8673 Personal history of transient ischemic attack (TIA), and cerebral infarction without residual deficits: Secondary | ICD-10-CM

## 2016-05-16 DIAGNOSIS — I4891 Unspecified atrial fibrillation: Secondary | ICD-10-CM

## 2016-05-16 DIAGNOSIS — Z7982 Long term (current) use of aspirin: Secondary | ICD-10-CM

## 2016-05-16 DIAGNOSIS — R7303 Prediabetes: Secondary | ICD-10-CM

## 2016-05-16 DIAGNOSIS — Z801 Family history of malignant neoplasm of trachea, bronchus and lung: Secondary | ICD-10-CM

## 2016-05-16 DIAGNOSIS — M25552 Pain in left hip: Secondary | ICD-10-CM | POA: Diagnosis present

## 2016-05-16 HISTORY — DX: Gastro-esophageal reflux disease without esophagitis: K21.9

## 2016-05-16 HISTORY — DX: Major depressive disorder, single episode, unspecified: F32.9

## 2016-05-16 HISTORY — DX: Ischemic cardiomyopathy: I25.5

## 2016-05-16 HISTORY — DX: Depression, unspecified: F32.A

## 2016-05-16 HISTORY — DX: Cerebral infarction, unspecified: I63.9

## 2016-05-16 HISTORY — DX: Atherosclerotic heart disease of native coronary artery without angina pectoris: I25.10

## 2016-05-16 HISTORY — DX: Heart failure, unspecified: I50.9

## 2016-05-16 LAB — BASIC METABOLIC PANEL
ANION GAP: 12 (ref 5–15)
BUN: 27 mg/dL — ABNORMAL HIGH (ref 6–20)
CALCIUM: 9.8 mg/dL (ref 8.9–10.3)
CO2: 24 mmol/L (ref 22–32)
CREATININE: 1.26 mg/dL — AB (ref 0.44–1.00)
Chloride: 105 mmol/L (ref 101–111)
GFR calc Af Amer: 42 mL/min — ABNORMAL LOW (ref >60)
GFR, EST NON AFRICAN AMERICAN: 36 mL/min — AB (ref >60)
GLUCOSE: 209 mg/dL — AB (ref 65–99)
Potassium: 4.4 mmol/L (ref 3.5–5.1)
Sodium: 141 mmol/L (ref 135–145)

## 2016-05-16 LAB — CBC
HCT: 33.3 % — ABNORMAL LOW (ref 35.0–47.0)
Hemoglobin: 10.3 g/dL — ABNORMAL LOW (ref 12.0–16.0)
MCH: 25.1 pg — AB (ref 26.0–34.0)
MCHC: 30.9 g/dL — AB (ref 32.0–36.0)
MCV: 81.3 fL (ref 80.0–100.0)
PLATELETS: 255 10*3/uL (ref 150–440)
RBC: 4.09 MIL/uL (ref 3.80–5.20)
RDW: 17.4 % — AB (ref 11.5–14.5)
WBC: 12 10*3/uL — ABNORMAL HIGH (ref 3.6–11.0)

## 2016-05-16 LAB — MRSA PCR SCREENING: MRSA by PCR: POSITIVE — AB

## 2016-05-16 MED ORDER — DOCUSATE SODIUM 100 MG PO CAPS
100.0000 mg | ORAL_CAPSULE | Freq: Two times a day (BID) | ORAL | Status: DC
  Administered 2016-05-16 (×2): 100 mg via ORAL
  Filled 2016-05-16 (×2): qty 1

## 2016-05-16 MED ORDER — DILTIAZEM HCL ER COATED BEADS 120 MG PO CP24
120.0000 mg | ORAL_CAPSULE | Freq: Every day | ORAL | Status: DC
  Administered 2016-05-16 – 2016-05-20 (×4): 120 mg via ORAL
  Filled 2016-05-16 (×4): qty 1

## 2016-05-16 MED ORDER — PANTOPRAZOLE SODIUM 40 MG PO TBEC
40.0000 mg | DELAYED_RELEASE_TABLET | Freq: Every day | ORAL | Status: DC
  Administered 2016-05-16 – 2016-05-20 (×4): 40 mg via ORAL
  Filled 2016-05-16 (×4): qty 1

## 2016-05-16 MED ORDER — ACETAMINOPHEN 325 MG PO TABS: 650.0000 mg | ORAL_TABLET | Freq: Four times a day (QID) | ORAL | Status: DC | PRN

## 2016-05-16 MED ORDER — ONDANSETRON HCL 4 MG PO TABS: 4.0000 mg | ORAL_TABLET | Freq: Four times a day (QID) | ORAL | Status: DC | PRN

## 2016-05-16 MED ORDER — CEFAZOLIN SODIUM-DEXTROSE 2-4 GM/100ML-% IV SOLN
2.0000 g | INTRAVENOUS | Status: DC
  Filled 2016-05-16: qty 100

## 2016-05-16 MED ORDER — ACETAMINOPHEN 650 MG RE SUPP: 650.0000 mg | Freq: Four times a day (QID) | RECTAL | Status: DC | PRN

## 2016-05-16 MED ORDER — FENTANYL CITRATE (PF) 100 MCG/2ML IJ SOLN
50.0000 ug | Freq: Once | INTRAMUSCULAR | Status: AC
  Administered 2016-05-16: 50 ug via INTRAVENOUS
  Filled 2016-05-16: qty 2

## 2016-05-16 MED ORDER — LEVOTHYROXINE SODIUM 100 MCG PO TABS
100.0000 ug | ORAL_TABLET | Freq: Every day | ORAL | Status: DC
  Administered 2016-05-16 – 2016-05-20 (×4): 100 ug via ORAL
  Filled 2016-05-16 (×3): qty 1

## 2016-05-16 MED ORDER — SENNA 8.6 MG PO TABS
1.0000 | ORAL_TABLET | Freq: Two times a day (BID) | ORAL | Status: DC
  Administered 2016-05-16 – 2016-05-20 (×8): 8.6 mg via ORAL
  Filled 2016-05-16 (×8): qty 1

## 2016-05-16 MED ORDER — QUETIAPINE FUMARATE ER 50 MG PO TB24
50.0000 mg | ORAL_TABLET | Freq: Every evening | ORAL | Status: DC
  Administered 2016-05-16 – 2016-05-19 (×4): 50 mg via ORAL
  Filled 2016-05-16 (×4): qty 1

## 2016-05-16 MED ORDER — RIVASTIGMINE TARTRATE 1.5 MG PO CAPS
1.5000 mg | ORAL_CAPSULE | Freq: Two times a day (BID) | ORAL | Status: DC
  Administered 2016-05-16 – 2016-05-20 (×7): 1.5 mg via ORAL
  Filled 2016-05-16 (×7): qty 1

## 2016-05-16 MED ORDER — OXYCODONE HCL 5 MG PO TABS
5.0000 mg | ORAL_TABLET | ORAL | Status: DC | PRN
  Administered 2016-05-16: 5 mg via ORAL
  Filled 2016-05-16: qty 1

## 2016-05-16 MED ORDER — MUPIROCIN 2 % EX OINT
1.0000 "application " | TOPICAL_OINTMENT | Freq: Two times a day (BID) | CUTANEOUS | Status: DC
  Administered 2016-05-16 – 2016-05-20 (×8): 1 via NASAL
  Filled 2016-05-16: qty 22

## 2016-05-16 MED ORDER — SODIUM CHLORIDE 0.9% FLUSH
3.0000 mL | Freq: Two times a day (BID) | INTRAVENOUS | Status: DC
  Administered 2016-05-16 – 2016-05-20 (×6): 3 mL via INTRAVENOUS

## 2016-05-16 MED ORDER — ONDANSETRON HCL 4 MG/2ML IJ SOLN: 4.0000 mg | Freq: Four times a day (QID) | INTRAMUSCULAR | Status: DC | PRN

## 2016-05-16 MED ORDER — METOPROLOL TARTRATE 25 MG PO TABS
25.0000 mg | ORAL_TABLET | Freq: Every day | ORAL | Status: DC
  Administered 2016-05-16 – 2016-05-19 (×4): 25 mg via ORAL
  Filled 2016-05-16 (×7): qty 1

## 2016-05-16 MED ORDER — MIRTAZAPINE 15 MG PO TABS
22.5000 mg | ORAL_TABLET | Freq: Every day | ORAL | Status: DC
  Administered 2016-05-16 – 2016-05-19 (×4): 22.5 mg via ORAL
  Filled 2016-05-16: qty 1
  Filled 2016-05-16 (×2): qty 2
  Filled 2016-05-16: qty 1

## 2016-05-16 MED ORDER — CHLORHEXIDINE GLUCONATE CLOTH 2 % EX PADS
6.0000 | MEDICATED_PAD | Freq: Every day | CUTANEOUS | Status: DC
  Administered 2016-05-18 – 2016-05-20 (×2): 6 via TOPICAL

## 2016-05-16 MED ORDER — POLYETHYLENE GLYCOL 3350 17 G PO PACK: 17.0000 g | PACK | Freq: Every day | ORAL | Status: DC | PRN

## 2016-05-16 MED ORDER — VENLAFAXINE HCL ER 75 MG PO CP24
150.0000 mg | ORAL_CAPSULE | Freq: Every day | ORAL | Status: DC
  Administered 2016-05-18 – 2016-05-20 (×3): 150 mg via ORAL
  Filled 2016-05-16 (×3): qty 2

## 2016-05-16 MED ORDER — ONDANSETRON HCL 4 MG/2ML IJ SOLN
4.0000 mg | Freq: Once | INTRAMUSCULAR | Status: AC
  Administered 2016-05-16: 4 mg via INTRAVENOUS
  Filled 2016-05-16: qty 2

## 2016-05-16 MED ORDER — MEMANTINE HCL 5 MG PO TABS
10.0000 mg | ORAL_TABLET | Freq: Two times a day (BID) | ORAL | Status: DC
  Administered 2016-05-16 – 2016-05-20 (×8): 10 mg via ORAL
  Filled 2016-05-16 (×8): qty 2

## 2016-05-16 NOTE — Consult Note (Signed)
ORTHOPAEDIC CONSULTATION  REQUESTING PHYSICIAN: Shaune Pollack, MD  Chief Complaint:   Left hip pain.  History of Present Illness: Heidi Miller is a 82 y.o. female with multiple medical problems, including coronary artery disease, congestive heart failure, chronic atrial fibrillation for which she is on Eliquis, recent strokes, hypertension, ischemic cardiomyopathy, thyroid disease, dementia, and depression, who lives independently under the close care of her Heidi Miller. The patient often will use a wheelchair or walker when out of the house. The patient was in her usual state of health this morning when she apparently lost her balance and fell, injuring her left hip. She was found by her caregiver who called EMS. She was brought to the emergency room where x-rays demonstrated a displaced dominant intertrochanteric fracture of her left hip. The patient thinks she may have struck her head when she fell, but a CT scan of her head and neck in the emergency room showed no acute abnormalities. The patient denies any chest pain, lightheadedness, dizziness, shortness of breath, or other symptoms may have precipitated her fall, but she is a poor historian.  Past Medical History:  Diagnosis Date  . Atrial fibrillation (HCC)    on eliquis  . CAD (coronary artery disease)    s/p LAD stent  . CHF (congestive heart failure) (HCC)    systolic dysfunction, EF 20%  . CVA (cerebral vascular accident) (HCC)    in Sept 2017 with left internal capsule infarct  . Dementia   . Depression   . GERD (gastroesophageal reflux disease)   . Hypertension   . Ischemic cardiomyopathy   . Thyroid disease    Past Surgical History:  Procedure Laterality Date  . CARDIAC CATHETERIZATION    . CHOLECYSTECTOMY    . FOOT SURGERY    . HERNIA REPAIR    . resection of left acoustic neuroma    . Right rotator cuff repair    . TONSILLECTOMY     Social History    Social History  . Marital status: Widowed    Spouse name: N/A  . Number of children: N/A  . Years of education: N/A   Social History Main Topics  . Smoking status: Never Smoker  . Smokeless tobacco: Never Used  . Alcohol use No  . Drug use: No  . Sexual activity: Not Asked   Other Topics Concern  . None   Social History Narrative   Lives at home by herself. Has care during the day. Heidi Miller visits every day. Has a walker to ambulate, patient forgets to use it however.   Family History  Problem Relation Age of Onset  . Lung cancer Mother   . Heart attack Father   . CAD Heidi Miller    No Known Allergies Prior to Admission medications   Medication Sig Start Date End Date Taking? Authorizing Provider  apixaban (ELIQUIS) 2.5 MG TABS tablet Take 2.5 mg by mouth 2 (two) times daily.   Yes Historical Provider, MD  aspirin 81 MG chewable tablet Chew 81 mg by mouth every morning.   Yes Historical Provider, MD  cyanocobalamin (,VITAMIN B-12,) 1000 MCG/ML injection Inject 1,000 mcg into the muscle every 30 (thirty) days. On the 16th   Yes Historical Provider, MD  diltiazem (CARDIZEM CD) 120 MG 24 hr capsule Take 120 mg by mouth daily.   Yes Historical Provider, MD  furosemide (LASIX) 20 MG tablet Take 20 mg by mouth daily.   Yes Historical Provider, MD  levothyroxine (SYNTHROID, LEVOTHROID) 100 MCG tablet Take 100  mcg by mouth daily.   Yes Historical Provider, MD  memantine (NAMENDA) 10 MG tablet Take 10 mg by mouth 2 (two) times daily.   Yes Historical Provider, MD  metoprolol (LOPRESSOR) 50 MG tablet Take 25 mg by mouth daily.   Yes Historical Provider, MD  mirtazapine (REMERON) 45 MG tablet Take 22.5 mg by mouth at bedtime.   Yes Historical Provider, MD  omeprazole (PRILOSEC) 20 MG capsule Take 20 mg by mouth daily.   Yes Historical Provider, MD  QUEtiapine (SEROQUEL XR) 50 MG TB24 24 hr tablet Take 50 mg by mouth every evening.   Yes Historical Provider, MD  rivastigmine (EXELON) 1.5  MG capsule Take 1.5 mg by mouth 2 (two) times daily with a meal. 1200 & 1700   Yes Historical Provider, MD  spironolactone (ALDACTONE) 25 MG tablet Take 12.5 mg by mouth daily.   Yes Historical Provider, MD  venlafaxine XR (EFFEXOR-XR) 150 MG 24 hr capsule Take 150 mg by mouth daily.   Yes Historical Provider, MD   Dg Chest 1 View  Result Date: 05/16/2016 CLINICAL DATA:  Larey Seat today with left hip pain, dementia EXAM: CHEST 1 VIEW COMPARISON:  Chest x-ray of 03/15/2016 FINDINGS: No active infiltrate or effusion is seen. Mild cardiomegaly is stable. Mediastinal and hilar contours are unremarkable. There is thoracic aortic atherosclerosis present. No acute bony abnormality is seen. IMPRESSION: 1. Stable cardiomegaly. 2. No active lung disease. 3. Thoracic aortic atherosclerosis. Electronically Signed   By: Dwyane Dee M.D.   On: 05/16/2016 11:12   Ct Head Wo Contrast  Result Date: 05/16/2016 CLINICAL DATA:  Pain following fall EXAM: CT HEAD WITHOUT CONTRAST CT CERVICAL SPINE WITHOUT CONTRAST TECHNIQUE: Multidetector CT imaging of the head and cervical spine was performed following the standard protocol without intravenous contrast. Multiplanar CT image reconstructions of the cervical spine were also generated. COMPARISON:  Head CT March 14, 2016 FINDINGS: CT HEAD FINDINGS Brain: Mild to moderate diffuse atrophy remains. There is no intracranial mass no hemorrhage, extra-axial fluid collection, or midline shift. There is patchy small vessel disease in the centra semiovale bilaterally. There is evidence of a prior infarct in the posterior limb of the right internal capsule, stable. There is evidence of a prior infarct in the medial right occipital lobe, stable. There is evidence of a prior small infarct in the posterior mid left cerebellum posterior to the dentate nucleus on the left. There is also evidence of a prior small infarct in the posterior mid right cerebellum. There is no new gray-white  compartment lesion. No acute infarct evident. Vascular: There is no hyperdense vessel. There is calcification in the carotid siphon regions bilaterally. Skull: The patient has had a previous craniotomy involving the inferolateral left occipital bone, slightly posterior to the mastoids, stable. Bony calvarium otherwise appears intact and stable. Sinuses/Orbits: There is mild mucosal thickening in several ethmoid air cells bilaterally. Visualized paranasal sinuses elsewhere clear. No intraorbital lesions are evident. Other: Mastoid air cells are clear. CT CERVICAL SPINE FINDINGS Alignment: There is no spondylolisthesis. Skull base and vertebrae: Skull base and craniocervical junction regions appear normal. There is no evidence of fracture. No blastic or lytic bone lesions are evident. Soft tissues and spinal canal: Prevertebral soft tissues and predental space regions are normal. There are no paraspinous lesions. There is no spinal stenosis. Disc levels: There is moderate disc space narrowing at C3-4. There is mild disc space narrowing at C4-5 and C5-6. There is facet hypertrophy at multiple levels. There is mild  exit foraminal narrowing on the left at C2-3. There is moderately severe exit foraminal narrowing on the right at C3-4 with impression on the exiting nerve root. There is moderate exit foraminal narrowing at C4-5 bilaterally. There is moderate exit foraminal narrowing on the right at C5-6. No disc extrusion is evident. Upper chest: There is mild scarring in the lung apices. There is extensive atherosclerotic calcification in the aorta. Other: There is calcification in each carotid artery. There is calcification in the right subclavian artery. IMPRESSION: CT head: Atrophy with small vessel disease and prior infarcts, stable. No acute infarct. No hemorrhage, mass, or extra-axial fluid collection. Evidence of previous inferolateral left occipital craniotomy. Bony structures otherwise appear unremarkable. Mild  ethmoid sinus disease bilaterally. Foci of vascular calcification noted. CT cervical spine: No acute fracture or spondylolisthesis. Multilevel arthropathy. Aortic atherosclerosis as well as bilateral carotid artery and right subclavian artery calcification noted. Electronically Signed   By: Bretta Bang III M.D.   On: 05/16/2016 13:09   Ct Cervical Spine Wo Contrast  Result Date: 05/16/2016 CLINICAL DATA:  Pain following fall EXAM: CT HEAD WITHOUT CONTRAST CT CERVICAL SPINE WITHOUT CONTRAST TECHNIQUE: Multidetector CT imaging of the head and cervical spine was performed following the standard protocol without intravenous contrast. Multiplanar CT image reconstructions of the cervical spine were also generated. COMPARISON:  Head CT March 14, 2016 FINDINGS: CT HEAD FINDINGS Brain: Mild to moderate diffuse atrophy remains. There is no intracranial mass no hemorrhage, extra-axial fluid collection, or midline shift. There is patchy small vessel disease in the centra semiovale bilaterally. There is evidence of a prior infarct in the posterior limb of the right internal capsule, stable. There is evidence of a prior infarct in the medial right occipital lobe, stable. There is evidence of a prior small infarct in the posterior mid left cerebellum posterior to the dentate nucleus on the left. There is also evidence of a prior small infarct in the posterior mid right cerebellum. There is no new gray-white compartment lesion. No acute infarct evident. Vascular: There is no hyperdense vessel. There is calcification in the carotid siphon regions bilaterally. Skull: The patient has had a previous craniotomy involving the inferolateral left occipital bone, slightly posterior to the mastoids, stable. Bony calvarium otherwise appears intact and stable. Sinuses/Orbits: There is mild mucosal thickening in several ethmoid air cells bilaterally. Visualized paranasal sinuses elsewhere clear. No intraorbital lesions are  evident. Other: Mastoid air cells are clear. CT CERVICAL SPINE FINDINGS Alignment: There is no spondylolisthesis. Skull base and vertebrae: Skull base and craniocervical junction regions appear normal. There is no evidence of fracture. No blastic or lytic bone lesions are evident. Soft tissues and spinal canal: Prevertebral soft tissues and predental space regions are normal. There are no paraspinous lesions. There is no spinal stenosis. Disc levels: There is moderate disc space narrowing at C3-4. There is mild disc space narrowing at C4-5 and C5-6. There is facet hypertrophy at multiple levels. There is mild exit foraminal narrowing on the left at C2-3. There is moderately severe exit foraminal narrowing on the right at C3-4 with impression on the exiting nerve root. There is moderate exit foraminal narrowing at C4-5 bilaterally. There is moderate exit foraminal narrowing on the right at C5-6. No disc extrusion is evident. Upper chest: There is mild scarring in the lung apices. There is extensive atherosclerotic calcification in the aorta. Other: There is calcification in each carotid artery. There is calcification in the right subclavian artery. IMPRESSION: CT head: Atrophy with small  vessel disease and prior infarcts, stable. No acute infarct. No hemorrhage, mass, or extra-axial fluid collection. Evidence of previous inferolateral left occipital craniotomy. Bony structures otherwise appear unremarkable. Mild ethmoid sinus disease bilaterally. Foci of vascular calcification noted. CT cervical spine: No acute fracture or spondylolisthesis. Multilevel arthropathy. Aortic atherosclerosis as well as bilateral carotid artery and right subclavian artery calcification noted. Electronically Signed   By: Bretta BangWilliam  Woodruff III M.D.   On: 05/16/2016 13:09   Dg Hip Unilat With Pelvis 2-3 Views Left  Result Date: 05/16/2016 CLINICAL DATA:  Larey SeatFell today with pain, history of dementia EXAM: DG HIP (WITH OR WITHOUT PELVIS)  2-3V LEFT COMPARISON:  None. FINDINGS: There is a comminuted acute left intertrochanteric femoral fracture. There appears to be partial avulsion of the greater and lesser trochanters. The left hip joint space is unremarkable. The pelvic rami appear intact. The right hip joint space is unremarkable. The SI joints are corticated. IMPRESSION: 1. Acute comminuted intertrochanteric left femoral fracture. Avulsion of the greater and lesser trochanters. 2. No other acute abnormality. Electronically Signed   By: Dwyane DeePaul  Barry M.D.   On: 05/16/2016 11:11    Positive ROS: All other systems have been reviewed and were otherwise negative with the exception of those mentioned in the HPI and as above.  Physical Exam: General:  Alert, no acute distress Psychiatric:  Patient is not competent for consent, but exhibits normal mood and affect   Cardiovascular:  No pedal edema Respiratory:  No wheezing, non-labored breathing GI:  Abdomen is soft and non-tender Skin:  No lesions in the area of chief complaint Neurologic:  Sensation intact distally Lymphatic:  No axillary or cervical lymphadenopathy  Orthopedic Exam:  Orthopedic examination is limited to the left hip and lower extremity. The left lower extremity is somewhat shortened and externally rotated as compared to the right. Skin inspection around the left hip is unremarkable. There is no swelling, erythema, ecchymosis, or abrasions. She has some discomfort to palpation over the lateral aspect of the left hip. She has more significant pain with any attempted active or passive motion of the left hip or lower extremity. She is neurovascularly intact to her left lower extremity and foot.  X-rays:  X-rays of the pelvis and left hip are available for review. These films demonstrate a comminuted displaced 4 part intratrochanteric fracture of the left hip. No significant degenerative changes of the hip joint are noted.  Assessment: Displaced intertrochanteric fracture  left hip.  Plan: The treatment options are discussed with the patient and her Heidi Miller, who is at the patient's bedside and is the patient's power of attorney, including both surgical and nonsurgical options. The patient's and her Heidi Miller would like to proceed with surgical intervention to include reduction and internal fixation of the intertrochanteric hip fracture with intramedullary device. This procedure has been discussed in detail with the patient and her Heidi Miller, as have the potential risks (including bleeding, infection, nerve and/or blood vessel injury, persistent or recurrent pain, malunion and/or nonunion, need for further surgery, blood clots, strokes, heart attacks and/or arrhythmias, etc.) and benefits. The patient and her Heidi Miller state their understanding and wish to proceed. A formal written consent will be obtained by the nursing staff. The patient has been cleared both medically and by cardiology in preparation for this procedure.  Thank you for ask me to participate in the care of this most pleasant woman. I will be happy to follow her with you.   Maryagnes AmosJ. Jeffrey Lashaya Kienitz, MD  Beeper #:  [phone]  05/16/2016 6:15 PM

## 2016-05-16 NOTE — H&P (Signed)
[employer] - [place] [facility]   PATIENT NAME: Heidi RamMavis Cantero    MR#:  [id number]  DATE OF BIRTH:  1922/09/10  DATE OF ADMISSION:  05/16/2016  PRIMARY CARE PHYSICIAN: BABAOFF, Heidi MesiMARC E, MD   REQUESTING/REFERRING PHYSICIAN: Dr. Governor Rooksebecca Lord  CHIEF COMPLAINT:   Chief Complaint  Patient presents with  . Hip Pain  . Fall    HISTORY OF PRESENT ILLNESS:  Heidi Miller  is a 82 y.o. female with a known history of Atrial fibrillation on eliquis, recent admission 2 months ago for an acute CVA with minimal right facial deficits, Alzheimer's dementia, depression, hypertension, ischemic cardiomyopathy with EF of 20%, hypothyroidism presents from home secondary to a fall and noted to have left hip intertrochanteric fracture. Patient has dementia, pleasantly confused. Unable to provide any history. Most of the history is obtained from her daughter at bedside. After her stroke in September, [facility] and stayed there for almost 3 weeks. At home currently she stays by herself during the nighttime, during the daytime she has caregivers all through the day and the daughter fixes her medications and visits her every day. Patient is supposed to use a walker, however she forgets to use the walker. She is steady with a walker but has had couple of falls recently. This morning when the caregiver went to check on the patient, she has noticed that the patient has walked from her bedroom to the bathroom in spite of having a bedside commode right at the bed, and was noted to be on the ground coming out of the bathroom. Her walker was nowhere seen nearby. Denies any recent fevers, chills, nausea or vomiting. No recent infections or pneumonia. Due to her dementia, patient does not complain of a lot of things. So no awareness of any chest pain or difficulty breathing. She had an acute MI and stent placement several years ago. She has been on eliquis  for her A. fib in spite of which she had a recent stroke. Her EF is noted to be only a 20%. But she does seem like she is compensated at this time. Hospitalists  Are called for admission for her hip fracture and surgical clearance.  PAST MEDICAL HISTORY:   Past Medical History:  Diagnosis Date  . Atrial fibrillation (HCC)    on eliquis  . CAD (coronary artery disease)    s/p LAD stent  . CHF (congestive heart failure) (HCC)    systolic dysfunction, EF 20%  . CVA (cerebral vascular accident) (HCC)    in Sept 2017 with left internal capsule infarct  . Dementia   . Depression   . GERD (gastroesophageal reflux disease)   . Hypertension   . Ischemic cardiomyopathy   . Thyroid disease     PAST SURGICAL HISTORY:   Past Surgical History:  Procedure Laterality Date  . CARDIAC CATHETERIZATION    . CHOLECYSTECTOMY    . FOOT SURGERY    . HERNIA REPAIR    . resection of left acoustic neuroma    . Right rotator cuff repair    . TONSILLECTOMY      SOCIAL HISTORY:   Social History  Substance Use Topics  . Smoking status: Never Smoker  . Smokeless tobacco: Never Used  . Alcohol use No    FAMILY HISTORY:   Family History  Problem Relation Age of Onset  . Lung cancer Mother   . Heart attack Father   . CAD Daughter  DRUG ALLERGIES:  No Known Allergies  REVIEW OF SYSTEMS:   Review of Systems  Unable to perform ROS: Dementia    MEDICATIONS AT HOME:   Prior to Admission medications   Medication Sig Start Date End Date Taking? Authorizing Provider  apixaban (ELIQUIS) 2.5 MG TABS tablet Take 2.5 mg by mouth 2 (two) times daily.    Historical Provider, MD  aspirin 81 MG chewable tablet Chew 81 mg by mouth every morning.    Historical Provider, MD  cephALEXin (KEFLEX) 500 MG capsule Take 1 capsule (500 mg total) by mouth 4 (four) times daily. Patient not taking: Reported on 03/14/2016 05/26/15   Christiane Ha D Cuthriell, PA-C  cyanocobalamin (,VITAMIN B-12,) 1000 MCG/ML  injection Inject 1,000 mcg into the muscle every 30 (thirty) days.    Historical Provider, MD  diltiazem (CARDIZEM CD) 120 MG 24 hr capsule Take 120 mg by mouth daily.    Historical Provider, MD  furosemide (LASIX) 20 MG tablet Take 20 mg by mouth daily.    Historical Provider, MD  levothyroxine (SYNTHROID, LEVOTHROID) 100 MCG tablet Take 100 mcg by mouth daily.    Historical Provider, MD  memantine (NAMENDA) 10 MG tablet Take 10 mg by mouth 2 (two) times daily.    Historical Provider, MD  metoprolol (LOPRESSOR) 50 MG tablet Take 25 mg by mouth daily.    Historical Provider, MD  mirtazapine (REMERON) 45 MG tablet Take 22.5 mg by mouth at bedtime.    Historical Provider, MD  omeprazole (PRILOSEC) 20 MG capsule Take 20 mg by mouth daily.    Historical Provider, MD  QUEtiapine (SEROQUEL XR) 50 MG TB24 24 hr tablet Take 50 mg by mouth every evening.    Historical Provider, MD  rivastigmine (EXELON) 1.5 MG capsule Take 1.5 mg by mouth 2 (two) times daily with a meal.    Historical Provider, MD  spironolactone (ALDACTONE) 25 MG tablet Take 12.5 mg by mouth daily.    Historical Provider, MD  venlafaxine XR (EFFEXOR-XR) 150 MG 24 hr capsule Take 150 mg by mouth daily.    Historical Provider, MD      VITAL SIGNS:  Blood pressure 105/60, pulse 60, temperature 98.7 F (37.1 C), temperature source Oral, resp. rate 20, SpO2 98 %.  PHYSICAL EXAMINATION:   Physical Exam  GENERAL:  82 y.o.-year-old Elderly patient lying in the bed with no acute distress.  EYES: Pupils equal, round, reactive to light and accommodation. No scleral icterus. Extraocular muscles intact.  HEENT: Head atraumatic, normocephalic. Oropharynx and nasopharynx clear.  NECK:  Supple, no jugular venous distention. No thyroid enlargement, no tenderness.  LUNGS: Normal breath sounds bilaterally, no wheezing, rales,rhonchi or crepitation. No use of accessory muscles of respiration. Diminished bibasilar breath sounds CARDIOVASCULAR: S1, S2  regular rhythm, but rate is normal. No  rubs, or gallops. 3/6 systolic murmur is present ABDOMEN: Soft, nontender, nondistended. Bowel sounds present. No organomegaly or mass.  EXTREMITIES: No pedal edema, cyanosis, or clubbing. Left leg is abducted and externally rotated NEUROLOGIC: Cranial nerves II through XII are intact. Muscle strength 5/5 in all extremities except left leg difficulty due to pain from the fracture.. Sensation intact. Gait not checked.  PSYCHIATRIC: The patient is alert and oriented to self.  SKIN: No obvious rash, lesion, or ulcer.   LABORATORY PANEL:   CBC  Recent Labs Lab 05/16/16 1039  WBC 12.0*  HGB 10.3*  HCT 33.3*  PLT 255   ------------------------------------------------------------------------------------------------------------------  Chemistries   Recent Labs Lab 05/16/16 1039  NA 141  K 4.4  CL 105  CO2 24  GLUCOSE 209*  BUN 27*  CREATININE 1.26*  CALCIUM 9.8   ------------------------------------------------------------------------------------------------------------------  Cardiac Enzymes No results for input(s): TROPONINI in the last 168 hours. ------------------------------------------------------------------------------------------------------------------  RADIOLOGY:  Dg Chest 1 View  Result Date: 05/16/2016 CLINICAL DATA:  Larey Seat today with left hip pain, dementia EXAM: CHEST 1 VIEW COMPARISON:  Chest x-ray of 03/15/2016 FINDINGS: No active infiltrate or effusion is seen. Mild cardiomegaly is stable. Mediastinal and hilar contours are unremarkable. There is thoracic aortic atherosclerosis present. No acute bony abnormality is seen. IMPRESSION: 1. Stable cardiomegaly. 2. No active lung disease. 3. Thoracic aortic atherosclerosis. Electronically Signed   By: Dwyane Dee M.D.   On: 05/16/2016 11:12   Dg Hip Unilat With Pelvis 2-3 Views Left  Result Date: 05/16/2016 CLINICAL DATA:  Larey Seat today with pain, history of dementia EXAM: DG  HIP (WITH OR WITHOUT PELVIS) 2-3V LEFT COMPARISON:  None. FINDINGS: There is a comminuted acute left intertrochanteric femoral fracture. There appears to be partial avulsion of the greater and lesser trochanters. The left hip joint space is unremarkable. The pelvic rami appear intact. The right hip joint space is unremarkable. The SI joints are corticated. IMPRESSION: 1. Acute comminuted intertrochanteric left femoral fracture. Avulsion of the greater and lesser trochanters. 2. No other acute abnormality. Electronically Signed   By: Dwyane Dee M.D.   On: 05/16/2016 11:11    EKG:   Orders placed or performed during the hospital encounter of 05/16/16  . ED EKG  . ED EKG    IMPRESSION AND PLAN:   Tiphany Fayson  is a 82 y.o. female with a known history of Atrial fibrillation on eliquis, recent admission 2 months ago for an acute CVA with minimal right facial deficits, Alzheimer's dementia, depression, hypertension, ischemic cardiomyopathy with EF of 20%, hypothyroidism presents from home secondary to a fall and noted to have left hip intertrochanteric fracture.  #1 left hip fracture-left hip comminuted intertrochanteric fracture. Orthopedics consulted. -Due to recent stroke, atrial fibrillation, congestive heart failure, CAD history and her age patient will be a moderate to high risk for noncardiac surgery. -Cardiology consult requested for cardiac clearance. -Holding eliquis and aspirin. Since last dose was last evening, patient should be okay for surgery later today or tomorrow morning. -Postoperative physical therapy consult. Pain management until then. Might need rehabilitation placement.  #2 atrial fibrillation-rate controlled. Continue metoprolol and Cardizem. Hold anticoagulation at this time.  #3 recent acute CVA-acute CVA in September 2017. Was on aspirin and eliquis. Likely embolic infarcts from her A. fib. -Monitor closely as her anticoagulants will be held  #4 ischemic cardiomyopathy  with EF of 20%-seems to be well compensated at this time. Hold Lasix and Aldactone due to hypotension at this time. -Monitor closely any signs of CHF  #5 dementia-pleasantly confused at baseline. Monitor for any delirium postoperatively. -Continue home medications. Patient on Namenda, Exelon. Also continue her antidepressants  #6 hypertension- continue her Cardizem and metoprolol. Monitor blood pressure as it seems to be low normal at this time  #7 DVT prophylaxis-currently on hold due to possible surgery. Last dose of eliquis was last night.   Plan discussed with daughter at bedside  All the records are reviewed and case discussed with ED provider. Management plans discussed with the patient, family and they are in agreement.  CODE STATUS: DO NOT RESUSCITATE. Discussed with daughter Heidi Miller at bedside who has confirmed the DO NOT RESUSCITATE status on this patient.  TOTAL TIME TAKING CARE OF THIS PATIENT: 50 minutes.    Vasili Fok M.D on 05/16/2016 at 1:04 PM  Between 7am to 6pm - Pager - [phone]  After 6pm go to [email or web address] - password [email or web address]  [facility]  Office  [phone]  CC: Primary care physician; BABAOFF, Heidi MesiMARC E, MD

## 2016-05-16 NOTE — ED Notes (Signed)
[phone] Laura from [facility] called to check on pt.  

## 2016-05-16 NOTE — Consult Note (Signed)
Reason for Consult: Preop clearance for hip atrial fibrillation on her course Referring Physician: Dr. Andrey Farmer 30, Dr. Cherly Hensen, Dr. Baldemar Lenis primary Cardiologist Dr. Thalia Party Heidi Miller is an 82 y.o. female.  HPI: Patient's a 82 year old female lives at home alone with help has a history of atrial fibrillation on [place]'s history of CVA history of dementia renal insufficiency cardiomyopathy systolic dysfunction congestive heart failure compensated who recently suffered in the accidental fall while at home suffering a fracture of the left hip. Patient was brought and admitted and now is preop for hip surgery patient unfortunately is on [place] as reportedly her last dose was Friday afternoon 05/16/2016. Patient denies any history of bleeding has had chronic atrial fibrillation and ischemic cardiomyopathy. Patient denies any shortness of breath or chest pain feels reasonably well Lying still in bed.  Past Medical History:  Diagnosis Date  . Atrial fibrillation ([place])    on eliquis  . CAD (coronary artery disease)    s/p LAD stent  . CHF (congestive heart failure) (HCC)    systolic dysfunction, EF 40%  . CVA (cerebral vascular accident) ([place])    in Sept 2017 with left internal capsule infarct  . Dementia   . Depression   . GERD (gastroesophageal reflux disease)   . Hypertension   . Ischemic cardiomyopathy   . Thyroid disease     Past Surgical History:  Procedure Laterality Date  . CARDIAC CATHETERIZATION    . CHOLECYSTECTOMY    . FOOT SURGERY    . HERNIA REPAIR    . resection of left acoustic neuroma    . Right rotator cuff repair    . TONSILLECTOMY      Family History  Problem Relation Age of Onset  . Lung cancer Mother   . Heart attack Father   . CAD Daughter     Social History:  reports that she has never smoked. She has never used smokeless tobacco. She reports that she does not drink alcohol or use drugs.  Allergies: No Known Allergies  Medications: I have  reviewed the patient's current medications.  Results for orders placed or performed during the hospital encounter of 05/16/16 (from the past 48 hour(s))  Basic metabolic panel     Status: Abnormal   Collection Time: 05/16/16 10:39 AM  Result Value Ref Range   Sodium 141 135 - 145 mmol/L   Potassium 4.4 3.5 - 5.1 mmol/L   Chloride 105 101 - 111 mmol/L   CO2 24 22 - 32 mmol/L   Glucose, Bld 209 (H) 65 - 99 mg/dL   BUN 27 (H) 6 - 20 mg/dL   Creatinine, Ser 1.26 (H) 0.44 - 1.00 mg/dL   Calcium 9.8 8.9 - 10.3 mg/dL   GFR calc non Af Amer 36 (L) >60 mL/min   GFR calc Af Amer 42 (L) >60 mL/min    Comment: (NOTE) The eGFR has been calculated using the CKD EPI equation. This calculation has not been validated in all clinical situations. eGFR's persistently <60 mL/min signify possible Chronic Kidney Disease.    Anion gap 12 5 - 15  CBC     Status: Abnormal   Collection Time: 05/16/16 10:39 AM  Result Value Ref Range   WBC 12.0 (H) 3.6 - 11.0 K/uL   RBC 4.09 3.80 - 5.20 MIL/uL   Hemoglobin 10.3 (L) 12.0 - 16.0 g/dL   HCT 33.3 (L) 35.0 - 47.0 %   MCV 81.3 80.0 - 100.0 fL   MCH 25.1 (L)  26.0 - 34.0 pg   MCHC 30.9 (L) 32.0 - 36.0 g/dL   RDW 17.4 (H) 11.5 - 14.5 %   Platelets 255 150 - 440 K/uL    Dg Chest 1 View  Result Date: 05/16/2016 CLINICAL DATA:  [place] today with left hip pain, dementia EXAM: CHEST 1 VIEW COMPARISON:  Chest x-ray of 03/15/2016 FINDINGS: No active infiltrate or effusion is seen. Mild cardiomegaly is stable. Mediastinal and hilar contours are unremarkable. There is thoracic aortic atherosclerosis present. No acute bony abnormality is seen. IMPRESSION: 1. Stable cardiomegaly. 2. No active lung disease. 3. Thoracic aortic atherosclerosis. Electronically Signed   By: Ivar Drape M.D.   On: 05/16/2016 11:12   Ct Head Wo Contrast  Result Date: 05/16/2016 CLINICAL DATA:  Pain following fall EXAM: CT HEAD WITHOUT CONTRAST CT CERVICAL SPINE WITHOUT CONTRAST TECHNIQUE:  Multidetector CT imaging of the head and cervical spine was performed following the standard protocol without intravenous contrast. Multiplanar CT image reconstructions of the cervical spine were also generated. COMPARISON:  Head CT March 14, 2016 FINDINGS: CT HEAD FINDINGS Brain: Mild to moderate diffuse atrophy remains. There is no intracranial mass no hemorrhage, extra-axial fluid collection, or midline shift. There is patchy small vessel disease in the centra semiovale bilaterally. There is evidence of a prior infarct in the posterior limb of the right internal capsule, stable. There is evidence of a prior infarct in the medial right occipital lobe, stable. There is evidence of a prior small infarct in the posterior mid left cerebellum posterior to the dentate nucleus on the left. There is also evidence of a prior small infarct in the posterior mid right cerebellum. There is no new gray-white compartment lesion. No acute infarct evident. Vascular: There is no hyperdense vessel. There is calcification in the carotid siphon regions bilaterally. Skull: The patient has had a previous craniotomy involving the inferolateral left occipital bone, slightly posterior to the mastoids, stable. Bony calvarium otherwise appears intact and stable. Sinuses/Orbits: There is mild mucosal thickening in several ethmoid air cells bilaterally. Visualized paranasal sinuses elsewhere clear. No intraorbital lesions are evident. Other: Mastoid air cells are clear. CT CERVICAL SPINE FINDINGS Alignment: There is no spondylolisthesis. Skull base and vertebrae: Skull base and craniocervical junction regions appear normal. There is no evidence of fracture. No blastic or lytic bone lesions are evident. Soft tissues and spinal canal: Prevertebral soft tissues and predental space regions are normal. There are no paraspinous lesions. There is no spinal stenosis. Disc levels: There is moderate disc space narrowing at C3-4. There is mild disc  space narrowing at C4-5 and C5-6. There is facet hypertrophy at multiple levels. There is mild exit foraminal narrowing on the left at C2-3. There is moderately severe exit foraminal narrowing on the right at C3-4 with impression on the exiting nerve root. There is moderate exit foraminal narrowing at C4-5 bilaterally. There is moderate exit foraminal narrowing on the right at C5-6. No disc extrusion is evident. Upper chest: There is mild scarring in the lung apices. There is extensive atherosclerotic calcification in the aorta. Other: There is calcification in each carotid artery. There is calcification in the right subclavian artery. IMPRESSION: CT head: Atrophy with small vessel disease and prior infarcts, stable. No acute infarct. No hemorrhage, mass, or extra-axial fluid collection. Evidence of previous inferolateral left occipital craniotomy. Bony structures otherwise appear unremarkable. Mild ethmoid sinus disease bilaterally. Foci of vascular calcification noted. CT cervical spine: No acute fracture or spondylolisthesis. Multilevel arthropathy. Aortic atherosclerosis as well  as bilateral carotid artery and right subclavian artery calcification noted. Electronically Signed   By: Lowella Grip III M.D.   On: 05/16/2016 13:09   Ct Cervical Spine Wo Contrast  Result Date: 05/16/2016 CLINICAL DATA:  Pain following fall EXAM: CT HEAD WITHOUT CONTRAST CT CERVICAL SPINE WITHOUT CONTRAST TECHNIQUE: Multidetector CT imaging of the head and cervical spine was performed following the standard protocol without intravenous contrast. Multiplanar CT image reconstructions of the cervical spine were also generated. COMPARISON:  Head CT March 14, 2016 FINDINGS: CT HEAD FINDINGS Brain: Mild to moderate diffuse atrophy remains. There is no intracranial mass no hemorrhage, extra-axial fluid collection, or midline shift. There is patchy small vessel disease in the centra semiovale bilaterally. There is evidence of a  prior infarct in the posterior limb of the right internal capsule, stable. There is evidence of a prior infarct in the medial right occipital lobe, stable. There is evidence of a prior small infarct in the posterior mid left cerebellum posterior to the dentate nucleus on the left. There is also evidence of a prior small infarct in the posterior mid right cerebellum. There is no new gray-white compartment lesion. No acute infarct evident. Vascular: There is no hyperdense vessel. There is calcification in the carotid siphon regions bilaterally. Skull: The patient has had a previous craniotomy involving the inferolateral left occipital bone, slightly posterior to the mastoids, stable. Bony calvarium otherwise appears intact and stable. Sinuses/Orbits: There is mild mucosal thickening in several ethmoid air cells bilaterally. Visualized paranasal sinuses elsewhere clear. No intraorbital lesions are evident. Other: Mastoid air cells are clear. CT CERVICAL SPINE FINDINGS Alignment: There is no spondylolisthesis. Skull base and vertebrae: Skull base and craniocervical junction regions appear normal. There is no evidence of fracture. No blastic or lytic bone lesions are evident. Soft tissues and spinal canal: Prevertebral soft tissues and predental space regions are normal. There are no paraspinous lesions. There is no spinal stenosis. Disc levels: There is moderate disc space narrowing at C3-4. There is mild disc space narrowing at C4-5 and C5-6. There is facet hypertrophy at multiple levels. There is mild exit foraminal narrowing on the left at C2-3. There is moderately severe exit foraminal narrowing on the right at C3-4 with impression on the exiting nerve root. There is moderate exit foraminal narrowing at C4-5 bilaterally. There is moderate exit foraminal narrowing on the right at C5-6. No disc extrusion is evident. Upper chest: There is mild scarring in the lung apices. There is extensive atherosclerotic  calcification in the aorta. Other: There is calcification in each carotid artery. There is calcification in the right subclavian artery. IMPRESSION: CT head: Atrophy with small vessel disease and prior infarcts, stable. No acute infarct. No hemorrhage, mass, or extra-axial fluid collection. Evidence of previous inferolateral left occipital craniotomy. Bony structures otherwise appear unremarkable. Mild ethmoid sinus disease bilaterally. Foci of vascular calcification noted. CT cervical spine: No acute fracture or spondylolisthesis. Multilevel arthropathy. Aortic atherosclerosis as well as bilateral carotid artery and right subclavian artery calcification noted. Electronically Signed   By: Lowella Grip III M.D.   On: 05/16/2016 13:09   Dg Hip Unilat With Pelvis 2-3 Views Left  Result Date: 05/16/2016 CLINICAL DATA:  [place] today with pain, history of dementia EXAM: DG HIP (WITH OR WITHOUT PELVIS) 2-3V LEFT COMPARISON:  None. FINDINGS: There is a comminuted acute left intertrochanteric femoral fracture. There appears to be partial avulsion of the greater and lesser trochanters. The left hip joint space is unremarkable. The pelvic  rami appear intact. The right hip joint space is unremarkable. The SI joints are corticated. IMPRESSION: 1. Acute comminuted intertrochanteric left femoral fracture. Avulsion of the greater and lesser trochanters. 2. No other acute abnormality. Electronically Signed   By: Ivar Drape M.D.   On: 05/16/2016 11:11    Review of Systems  Constitutional: Positive for malaise/fatigue.  HENT: Negative.   Eyes: Negative.   Respiratory: Negative.   Cardiovascular: Negative.   Gastrointestinal: Negative.   Genitourinary: Negative.   Musculoskeletal: Positive for falls, joint pain and myalgias.  Skin: Negative.   Neurological: Positive for weakness.  Endo/Heme/Allergies: Negative.   Psychiatric/Behavioral: Negative.    Blood pressure 133/61, pulse (!) 109, temperature 98.7 F  (37.1 C), temperature source Oral, resp. rate 18, height _0  (1.6 m), weight 64.9 kg (143 lb), SpO2 97 %. Physical Exam  Assessment/Plan: 1 preop clearance for hip surgery 2 coagulopathy secondary Eliquis 3 atrial fibrillation chronic 4 CVA by history 5 congestive heart failure systolic dysfunction 6 ischemic cardiomyopathy ejection fraction around 20% 7 dementia 8 falls 9 hypertension 10 mild renal insufficiency 11 coronary artery disease history of PCI and stent to LAD . PLAN Agree with admit to orthopedics Patient should be an acceptable moderate surgical risk  Would recommend holding all across for 36-48 hours prior to proceeding with surgery Continue rate control for atrial fibrillation Agree with omeprazole for reflux symptoms Continue Aldactone Cardizem and Lopressor for blood pressure control Thyroid disease continue to maintain on levothyroxin therapy Agree with Namenda for dementia Continue Remeron and Seroquel as needed We'll continue to follow with you pre-and postop  Elloise Roark D Jojo Geving 05/16/2016, 4:22 PM

## 2016-05-16 NOTE — ED Provider Notes (Signed)
[facility] Emergency Department Provider Note ____________________________________________   I have reviewed the triage vital signs and the triage nursing note.  HISTORY  Chief Complaint Hip Pain and Fall   Historian [name], patient with dementia Daughter provides some history that the patient lives alone and has a caregiver that comes and in the mornings and found her on the floor complaining of left hip pain  HPI Heidi Miller is a 82 y.o. female who lives alone, was found on the floor by caregiver this morning. Unclear when she may have fallen. The patient herself states that she thinks she might have struck her head. She's complaining of left hip pain. She seems to be at baseline mental status per the daughter, although she is confused and has dementia. She is denying chest pain or trouble breathing or abdominal pain. Hip pain is moderate to severe and worse with any range of motion.    Past Medical History:  Diagnosis Date  . Atrial fibrillation (HCC)    on eliquis  . CAD (coronary artery disease)    s/p LAD stent  . CHF (congestive heart failure) (HCC)    systolic dysfunction, EF 20%  . CVA (cerebral vascular accident) (HCC)    in Sept 2017 with left internal capsule infarct  . Dementia   . Depression   . GERD (gastroesophageal reflux disease)   . Hypertension   . Ischemic cardiomyopathy   . Thyroid disease     Patient Active Problem List   Diagnosis Date Noted  . Hip fracture (HCC) 05/16/2016  . Stroke (HCC) 03/14/2016  . A-fib (HCC) 03/14/2016  . Dementia 03/14/2016  . HTN (hypertension) 03/14/2016  . Hypothyroidism 03/14/2016    Past Surgical History:  Procedure Laterality Date  . CARDIAC CATHETERIZATION    . CHOLECYSTECTOMY    . FOOT SURGERY    . HERNIA REPAIR    . resection of left acoustic neuroma    . Right rotator cuff repair    . TONSILLECTOMY      Prior to Admission medications   Medication Sig Start Date End Date  Taking? Authorizing Provider  apixaban (ELIQUIS) 2.5 MG TABS tablet Take 2.5 mg by mouth 2 (two) times daily.   Yes Historical Provider, MD  aspirin 81 MG chewable tablet Chew 81 mg by mouth every morning.   Yes Historical Provider, MD  cyanocobalamin (,VITAMIN B-12,) 1000 MCG/ML injection Inject 1,000 mcg into the muscle every 30 (thirty) days. On the 16th   Yes Historical Provider, MD  diltiazem (CARDIZEM CD) 120 MG 24 hr capsule Take 120 mg by mouth daily.   Yes Historical Provider, MD  furosemide (LASIX) 20 MG tablet Take 20 mg by mouth daily.   Yes Historical Provider, MD  levothyroxine (SYNTHROID, LEVOTHROID) 100 MCG tablet Take 100 mcg by mouth daily.   Yes Historical Provider, MD  memantine (NAMENDA) 10 MG tablet Take 10 mg by mouth 2 (two) times daily.   Yes Historical Provider, MD  metoprolol (LOPRESSOR) 50 MG tablet Take 25 mg by mouth daily.   Yes Historical Provider, MD  mirtazapine (REMERON) 45 MG tablet Take 22.5 mg by mouth at bedtime.   Yes Historical Provider, MD  omeprazole (PRILOSEC) 20 MG capsule Take 20 mg by mouth daily.   Yes Historical Provider, MD  QUEtiapine (SEROQUEL XR) 50 MG TB24 24 hr tablet Take 50 mg by mouth every evening.   Yes Historical Provider, MD  rivastigmine (EXELON) 1.5 MG capsule Take 1.5 mg by  mouth 2 (two) times daily with a meal. 1200 & 1700   Yes Historical Provider, MD  spironolactone (ALDACTONE) 25 MG tablet Take 12.5 mg by mouth daily.   Yes Historical Provider, MD  venlafaxine XR (EFFEXOR-XR) 150 MG 24 hr capsule Take 150 mg by mouth daily.   Yes Historical Provider, MD    No Known Allergies  Family History  Problem Relation Age of Onset  . Lung cancer Mother   . Heart attack Father   . CAD Daughter     Social History Social History  Substance Use Topics  . Smoking status: Never Smoker  . Smokeless tobacco: Never Used  . Alcohol use No    Review of Systems  Constitutional: Negative for Recent illnesses. Eyes: Negative for visual  changes. ENT: Negative for sore throat. Cardiovascular: Negative for chest pain. Respiratory: Negative for shortness of breath. Gastrointestinal: Negative for abdominal pain, vomiting and diarrhea. Genitourinary: Negative for dysuria. Musculoskeletal: Negative for back pain. Skin: Negative for rash. Neurological: Negative for headache. 10 point Review of Systems otherwise negative ____________________________________________   PHYSICAL EXAM:  VITAL SIGNS: ED Triage Vitals [05/16/16 1027]  Enc Vitals Group     BP 134/70     Pulse Rate (!) 105     Resp (!) 22     Temp 98.7 F (37.1 C)     Temp Source Oral     SpO2 95 %     Weight      Height      Head Circumference      Peak Flow      Pain Score      Pain Loc      Pain Edu?      Excl. in GC?      Constitutional: Alert and Cooperative, but poor historian and disoriented. Well appearing and in no distress. Moans when she moves due to pain in the left hip. HEENT   Head: Normocephalic and atraumatic.      Eyes: Conjunctivae are normal. PERRL. Normal extraocular movements.      Ears:         Nose: No congestion/rhinnorhea.   Mouth/Throat: Mucous membranes are moist.   Neck: No stridor. Cardiovascular/Chest: Normal rate, irregularly irregular.  No murmurs, rubs, or gallops. Respiratory: Normal respiratory effort without tachypnea nor retractions. Breath sounds are clear and equal bilaterally. No wheezes/rales/rhonchi. Gastrointestinal: Soft. No distention, no guarding, no rebound. Nontender.    Genitourinary/rectal:Deferred Musculoskeletal: Pelvis stable, left hip with any range of motion. Left leg not shortened or rotated. No joint effusions.   No edema. Neurologic:  Dementia/disoriented/poor historian, but no slurred speech and follows commands. No facial droop. Moving 4 extremities although left leg is limited due to pain. No apparent sensory abnormalities.  Skin:  Skin is warm, dry and intact. No rash noted.   Few areas of small old appearing bruises on the extremities. Psychiatric: No hallucinations.   ____________________________________________  LABS (pertinent positives/negatives)  Labs Reviewed  BASIC METABOLIC PANEL - Abnormal; Notable for the following:       Result Value   Glucose, Bld 209 (*)    BUN 27 (*)    Creatinine, Ser 1.26 (*)    GFR calc non Af Amer 36 (*)    GFR calc Af Amer 42 (*)    All other components within normal limits  CBC - Abnormal; Notable for the following:    WBC 12.0 (*)    Hemoglobin 10.3 (*)    HCT 33.3 (*)  MCH 25.1 (*)    MCHC 30.9 (*)    RDW 17.4 (*)    All other components within normal limits    ____________________________________________    EKG I, Governor Rooksebecca Nedda Gains, MD, the attending physician have personally viewed and interpreted all ECGs.  97 bpm. Atrial fibrillation. Nonspecific intraventricular conduction delay. Nonspecific ST and T-wave ____________________________________________  RADIOLOGY All Xrays were viewed by me. Imaging interpreted by Radiologist.  Hip left with pelvis:  IMPRESSION: 1. Acute comminuted intertrochanteric left femoral fracture. Avulsion of the greater and lesser trochanters. 2. No other acute abnormality.  Chest 1 view xray:  IMPRESSION: 1. Stable cardiomegaly. 2. No active lung disease. 3. Thoracic aortic atherosclerosis.  CT head without contrast and C-spine without contrast:   IMPRESSION: CT head: Atrophy with small vessel disease and prior infarcts, stable. No acute infarct. No hemorrhage, mass, or extra-axial fluid collection. Evidence of previous inferolateral left occipital craniotomy. Bony structures otherwise appear unremarkable. Mild ethmoid sinus disease bilaterally. Foci of vascular calcification noted.  CT cervical spine: No acute fracture or spondylolisthesis. Multilevel arthropathy. Aortic atherosclerosis as well as bilateral carotid artery and right subclavian artery  calcification noted. __________________________________________  PROCEDURES  Procedure(s) performed: None  Critical Care performed: None  ____________________________________________   ED COURSE / ASSESSMENT AND PLAN  Pertinent labs & imaging results that were available during my care of the patient were reviewed by me and considered in my medical decision making (see chart for details).   Ms. Marletta LorCarver is evaluated after being found on the floor, only clear potential injury to his left hip pain and she does indeed have a left intertrochanteric fracture there. Given the fact that she is on Eloquis, dementia, elderly, and had an unwitnessed fall I did speak with her daughter about obtaining head CT and cervical spine imaging to ensure no injuries there.  Discussion about timing of surgery and whether or not to reverse the Eloquis anticoagulation will be made with hospitalist/ortho.   CONSULTATIONS:   Orthopedic surgeon Dr. Joice LoftsPoggi by phone will consult in hospital -- and I spoke with hospitalist for admission.   Patient / Family / Caregiver informed of clinical course, medical decision-making process, and agree with plan.    ___________________________________________   FINAL CLINICAL IMPRESSION(S) / ED DIAGNOSES   Final diagnoses:  Closed left hip fracture, initial encounter [facility](HCC)              Note: This dictation was prepared with Dragon dictation. Any transcriptional errors that result from this process are unintentional    Governor Rooksebecca Denicia Pagliarulo, MD 05/16/16 1353

## 2016-05-16 NOTE — ED Triage Notes (Signed)
Pt came to ED via EMS from home after fall this morning. Pt has history of dementia, unable to assess how pt fell or if pt was dizzy before falling. Pt c/o left hip pain.

## 2016-05-17 ENCOUNTER — Inpatient Hospital Stay: Payer: PPO | Admitting: Anesthesiology

## 2016-05-17 ENCOUNTER — Inpatient Hospital Stay: Payer: PPO

## 2016-05-17 ENCOUNTER — Encounter
Admission: EM | Disposition: A | Discharge status: Skilled Nursing Facility | Payer: Self-pay | Source: Home / Self Care | Attending: Internal Medicine

## 2016-05-17 HISTORY — PX: INTRAMEDULLARY (IM) NAIL INTERTROCHANTERIC: SHX5875

## 2016-05-17 LAB — BASIC METABOLIC PANEL
ANION GAP: 6 (ref 5–15)
BUN: 33 mg/dL — AB (ref 6–20)
CALCIUM: 8.8 mg/dL — AB (ref 8.9–10.3)
CO2: 29 mmol/L (ref 22–32)
Chloride: 107 mmol/L (ref 101–111)
Creatinine, Ser: 1.58 mg/dL — ABNORMAL HIGH (ref 0.44–1.00)
GFR calc Af Amer: 32 mL/min — ABNORMAL LOW (ref >60)
GFR calc non Af Amer: 27 mL/min — ABNORMAL LOW (ref >60)
GLUCOSE: 170 mg/dL — AB (ref 65–99)
Potassium: 4.7 mmol/L (ref 3.5–5.1)
Sodium: 142 mmol/L (ref 135–145)

## 2016-05-17 LAB — URINALYSIS COMPLETE WITH MICROSCOPIC (ARMC ONLY)
BILIRUBIN URINE: NEGATIVE
Glucose, UA: 50 mg/dL — AB
Ketones, ur: NEGATIVE mg/dL
Nitrite: NEGATIVE
PH: 5 (ref 5.0–8.0)
Protein, ur: 30 mg/dL — AB
Specific Gravity, Urine: 1.021 (ref 1.005–1.030)

## 2016-05-17 LAB — CBC
HEMATOCRIT: 25.8 % — AB (ref 35.0–47.0)
HEMOGLOBIN: 8.1 g/dL — AB (ref 12.0–16.0)
MCH: 25.3 pg — AB (ref 26.0–34.0)
MCHC: 31.5 g/dL — AB (ref 32.0–36.0)
MCV: 80.1 fL (ref 80.0–100.0)
Platelets: 188 10*3/uL (ref 150–440)
RBC: 3.22 MIL/uL — ABNORMAL LOW (ref 3.80–5.20)
RDW: 17.5 % — ABNORMAL HIGH (ref 11.5–14.5)
WBC: 11.7 10*3/uL — ABNORMAL HIGH (ref 3.6–11.0)

## 2016-05-17 SURGERY — FIXATION, FRACTURE, INTERTROCHANTERIC, WITH INTRAMEDULLARY ROD
Anesthesia: General | Laterality: Left

## 2016-05-17 MED ORDER — FENTANYL CITRATE (PF) 100 MCG/2ML IJ SOLN: 25.0000 ug | INTRAMUSCULAR | Status: DC | PRN

## 2016-05-17 MED ORDER — ONDANSETRON HCL 4 MG PO TABS: 4.0000 mg | ORAL_TABLET | Freq: Four times a day (QID) | ORAL | Status: DC | PRN

## 2016-05-17 MED ORDER — LACTATED RINGERS IV SOLN
INTRAVENOUS | Status: DC | PRN
  Administered 2016-05-17: 10:00:00 via INTRAVENOUS

## 2016-05-17 MED ORDER — PROMETHAZINE HCL 25 MG/ML IJ SOLN: 6.2500 mg | INTRAMUSCULAR | Status: DC | PRN

## 2016-05-17 MED ORDER — BUPIVACAINE-EPINEPHRINE (PF) 0.5% -1:200000 IJ SOLN
INTRAMUSCULAR | Status: DC | PRN
  Administered 2016-05-17: 30 mL via PERINEURAL

## 2016-05-17 MED ORDER — ONDANSETRON HCL 4 MG/2ML IJ SOLN: 4.0000 mg | Freq: Four times a day (QID) | INTRAMUSCULAR | Status: DC | PRN

## 2016-05-17 MED ORDER — HYDROMORPHONE HCL 1 MG/ML IJ SOLN: 0.5000 mg | INTRAMUSCULAR | Status: DC | PRN

## 2016-05-17 MED ORDER — DIPHENHYDRAMINE HCL 12.5 MG/5ML PO ELIX: 12.5000 mg | ORAL_SOLUTION | ORAL | Status: DC | PRN

## 2016-05-17 MED ORDER — MAGNESIUM HYDROXIDE 400 MG/5ML PO SUSP
30.0000 mL | Freq: Every day | ORAL | Status: DC | PRN
  Administered 2016-05-19: 30 mL via ORAL
  Filled 2016-05-17: qty 30

## 2016-05-17 MED ORDER — DOCUSATE SODIUM 100 MG PO CAPS
100.0000 mg | ORAL_CAPSULE | Freq: Two times a day (BID) | ORAL | Status: DC
  Administered 2016-05-17 – 2016-05-20 (×6): 100 mg via ORAL
  Filled 2016-05-17 (×6): qty 1

## 2016-05-17 MED ORDER — PROPOFOL 10 MG/ML IV BOLUS
INTRAVENOUS | Status: DC | PRN
  Administered 2016-05-17: 80 mg via INTRAVENOUS

## 2016-05-17 MED ORDER — NEOMYCIN-POLYMYXIN B GU 40-200000 IR SOLN
Status: DC | PRN
  Administered 2016-05-17: 2 mL

## 2016-05-17 MED ORDER — NEOMYCIN-POLYMYXIN B GU 40-200000 IR SOLN
Status: AC
  Filled 2016-05-17: qty 2

## 2016-05-17 MED ORDER — METOCLOPRAMIDE HCL 5 MG/ML IJ SOLN: 5.0000 mg | Freq: Three times a day (TID) | INTRAMUSCULAR | Status: DC | PRN

## 2016-05-17 MED ORDER — METOCLOPRAMIDE HCL 10 MG PO TABS: 5.0000 mg | ORAL_TABLET | Freq: Three times a day (TID) | ORAL | Status: DC | PRN

## 2016-05-17 MED ORDER — BISACODYL 10 MG RE SUPP: 10.0000 mg | Freq: Every day | RECTAL | Status: DC | PRN

## 2016-05-17 MED ORDER — ACETAMINOPHEN 500 MG PO TABS
1000.0000 mg | ORAL_TABLET | Freq: Four times a day (QID) | ORAL | Status: AC
  Administered 2016-05-17 – 2016-05-18 (×3): 1000 mg via ORAL
  Filled 2016-05-17 (×3): qty 2

## 2016-05-17 MED ORDER — POTASSIUM CHLORIDE IN NACL 20-0.9 MEQ/L-% IV SOLN
INTRAVENOUS | Status: DC
  Administered 2016-05-17 – 2016-05-18 (×2): via INTRAVENOUS
  Filled 2016-05-17 (×3): qty 1000

## 2016-05-17 MED ORDER — ACETAMINOPHEN 325 MG PO TABS: 650.0000 mg | ORAL_TABLET | Freq: Four times a day (QID) | ORAL | Status: DC | PRN

## 2016-05-17 MED ORDER — FLEET ENEMA 7-19 GM/118ML RE ENEM: 1.0000 | ENEMA | Freq: Once | RECTAL | Status: DC | PRN

## 2016-05-17 MED ORDER — BUPIVACAINE-EPINEPHRINE (PF) 0.5% -1:200000 IJ SOLN
INTRAMUSCULAR | Status: AC
  Filled 2016-05-17: qty 30

## 2016-05-17 MED ORDER — OXYCODONE HCL 5 MG PO TABS
5.0000 mg | ORAL_TABLET | ORAL | Status: DC | PRN
  Administered 2016-05-18 – 2016-05-20 (×4): 5 mg via ORAL
  Filled 2016-05-17 (×4): qty 1

## 2016-05-17 MED ORDER — CEFAZOLIN SODIUM-DEXTROSE 2-4 GM/100ML-% IV SOLN
2.0000 g | Freq: Four times a day (QID) | INTRAVENOUS | Status: AC
  Administered 2016-05-17 (×3): 2 g via INTRAVENOUS
  Filled 2016-05-17 (×3): qty 100

## 2016-05-17 MED ORDER — FENTANYL CITRATE (PF) 100 MCG/2ML IJ SOLN
INTRAMUSCULAR | Status: DC | PRN
  Administered 2016-05-17: 25 ug via INTRAVENOUS

## 2016-05-17 MED ORDER — ACETAMINOPHEN 650 MG RE SUPP: 650.0000 mg | Freq: Four times a day (QID) | RECTAL | Status: DC | PRN

## 2016-05-17 MED ORDER — APIXABAN 2.5 MG PO TABS
2.5000 mg | ORAL_TABLET | Freq: Two times a day (BID) | ORAL | Status: DC
  Administered 2016-05-17 – 2016-05-20 (×7): 2.5 mg via ORAL
  Filled 2016-05-17 (×7): qty 1

## 2016-05-17 SURGICAL SUPPLY — 40 items
BIT DRILL 4.3MMS DISTAL GRDTED (BIT) ×1 IMPLANT
BNDG COHESIVE 4X5 TAN STRL (GAUZE/BANDAGES/DRESSINGS) ×3 IMPLANT
BNDG COHESIVE 6X5 TAN STRL LF (GAUZE/BANDAGES/DRESSINGS) ×3 IMPLANT
CANISTER SUCT 1200ML W/VALVE (MISCELLANEOUS) ×3 IMPLANT
CHLORAPREP W/TINT 26ML (MISCELLANEOUS) ×6 IMPLANT
DRAPE C-ARMOR (DRAPES) ×3 IMPLANT
DRAPE SHEET LG 3/4 BI-LAMINATE (DRAPES) ×3 IMPLANT
DRILL 4.3MMS DISTAL GRADUATED (BIT) ×3
DRSG OPSITE POSTOP 3X4 (GAUZE/BANDAGES/DRESSINGS) ×9 IMPLANT
DRSG OPSITE POSTOP 4X6 (GAUZE/BANDAGES/DRESSINGS) ×3 IMPLANT
ELECT CAUTERY BLADE 6.4 (BLADE) ×3 IMPLANT
ELECT REM PT RETURN 9FT ADLT (ELECTROSURGICAL) ×3
ELECTRODE REM PT RTRN 9FT ADLT (ELECTROSURGICAL) ×1 IMPLANT
GAUZE SPONGE 4X4 12PLY STRL (GAUZE/BANDAGES/DRESSINGS) ×3 IMPLANT
GLOVE BIO SURGEON STRL SZ8 (GLOVE) ×6 IMPLANT
GLOVE INDICATOR 8.0 STRL GRN (GLOVE) ×3 IMPLANT
GOWN STRL REUS W/ TWL LRG LVL3 (GOWN DISPOSABLE) ×1 IMPLANT
GOWN STRL REUS W/ TWL XL LVL3 (GOWN DISPOSABLE) ×1 IMPLANT
GOWN STRL REUS W/TWL LRG LVL3 (GOWN DISPOSABLE) ×2
GOWN STRL REUS W/TWL XL LVL3 (GOWN DISPOSABLE) ×2
GUIDEPIN VERSANAIL DSP 3.2X444 ×3 IMPLANT
GUIDEWIRE BALL NOSE 100CM (WIRE) ×3 IMPLANT
HIP FRAC NAIL LEFT 11X360MM (Orthopedic Implant) ×3 IMPLANT
MAT BLUE FLOOR 46X72 FLO (MISCELLANEOUS) ×3 IMPLANT
NAIL HIP FRAC LEFT 11X360MM (Orthopedic Implant) ×1 IMPLANT
NEEDLE FILTER BLUNT 18X 1/2SAF (NEEDLE) ×2
NEEDLE FILTER BLUNT 18X1 1/2 (NEEDLE) ×1 IMPLANT
NEEDLE HYPO 22GX1.5 SAFETY (NEEDLE) ×3 IMPLANT
NS IRRIG 500ML POUR BTL (IV SOLUTION) ×3 IMPLANT
PACK HIP COMPR (MISCELLANEOUS) ×3 IMPLANT
SCREW BONE CORTICAL 5.0X40 (Screw) ×3 IMPLANT
SCREW LAG HIP NAIL 10.5X95 (Screw) ×3 IMPLANT
SCREWDRIVER HEX TIP 3.5MM (MISCELLANEOUS) ×6 IMPLANT
STAPLER SKIN PROX 35W (STAPLE) ×3 IMPLANT
STRAP SAFETY BODY (MISCELLANEOUS) ×3 IMPLANT
SUT VIC AB 1 CT1 36 (SUTURE) ×3 IMPLANT
SUT VIC AB 2-0 CT1 (SUTURE) ×6 IMPLANT
SYR 30ML LL (SYRINGE) ×3 IMPLANT
SYRINGE 10CC LL (SYRINGE) ×3 IMPLANT
TAPE MICROFOAM 4IN (TAPE) ×3 IMPLANT

## 2016-05-17 NOTE — Op Note (Signed)
05/16/2016 - 05/17/2016  11:56 AM  Patient:   Idolina PrimerMavis W Colavito  Pre-Op Diagnosis:   Closed displaced three-part left intertrochanteric hip fracture.  Post-Op Diagnosis:   Same.  Procedure:   Reduction and internal fixation of three-part intertrochanteric left hip fracture with Biomet Affixis TFN nail.  Surgeon:   Maryagnes AmosJ. Jeffrey Melitza Metheny, MD  Assistant:   None  Anesthesia:   General LMA  Findings:   As above  Complications:   None  EBL:   100 cc  Fluids:   800 cc crystalloid  UOP:   200 cc  TT:   None  Drains:   None  Closure:   Staples  Implants:   Biomet Affixis 11 x 360 mm TFN with a 95 mm lag screw and a 40 mm distal interlocking screw  Brief Clinical Note:   The patient is a 82 year old female who sustained the above-noted injury yesterday morning when she apparently fell in her home. She was brought to the emergency room where x-rays demonstrated the above-noted injury. The patient has been cleared medically and presents at this time for reduction and internal fixation of the three-part intertrochanteric left hip fracture.  Procedure:   The patient was brought into the operating room. After adequate general laryngeal mask anesthesia was obtained, the patient was lain in the supine position on the fracture table. The uninjured leg was placed in a flexed and abducted position while the injured lower extremity was placed in longitudinal traction. The fracture was reduced using longitudinal traction and internal rotation. The adequacy of reduction was verified fluoroscopically in AP and lateral projections and found to be near anatomic. The lateral aspects of the left hip and thigh were prepped with ChloraPrep solution before being draped sterilely. Preoperative antibiotics were administered. A timeout was performed to verify the appropriate surgical site. The greater trochanter was identified fluoroscopically and an approximately 3 cm incision made about 2-3 fingerbreadths above the  tip of the greater trochanter. The incision was carried down through the subcutaneous tissues to expose the gluteal fascia. This was split the length of the incision, providing access to the tip of the trochanter. Under fluoroscopic guidance, a guidewire was drilled through the tip of the trochanter into the proximal metaphysis to the level of the lesser trochanter. After verifying its position fluoroscopically in AP and lateral projections, it was overreamed with the initial reamer to the depth of the lesser trochanter. A guidewire was passed down through the femoral canal to the supracondylar region. The adequacy of guidewire position was verified fluoroscopically in AP and lateral projections before the length of the guidewire within the canal was measured and found to be 375 mm. It was overreamed sequentially using the flexible reamers, beginning with a 10 mm reamer and progressing to a 12.5 mm reamer. This provided good cortical chatter. The 11 x 360 mm Biomet Affyxis TFN rod was selected and advanced to the appropriate depth, as verified fluoroscopically. The guide system for the lag screw was positioned and advanced through an approximately 2 cm stab incision over the lateral aspect of the proximal femur. The guidewire was drilled up through the trochanteric femoral nail and into the femoral neck to rest within 5 mm of subchondral bone. After verifying its position in the femoral neck and head in both AP and lateral projections, the guidewire was measured and found to be optimally replicated by a 95 mm lag screw. The guidewire was overreamed to the appropriate depth before the lag screw was inserted and  advanced to the appropriate depth as verified fluoroscopically in AP and lateral projections. The locking screw was advanced, then backed off a quarter turn to set the lag screw. Again the adequacy of hardware position and fracture reduction was verified fluoroscopically in AP and lateral projections and found  to be excellent.  Attention was directed distally. Using the "perfect circle" technique, the leg and fluoroscopy machine were positioned appropriately. An approximate 1.5 cm stab incision was made over the skin at the appropriate point before the drill bit was advanced through the cortex and across the static hole of the nail. The appropriate length of the screw was determined before the 40 mm distal interlocking screw was positioned, then advanced and tightened securely. Again the adequacy of screw position was verified fluoroscopically in AP and lateral projections and found to be excellent.  The wounds were irrigated thoroughly with sterile saline solution before the deeper subcutaneous tissues were closed using 2-0 Vicryl interrupted sutures. The skin was closed using staples. A total of 30 cc of 0.5% Sensorcaine with epinephrine was injected in and around all incisions. Sterile occlusive dressings were applied to all wounds before the patient was awakened, extubated, and transferred back to her hospital bed. The patient was then transferred to the recovery room in satisfactory condition after tolerating the procedure well.

## 2016-05-17 NOTE — Progress Notes (Signed)
[facility] - [place] at [facility]   PATIENT NAME: Heidi Miller    MR#:  [id number]  DATE OF BIRTH:  1922-07-15  SUBJECTIVE:  CHIEF COMPLAINT:   Chief Complaint  Patient presents with  . Hip Pain  . Fall  The patient is 82 year old Caucasian female with past medical history significant for history of A. fib, stroke, right-sided facial weakness, also, dementia, depression, hypertension, ischemic cardiomyopathy, ejection fraction of 20%, hypothyroidism, who presents to the hospital after fall and was noted to have left hip intertrochanteric fracture. She is somnolent now and not able to review of systems  Review of Systems  Unable to perform ROS: Mental acuity    VITAL SIGNS: Blood pressure (!) 151/84, pulse 93, temperature 98.4 F (36.9 C), resp. rate 17, height 5\' 3"  (1.6 m), weight 64.9 kg (143 lb), SpO2 100 %.  PHYSICAL EXAMINATION:   GENERAL:  82 y.o.-year-old patient lying in the bed with no acute distress, sleepy, but arousable, able to answer a few questions appropriately.  EYES: Pupils equal, round, reactive to light and accommodation. No scleral icterus. Extraocular muscles intact.  HEENT: Head atraumatic, normocephalic. Oropharynx and nasopharynx clear.  NECK:  Supple, no jugular venous distention. No thyroid enlargement, no tenderness.  LUNGS: Normal breath sounds bilaterally, no wheezing, rales,rhonchi or crepitation. No use of accessory muscles of respiration.  CARDIOVASCULAR: S1, S2 normal. No murmurs, rubs, or gallops.  ABDOMEN: Soft, nontender, nondistended. Bowel sounds present. No organomegaly or mass.  EXTREMITIES: No pedal edema, cyanosis, or clubbing. Left lower extremity is flexed in hip and knee, no obvious shortening or rotation  NEUROLOGIC: Cranial nerves II through XII are grossly intact, but patient is somnolent and not able to obtain full exam. Muscle strengthunable to assess. Sensation unable to assess . Gait not checked.   PSYCHIATRIC: The patient is very somnolent, able to open eyes, converses briefly yes or no, however, not able to assess orientation.  SKIN: No obvious rash, lesion, or ulcer.   ORDERS/RESULTS REVIEWED:   CBC  Recent Labs Lab 05/16/16 1039 05/17/16 0401  WBC 12.0* 11.7*  HGB 10.3* 8.1*  HCT 33.3* 25.8*  PLT 255 188  MCV 81.3 80.1  MCH 25.1* 25.3*  MCHC 30.9* 31.5*  RDW 17.4* 17.5*   ------------------------------------------------------------------------------------------------------------------  Chemistries   Recent Labs Lab 05/16/16 1039 05/17/16 0401  NA 141 142  K 4.4 4.7  CL 105 107  CO2 24 29  GLUCOSE 209* 170*  BUN 27* 33*  CREATININE 1.26* 1.58*  CALCIUM 9.8 8.8*   ------------------------------------------------------------------------------------------------------------------ estimated creatinine clearance is 21 mL/min (by C-G formula based on SCr of 1.58 mg/dL (H)). ------------------------------------------------------------------------------------------------------------------ No results for input(s): TSH, T4TOTAL, T3FREE, THYROIDAB in the last 72 hours.  Invalid input(s): FREET3  Cardiac Enzymes No results for input(s): CKMB, TROPONINI, MYOGLOBIN in the last 168 hours.  Invalid input(s): CK ------------------------------------------------------------------------------------------------------------------ Invalid input(s): POCBNP ---------------------------------------------------------------------------------------------------------------  RADIOLOGY: Dg Chest 1 View  Result Date: 05/16/2016 CLINICAL DATA:  Larey Seat today with left hip pain, dementia EXAM: CHEST 1 VIEW COMPARISON:  Chest x-ray of 03/15/2016 FINDINGS: No active infiltrate or effusion is seen. Mild cardiomegaly is stable. Mediastinal and hilar contours are unremarkable. There is thoracic aortic atherosclerosis present. No acute bony abnormality is seen. IMPRESSION: 1. Stable cardiomegaly.  2. No active lung disease. 3. Thoracic aortic atherosclerosis. Electronically Signed   By: Dwyane Dee M.D.   On: 05/16/2016 11:12   Ct Head Wo Contrast  Result Date: 05/16/2016 CLINICAL DATA:  Pain following fall EXAM:  CT HEAD WITHOUT CONTRAST CT CERVICAL SPINE WITHOUT CONTRAST TECHNIQUE: Multidetector CT imaging of the head and cervical spine was performed following the standard protocol without intravenous contrast. Multiplanar CT image reconstructions of the cervical spine were also generated. COMPARISON:  Head CT March 14, 2016 FINDINGS: CT HEAD FINDINGS Brain: Mild to moderate diffuse atrophy remains. There is no intracranial mass no hemorrhage, extra-axial fluid collection, or midline shift. There is patchy small vessel disease in the centra semiovale bilaterally. There is evidence of a prior infarct in the posterior limb of the right internal capsule, stable. There is evidence of a prior infarct in the medial right occipital lobe, stable. There is evidence of a prior small infarct in the posterior mid left cerebellum posterior to the dentate nucleus on the left. There is also evidence of a prior small infarct in the posterior mid right cerebellum. There is no new gray-white compartment lesion. No acute infarct evident. Vascular: There is no hyperdense vessel. There is calcification in the carotid siphon regions bilaterally. Skull: The patient has had a previous craniotomy involving the inferolateral left occipital bone, slightly posterior to the mastoids, stable. Bony calvarium otherwise appears intact and stable. Sinuses/Orbits: There is mild mucosal thickening in several ethmoid air cells bilaterally. Visualized paranasal sinuses elsewhere clear. No intraorbital lesions are evident. Other: Mastoid air cells are clear. CT CERVICAL SPINE FINDINGS Alignment: There is no spondylolisthesis. Skull base and vertebrae: Skull base and craniocervical junction regions appear normal. There is no evidence of  fracture. No blastic or lytic bone lesions are evident. Soft tissues and spinal canal: Prevertebral soft tissues and predental space regions are normal. There are no paraspinous lesions. There is no spinal stenosis. Disc levels: There is moderate disc space narrowing at C3-4. There is mild disc space narrowing at C4-5 and C5-6. There is facet hypertrophy at multiple levels. There is mild exit foraminal narrowing on the left at C2-3. There is moderately severe exit foraminal narrowing on the right at C3-4 with impression on the exiting nerve root. There is moderate exit foraminal narrowing at C4-5 bilaterally. There is moderate exit foraminal narrowing on the right at C5-6. No disc extrusion is evident. Upper chest: There is mild scarring in the lung apices. There is extensive atherosclerotic calcification in the aorta. Other: There is calcification in each carotid artery. There is calcification in the right subclavian artery. IMPRESSION: CT head: Atrophy with small vessel disease and prior infarcts, stable. No acute infarct. No hemorrhage, mass, or extra-axial fluid collection. Evidence of previous inferolateral left occipital craniotomy. Bony structures otherwise appear unremarkable. Mild ethmoid sinus disease bilaterally. Foci of vascular calcification noted. CT cervical spine: No acute fracture or spondylolisthesis. Multilevel arthropathy. Aortic atherosclerosis as well as bilateral carotid artery and right subclavian artery calcification noted. Electronically Signed   By: Bretta BangWilliam  Woodruff III M.D.   On: 05/16/2016 13:09   Ct Cervical Spine Wo Contrast  Result Date: 05/16/2016 CLINICAL DATA:  Pain following fall EXAM: CT HEAD WITHOUT CONTRAST CT CERVICAL SPINE WITHOUT CONTRAST TECHNIQUE: Multidetector CT imaging of the head and cervical spine was performed following the standard protocol without intravenous contrast. Multiplanar CT image reconstructions of the cervical spine were also generated. COMPARISON:   Head CT March 14, 2016 FINDINGS: CT HEAD FINDINGS Brain: Mild to moderate diffuse atrophy remains. There is no intracranial mass no hemorrhage, extra-axial fluid collection, or midline shift. There is patchy small vessel disease in the centra semiovale bilaterally. There is evidence of a prior infarct in the posterior limb  of the right internal capsule, stable. There is evidence of a prior infarct in the medial right occipital lobe, stable. There is evidence of a prior small infarct in the posterior mid left cerebellum posterior to the dentate nucleus on the left. There is also evidence of a prior small infarct in the posterior mid right cerebellum. There is no new gray-white compartment lesion. No acute infarct evident. Vascular: There is no hyperdense vessel. There is calcification in the carotid siphon regions bilaterally. Skull: The patient has had a previous craniotomy involving the inferolateral left occipital bone, slightly posterior to the mastoids, stable. Bony calvarium otherwise appears intact and stable. Sinuses/Orbits: There is mild mucosal thickening in several ethmoid air cells bilaterally. Visualized paranasal sinuses elsewhere clear. No intraorbital lesions are evident. Other: Mastoid air cells are clear. CT CERVICAL SPINE FINDINGS Alignment: There is no spondylolisthesis. Skull base and vertebrae: Skull base and craniocervical junction regions appear normal. There is no evidence of fracture. No blastic or lytic bone lesions are evident. Soft tissues and spinal canal: Prevertebral soft tissues and predental space regions are normal. There are no paraspinous lesions. There is no spinal stenosis. Disc levels: There is moderate disc space narrowing at C3-4. There is mild disc space narrowing at C4-5 and C5-6. There is facet hypertrophy at multiple levels. There is mild exit foraminal narrowing on the left at C2-3. There is moderately severe exit foraminal narrowing on the right at C3-4 with  impression on the exiting nerve root. There is moderate exit foraminal narrowing at C4-5 bilaterally. There is moderate exit foraminal narrowing on the right at C5-6. No disc extrusion is evident. Upper chest: There is mild scarring in the lung apices. There is extensive atherosclerotic calcification in the aorta. Other: There is calcification in each carotid artery. There is calcification in the right subclavian artery. IMPRESSION: CT head: Atrophy with small vessel disease and prior infarcts, stable. No acute infarct. No hemorrhage, mass, or extra-axial fluid collection. Evidence of previous inferolateral left occipital craniotomy. Bony structures otherwise appear unremarkable. Mild ethmoid sinus disease bilaterally. Foci of vascular calcification noted. CT cervical spine: No acute fracture or spondylolisthesis. Multilevel arthropathy. Aortic atherosclerosis as well as bilateral carotid artery and right subclavian artery calcification noted. Electronically Signed   By: Bretta Bang III M.D.   On: 05/16/2016 13:09   Dg Hip Operative Unilat W Or W/o Pelvis Left  Result Date: 05/17/2016 CLINICAL DATA:  ORIF left proximal femur fracture EXAM: OPERATIVE LEFT HIP (WITH PELVIS IF PERFORMED) 4 VIEWS TECHNIQUE: Fluoroscopic spot image(s) were submitted for interpretation post-operatively. COMPARISON:  05/16/2016 left hip radiographs. FINDINGS: Fluoroscopy time 0 minutes 46 seconds. 4 nondiagnostic spot fluoroscopic intraoperative left femur radiographs demonstrate transfixation of comminuted intertrochanteric left proximal femur fracture by intra medullary rod and interlocking left femoral neck pin and distal interlocking screw. Retracted left lesser trochanter fracture fragment. IMPRESSION: Intraoperative fluoroscopic guidance for ORIF intertrochanteric left femur fracture. Electronically Signed   By: Delbert Phenix M.D.   On: 05/17/2016 11:55   Dg Hip Unilat With Pelvis 2-3 Views Left  Result Date:  05/16/2016 CLINICAL DATA:  Larey Seat today with pain, history of dementia EXAM: DG HIP (WITH OR WITHOUT PELVIS) 2-3V LEFT COMPARISON:  None. FINDINGS: There is a comminuted acute left intertrochanteric femoral fracture. There appears to be partial avulsion of the greater and lesser trochanters. The left hip joint space is unremarkable. The pelvic rami appear intact. The right hip joint space is unremarkable. The SI joints are corticated. IMPRESSION: 1. Acute comminuted  intertrochanteric left femoral fracture. Avulsion of the greater and lesser trochanters. 2. No other acute abnormality. Electronically Signed   By: Dwyane DeePaul  Barry M.D.   On: 05/16/2016 11:11    EKG:  Orders placed or performed during the hospital encounter of 03/14/16  . ED EKG  . ED EKG  . EKG 12-Lead  . EKG 12-Lead    ASSESSMENT AND PLAN:  Active Problems:   Hip fracture (HCC) #1. Left intertrochanteric hip fracture, patient has moderate perioperative risks for cardiovascular event, such as age, functional status, and cardiomyopathy, atrial fibrillation. This was discussed with patient's daughter and surgeon, Dr. Joice LoftsPoggi, all questions were answered #2. Acute renal insufficiency, continue IV fluids at lower rate, following creatinine, get urinalysis to rule out UTI #3. Hyperglycemia, get hemoglobin A1c to rule out diabetes #4. Hypoxia, possibly related to pain medications, incentive spirometry if the patient is able to comply, continue oxygen therapy as needed #5. Acute post hemorrhagic anemia, follow after operation, transfuse patient as needed   Management plans discussed with the patient, family and they are in agreement.   DRUG ALLERGIES: No Known Allergies  CODE STATUS:     Code Status Orders        Start     Ordered   05/16/16 1439  Do not attempt resuscitation (DNR)  Continuous    Question Answer Comment  In the event of cardiac or respiratory ARREST Do not call a "code blue"   In the event of cardiac or  respiratory ARREST Do not perform Intubation, CPR, defibrillation or ACLS   In the event of cardiac or respiratory ARREST Use medication by any route, position, wound care, and other measures to relive pain and suffering. May use oxygen, suction and manual treatment of airway obstruction as needed for comfort.      05/16/16 1438    Code Status History    Date Active Date Inactive Code Status Order ID Comments User Context   03/16/2016 10:36 AM 03/17/2016  5:14 PM DNR [id number]  Auburn BilberryShreyang Patel, MD Inpatient   03/15/2016  1:08 AM 03/15/2016 10:36 AM Full Code [id number]  Oralia Manisavid Willis, MD Inpatient    Advance Directive Documentation   Flowsheet Row Most Recent Value  Type of Advance Directive  Healthcare Power of Attorney  Pre-existing out of facility DNR order (yellow form or pink MOST form)  No data  "MOST" Form in Place?  No data      TOTAL TIME TAKING CARE OF THIS PATIENT: 40 minutes.    Katharina CaperVAICKUTE,Mccormick Macon M.D on 05/17/2016 at 12:48 PM  Between 7am to 6pm - Pager - [phone]  After 6pm go to [email or web address] - password [email or web address] [facility]  [facility]  Office  [phone]  CC: Primary care physician; BABAOFF, Lavada MesiMARC E, MD

## 2016-05-17 NOTE — Anesthesia Postprocedure Evaluation (Signed)
Anesthesia Post Note  Patient: Idolina PrimerMavis W Matsunaga  Procedure(s) Performed: Procedure(s) (LRB): INTRAMEDULLARY (IM) NAIL INTERTROCHANTRIC (Left)  Patient location during evaluation: PACU Anesthesia Type: General Level of consciousness: awake and alert Pain management: pain level controlled Vital Signs Assessment: post-procedure vital signs reviewed and stable Respiratory status: spontaneous breathing, nonlabored ventilation and respiratory function stable Cardiovascular status: blood pressure returned to baseline and stable Postop Assessment: no signs of nausea or vomiting Anesthetic complications: no    Last Vitals:  Vitals:   05/17/16 1158 05/17/16 1200  BP: (!) 151/84   Pulse:    Resp:    Temp: 37 C 36.2 C    Last Pain:  Vitals:   05/17/16 1200  TempSrc:   PainSc: Asleep                 [place] Zirkelbach

## 2016-05-17 NOTE — Anesthesia Preprocedure Evaluation (Addendum)
Anesthesia Evaluation  Patient identified by MRN, date of birth, ID band Patient awake    Reviewed: Allergy & Precautions, NPO status , Patient's Chart, lab work & pertinent test results, reviewed documented beta blocker date and time   History of Anesthesia Complications Negative for: history of anesthetic complications  Airway Mallampati: II  TM Distance: >3 FB Neck ROM: Full    Dental  (+) Edentulous Upper, Edentulous Lower   Pulmonary neg pulmonary ROS, neg sleep apnea, neg COPD,    breath sounds clear to auscultation- rhonchi (-) wheezing      Cardiovascular hypertension, Pt. on medications and Pt. on home beta blockers + CAD, + Cardiac Stents (LAD in 1998) and +CHF (ischemic cardiomyopathy)   Rhythm:Irregular Rate:Normal - Systolic murmurs and - Diastolic murmurs Echo 03/15/16: - Left ventricle: The cavity size was severely dilated. Systolic   function was severely reduced. The estimated ejection fraction   was 20%. Diffuse hypokinesis. - Aortic valve: There was mild regurgitation. - Mitral valve: There was mild regurgitation. - Left atrium: The atrium was mildly dilated. - Right ventricle: The cavity size was moderately dilated. - Tricuspid valve: There was moderate regurgitation. - Pericardium, extracardiac: A trivial pericardial effusion was   identified.   Neuro/Psych Depression Alzheimer's dementia  CVA, Residual Symptoms    GI/Hepatic Neg liver ROS, GERD  ,  Endo/Other  neg diabetesHypothyroidism   Renal/GU CRFRenal disease     Musculoskeletal   Abdominal (+) - obese,   Peds  Hematology negative hematology ROS (+)   Anesthesia Other Findings Past Medical History: No date: Atrial fibrillation (HCC)     Comment: on eliquis No date: CAD (coronary artery disease)     Comment: s/p LAD stent No date: CHF (congestive heart failure) (HCC)     Comment: systolic dysfunction, EF 20% No date: CVA (cerebral  vascular accident) (HCC)     Comment: in Sept 2017 with left internal capsule               infarct No date: Dementia No date: Depression No date: GERD (gastroesophageal reflux disease) No date: Hypertension No date: Ischemic cardiomyopathy No date: Thyroid disease   Reproductive/Obstetrics                            Anesthesia Physical Anesthesia Plan  ASA: IV and emergent  Anesthesia Plan: General   Post-op Pain Management:    Induction: Intravenous  Airway Management Planned: Oral ETT  Additional Equipment:   Intra-op Plan:   Post-operative Plan: Extubation in OR  Informed Consent: I have reviewed the patients History and Physical, chart, labs and discussed the procedure including the risks, benefits and alternatives for the proposed anesthesia with the patient or authorized representative who has indicated his/her understanding and acceptance.   Dental advisory given  Plan Discussed with: CRNA and Anesthesiologist  Anesthesia Plan Comments: (Discussed with patient and family plan to suspend DNR order for perioperative period, given that intubation and some degree of resucsitation will be necessary for surgery.)        Lab Results  Component Value Date   WBC 11.7 (H) 05/17/2016   HGB 8.1 (L) 05/17/2016   HCT 25.8 (L) 05/17/2016   MCV 80.1 05/17/2016   PLT 188 05/17/2016    Anesthesia Quick Evaluation

## 2016-05-17 NOTE — Transfer of Care (Signed)
Immediate Anesthesia Transfer of Care Note  Patient: Heidi Miller  Procedure(s) Performed: Procedure(s): INTRAMEDULLARY (IM) NAIL INTERTROCHANTRIC (Left)  Patient Location: PACU  Anesthesia Type:General  Level of Consciousness: awake and alert   Airway & Oxygen Therapy: Patient Spontanous Breathing and Patient connected to nasal cannula oxygen  Post-op Assessment: Report given to RN  Post vital signs: Reviewed and stable  Last Vitals:  Vitals:   05/17/16 0800 05/17/16 1158  BP: 100/74 (!) 151/84  Pulse:    Resp: 17   Temp: 37 C 37 C    Last Pain:  Vitals:   05/17/16 0800  TempSrc: Oral  PainSc:          Complications: No apparent anesthesia complications

## 2016-05-17 NOTE — Progress Notes (Signed)
Pts BP 100/74, MD Winona LegatoVaickute notified, per MD do not give.

## 2016-05-18 ENCOUNTER — Inpatient Hospital Stay: Payer: PPO

## 2016-05-18 LAB — BASIC METABOLIC PANEL
ANION GAP: 5 (ref 5–15)
BUN: 36 mg/dL — AB (ref 6–20)
CALCIUM: 8.4 mg/dL — AB (ref 8.9–10.3)
CO2: 29 mmol/L (ref 22–32)
Chloride: 107 mmol/L (ref 101–111)
Creatinine, Ser: 1.41 mg/dL — ABNORMAL HIGH (ref 0.44–1.00)
GFR calc Af Amer: 37 mL/min — ABNORMAL LOW (ref >60)
GFR calc non Af Amer: 32 mL/min — ABNORMAL LOW (ref >60)
GLUCOSE: 128 mg/dL — AB (ref 65–99)
Potassium: 4.3 mmol/L (ref 3.5–5.1)
Sodium: 141 mmol/L (ref 135–145)

## 2016-05-18 LAB — CBC
HCT: 24.7 % — ABNORMAL LOW (ref 35.0–47.0)
HEMOGLOBIN: 7.5 g/dL — AB (ref 12.0–16.0)
MCH: 24.6 pg — AB (ref 26.0–34.0)
MCHC: 30.3 g/dL — AB (ref 32.0–36.0)
MCV: 81.1 fL (ref 80.0–100.0)
Platelets: 184 10*3/uL (ref 150–440)
RBC: 3.05 MIL/uL — ABNORMAL LOW (ref 3.80–5.20)
RDW: 18 % — ABNORMAL HIGH (ref 11.5–14.5)
WBC: 11.3 10*3/uL — ABNORMAL HIGH (ref 3.6–11.0)

## 2016-05-18 LAB — HEMOGLOBIN AND HEMATOCRIT, BLOOD
HCT: 31.3 % — ABNORMAL LOW (ref 35.0–47.0)
Hemoglobin: 9.8 g/dL — ABNORMAL LOW (ref 12.0–16.0)

## 2016-05-18 LAB — PREPARE RBC (CROSSMATCH)

## 2016-05-18 LAB — ABO/RH: ABO/RH(D): AB POS

## 2016-05-18 LAB — HEMOGLOBIN A1C
HEMOGLOBIN A1C: 6.8 % — AB (ref 4.8–5.6)
MEAN PLASMA GLUCOSE: 148 mg/dL

## 2016-05-18 MED ORDER — SODIUM CHLORIDE 0.9 % IV SOLN
Freq: Once | INTRAVENOUS | Status: AC
  Administered 2016-05-18: 10:00:00 via INTRAVENOUS

## 2016-05-18 NOTE — Clinical Social Work Note (Signed)
Clinical Social Work Assessment  Patient Details  Name: Heidi Miller MRN: [id number] Date of Birth: 02/06/1923  Date of referral:  05/18/16               Reason for consult:  Facility Placement                Permission sought to share information with:  Chartered certified accountant granted to share information::  Yes, Verbal Permission Granted  Name::        Agency::     Relationship::     Contact Information:     Housing/Transportation Living arrangements for the past 2 months:  Single Family Home Source of Information:  Patient Patient Interpreter Needed:  None Criminal Activity/Legal Involvement Pertinent to Current Situation/Hospitalization:  No - Comment as needed Significant Relationships:  Adult Children, Community Support, [place] Lives with:  Self Do you feel safe going back to the place where you live?  Yes Need for family participation in patient care:  No (Coment)  Care giving concerns:  STR   Social Worker assessment / plan: CSW met with the patient and her family at bedside to discuss dc planning. The patient gave verbal permission to begin the STR referral process. The patient indicated that her preference is for [facility] as she has been in the past and had a good experience.   At baseline, the patient lives alone and is able to cook, groom, clean, manage medications and manage her finances. The patient uses a walker to ambulate, and she is continent of bladder and bowel. PT recommends STR. Patient is currently on o2 and the family indicates that she has o2 at home but has never used it.  Employment status:  Retired Nurse, adult PT Recommendations:  [place] / Referral to community resources:  [place]  Patient/Family's Response to care:  Patient's family and patient thanked CSW for involvement.  Patient/Family's Understanding of and Emotional Response to Diagnosis, Current  Treatment, and Prognosis:  Patient and family in agreement with dc plan.  Emotional Assessment Appearance:  Appears stated age Attitude/Demeanor/Rapport:   (Pleasant) Affect (typically observed):  Appropriate, Pleasant Orientation:  Oriented to Self, Oriented to Place, Oriented to  Time, Oriented to Situation Alcohol / Substance use:  Never Used Psych involvement (Current and /or in the community):  No (Comment)  Discharge Needs  Concerns to be addressed:  Discharge Planning Concerns Readmission within the last 30 days:  No Current discharge risk:  None Barriers to Discharge:  Continued Medical Work up   [employer], LCSW 05/18/2016, 3:10 PM

## 2016-05-18 NOTE — Progress Notes (Signed)
[facility] -  at [facility]   PATIENT NAME: Heidi Miller    MR#:  [id number]  DATE OF BIRTH:  12/20/1922  SUBJECTIVE:  CHIEF COMPLAINT:   Chief Complaint  Patient presents with  . Hip Pain  . Fall  The patient is 82 year old Caucasian female with past medical history significant for history of A. fib, stroke, right-sided facial weakness, also, dementia, depression, hypertension, ischemic cardiomyopathy, ejection fraction of 20%, hypothyroidism, who presents to the hospital after fall and was noted to have left hip intertrochanteric fracture.  The patient underwent  intramedullay nail placement  05/17/2016 by Dr Joice Lofts.  The patient feels good today. Denies any significant pain in the operative site. She was noted to be anemic, hemoglobin level of 7.5, tachycardic with heart rate up to 140, atrial fibrillation, hypertensive. Patient is going to be transfused with 1 unit of packed red blood cells. She remains hypoxic, on 4  liters of oxygen through nasal cannula.    Review of Systems  Constitutional: Negative for chills, fever and weight loss.  HENT: Negative for congestion.   Eyes: Negative for blurred vision and double vision.  Respiratory: Negative for cough, sputum production, shortness of breath and wheezing.   Cardiovascular: Negative for chest pain, palpitations, orthopnea, leg swelling and PND.  Gastrointestinal: Negative for abdominal pain, blood in stool, constipation, diarrhea, nausea and vomiting.  Genitourinary: Negative for dysuria, frequency, hematuria and urgency.  Musculoskeletal: Positive for joint pain. Negative for falls.  Neurological: Negative for dizziness, tremors, focal weakness and headaches.  Endo/Heme/Allergies: Does not bruise/bleed easily.  Psychiatric/Behavioral: Negative for depression. The patient does not have insomnia.     VITAL SIGNS: Blood pressure 97/71, pulse (!) 107, temperature 99 F (37.2 C), temperature source  Oral, resp. rate 18, height 5\' 3"  (1.6 m), weight 64.9 kg (143 lb), SpO2 100 %.  PHYSICAL EXAMINATION:   GENERAL:  82 y.o.-year-old patient sitting in bed, ready to start her breakfast, comfortable  EYES: Pupils equal, round, reactive to light and accommodation. No scleral icterus. Extraocular muscles intact.  HEENT: Head atraumatic, normocephalic. Oropharynx and nasopharynx clear.  NECK:  Supple, no jugular venous distention. No thyroid enlargement, no tenderness.  LUNGS: Normal breath sounds bilaterally, no wheezing, rales,rhonchi or crepitation. No use of accessory muscles of respiration.  CARDIOVASCULAR: S1, S2 normal. No murmurs, rubs, or gallops.  ABDOMEN: Soft, nontender, nondistended. Bowel sounds present. No organomegaly or mass.  EXTREMITIES: No pedal edema, cyanosis, or clubbing. Left lower extremity has dressing on the side with some swelling of left thigh, minimal old blood, no drainage, no significant pain on palpation, no bleeding  NEUROLOGIC: Cranial nerves II through XII are grossly intact, but patient is somnolent and not able to obtain full exam. Muscle strengthunable to assess. Sensation unable to assess . Gait not checked.  PSYCHIATRIC: The patient is alert, oriented 2  SKIN: No obvious rash, lesion, or ulcer.   ORDERS/RESULTS REVIEWED:   CBC  Recent Labs Lab 05/16/16 1039 05/17/16 0401 05/18/16 0345  WBC 12.0* 11.7* 11.3*  HGB 10.3* 8.1* 7.5*  HCT 33.3* 25.8* 24.7*  PLT 255 188 184  MCV 81.3 80.1 81.1  MCH 25.1* 25.3* 24.6*  MCHC 30.9* 31.5* 30.3*  RDW 17.4* 17.5* 18.0*   ------------------------------------------------------------------------------------------------------------------  Chemistries   Recent Labs Lab 05/16/16 1039 05/17/16 0401 05/18/16 0345  NA 141 142 141  K 4.4 4.7 4.3  CL 105 107 107  CO2 24 29 29   GLUCOSE 209* 170* 128*  BUN 27*  33* 36*  CREATININE 1.26* 1.58* 1.41*  CALCIUM 9.8 8.8* 8.4*    ------------------------------------------------------------------------------------------------------------------ estimated creatinine clearance is 23.5 mL/min (by C-G formula based on SCr of 1.41 mg/dL (H)). ------------------------------------------------------------------------------------------------------------------ No results for input(s): TSH, T4TOTAL, T3FREE, THYROIDAB in the last 72 hours.  Invalid input(s): FREET3  Cardiac Enzymes No results for input(s): CKMB, TROPONINI, MYOGLOBIN in the last 168 hours.  Invalid input(s): CK ------------------------------------------------------------------------------------------------------------------ Invalid input(s): POCBNP ---------------------------------------------------------------------------------------------------------------  RADIOLOGY: Ct Head Wo Contrast  Result Date: 05/16/2016 CLINICAL DATA:  Pain following fall EXAM: CT HEAD WITHOUT CONTRAST CT CERVICAL SPINE WITHOUT CONTRAST TECHNIQUE: Multidetector CT imaging of the head and cervical spine was performed following the standard protocol without intravenous contrast. Multiplanar CT image reconstructions of the cervical spine were also generated. COMPARISON:  Head CT March 14, 2016 FINDINGS: CT HEAD FINDINGS Brain: Mild to moderate diffuse atrophy remains. There is no intracranial mass no hemorrhage, extra-axial fluid collection, or midline shift. There is patchy small vessel disease in the centra semiovale bilaterally. There is evidence of a prior infarct in the posterior limb of the right internal capsule, stable. There is evidence of a prior infarct in the medial right occipital lobe, stable. There is evidence of a prior small infarct in the posterior mid left cerebellum posterior to the dentate nucleus on the left. There is also evidence of a prior small infarct in the posterior mid right cerebellum. There is no new gray-white compartment lesion. No acute infarct evident.  Vascular: There is no hyperdense vessel. There is calcification in the carotid siphon regions bilaterally. Skull: The patient has had a previous craniotomy involving the inferolateral left occipital bone, slightly posterior to the mastoids, stable. Bony calvarium otherwise appears intact and stable. Sinuses/Orbits: There is mild mucosal thickening in several ethmoid air cells bilaterally. Visualized paranasal sinuses elsewhere clear. No intraorbital lesions are evident. Other: Mastoid air cells are clear. CT CERVICAL SPINE FINDINGS Alignment: There is no spondylolisthesis. Skull base and vertebrae: Skull base and craniocervical junction regions appear normal. There is no evidence of fracture. No blastic or lytic bone lesions are evident. Soft tissues and spinal canal: Prevertebral soft tissues and predental space regions are normal. There are no paraspinous lesions. There is no spinal stenosis. Disc levels: There is moderate disc space narrowing at C3-4. There is mild disc space narrowing at C4-5 and C5-6. There is facet hypertrophy at multiple levels. There is mild exit foraminal narrowing on the left at C2-3. There is moderately severe exit foraminal narrowing on the right at C3-4 with impression on the exiting nerve root. There is moderate exit foraminal narrowing at C4-5 bilaterally. There is moderate exit foraminal narrowing on the right at C5-6. No disc extrusion is evident. Upper chest: There is mild scarring in the lung apices. There is extensive atherosclerotic calcification in the aorta. Other: There is calcification in each carotid artery. There is calcification in the right subclavian artery. IMPRESSION: CT head: Atrophy with small vessel disease and prior infarcts, stable. No acute infarct. No hemorrhage, mass, or extra-axial fluid collection. Evidence of previous inferolateral left occipital craniotomy. Bony structures otherwise appear unremarkable. Mild ethmoid sinus disease bilaterally. Foci of  vascular calcification noted. CT cervical spine: No acute fracture or spondylolisthesis. Multilevel arthropathy. Aortic atherosclerosis as well as bilateral carotid artery and right subclavian artery calcification noted. Electronically Signed   By: Bretta Bang III M.D.   On: 05/16/2016 13:09   Ct Cervical Spine Wo Contrast  Result Date: 05/16/2016 CLINICAL DATA:  Pain following fall EXAM: CT  HEAD WITHOUT CONTRAST CT CERVICAL SPINE WITHOUT CONTRAST TECHNIQUE: Multidetector CT imaging of the head and cervical spine was performed following the standard protocol without intravenous contrast. Multiplanar CT image reconstructions of the cervical spine were also generated. COMPARISON:  Head CT March 14, 2016 FINDINGS: CT HEAD FINDINGS Brain: Mild to moderate diffuse atrophy remains. There is no intracranial mass no hemorrhage, extra-axial fluid collection, or midline shift. There is patchy small vessel disease in the centra semiovale bilaterally. There is evidence of a prior infarct in the posterior limb of the right internal capsule, stable. There is evidence of a prior infarct in the medial right occipital lobe, stable. There is evidence of a prior small infarct in the posterior mid left cerebellum posterior to the dentate nucleus on the left. There is also evidence of a prior small infarct in the posterior mid right cerebellum. There is no new gray-white compartment lesion. No acute infarct evident. Vascular: There is no hyperdense vessel. There is calcification in the carotid siphon regions bilaterally. Skull: The patient has had a previous craniotomy involving the inferolateral left occipital bone, slightly posterior to the mastoids, stable. Bony calvarium otherwise appears intact and stable. Sinuses/Orbits: There is mild mucosal thickening in several ethmoid air cells bilaterally. Visualized paranasal sinuses elsewhere clear. No intraorbital lesions are evident. Other: Mastoid air cells are clear. CT  CERVICAL SPINE FINDINGS Alignment: There is no spondylolisthesis. Skull base and vertebrae: Skull base and craniocervical junction regions appear normal. There is no evidence of fracture. No blastic or lytic bone lesions are evident. Soft tissues and spinal canal: Prevertebral soft tissues and predental space regions are normal. There are no paraspinous lesions. There is no spinal stenosis. Disc levels: There is moderate disc space narrowing at C3-4. There is mild disc space narrowing at C4-5 and C5-6. There is facet hypertrophy at multiple levels. There is mild exit foraminal narrowing on the left at C2-3. There is moderately severe exit foraminal narrowing on the right at C3-4 with impression on the exiting nerve root. There is moderate exit foraminal narrowing at C4-5 bilaterally. There is moderate exit foraminal narrowing on the right at C5-6. No disc extrusion is evident. Upper chest: There is mild scarring in the lung apices. There is extensive atherosclerotic calcification in the aorta. Other: There is calcification in each carotid artery. There is calcification in the right subclavian artery. IMPRESSION: CT head: Atrophy with small vessel disease and prior infarcts, stable. No acute infarct. No hemorrhage, mass, or extra-axial fluid collection. Evidence of previous inferolateral left occipital craniotomy. Bony structures otherwise appear unremarkable. Mild ethmoid sinus disease bilaterally. Foci of vascular calcification noted. CT cervical spine: No acute fracture or spondylolisthesis. Multilevel arthropathy. Aortic atherosclerosis as well as bilateral carotid artery and right subclavian artery calcification noted. Electronically Signed   By: Bretta Bang III M.D.   On: 05/16/2016 13:09   Dg Chest Port 1 View  Result Date: 05/18/2016 CLINICAL DATA:  Hypoxia. Status post left hip ORIF for hip fracture yesterday. EXAM: PORTABLE CHEST 1 VIEW COMPARISON:  05/16/2016 FINDINGS: The heart size is stable  and mildly enlarged. Lung volumes are low with bibasilar atelectasis present. There is no evidence of pulmonary edema, consolidation, pneumothorax, nodule or pleural fluid. The visualized skeletal structures are unremarkable. IMPRESSION: Bibasilar atelectasis. Electronically Signed   By: Irish Lack M.D.   On: 05/18/2016 10:54   Dg Hip Operative Unilat W Or W/o Pelvis Left  Result Date: 05/17/2016 CLINICAL DATA:  ORIF left proximal femur fracture EXAM: OPERATIVE LEFT  HIP (WITH PELVIS IF PERFORMED) 4 VIEWS TECHNIQUE: Fluoroscopic spot image(s) were submitted for interpretation post-operatively. COMPARISON:  05/16/2016 left hip radiographs. FINDINGS: Fluoroscopy time 0 minutes 46 seconds. 4 nondiagnostic spot fluoroscopic intraoperative left femur radiographs demonstrate transfixation of comminuted intertrochanteric left proximal femur fracture by intra medullary rod and interlocking left femoral neck pin and distal interlocking screw. Retracted left lesser trochanter fracture fragment. IMPRESSION: Intraoperative fluoroscopic guidance for ORIF intertrochanteric left femur fracture. Electronically Signed   By: Delbert PhenixJason A Poff M.D.   On: 05/17/2016 11:55    EKG:  Orders placed or performed during the hospital encounter of 03/14/16  . ED EKG  . ED EKG  . EKG 12-Lead  . EKG 12-Lead    ASSESSMENT AND PLAN:  Active Problems:   Hip fracture (HCC) #1. Comminuted Left intertrochanteric hip fracture, closed, initial encounter, status post intramedullary nail placement 11th of November 2017 by Dr. Joice LoftsPoggi, the patient had moderate perioperative risks for cardiovascular event, such as age, functional status, cardiomyopathy, atrial fibrillation. Supportive therapy, physical therapy, anticoagulation according to surgery, likely discharge to skilled nursing facility within 1-2 days  #2. Acute renal insufficiency, minimal if any improvement on IV fluids at lower rate, urinalysis revealed pyuria, concerning for  urinary tract infection, culture is taken, initiate patient on the antibiotic therapy if positive #3. Hyperglycemia, hemoglobin A1c was 6.8, prediabetes, patient may benefit from a diabetic education, diabetic diet #4. Hypoxia, was thought to be related to pain medications, repeat the chest x-ray revealed a basal atelectasis, continue incentive spirometry , wean off oxygen therapy as tolerated  #5. Acute post hemorrhagic anemia,  transfuse patient with one unit of packed red blood cells, recheck hemoglobin level after transfusion #6. Hypotension, follow his transfusion #7. A. fib, RVR, follow up after transfusion, advanced medications as needed   Managment plans discussed with the patient, family and they are in agreement.   DRUG ALLERGIES: No Known Allergies  CODE STATUS:     Code Status Orders        Start     Ordered   05/16/16 1439  Do not attempt resuscitation (DNR)  Continuous    Question Answer Comment  In the event of cardiac or respiratory ARREST Do not call a "code blue"   In the event of cardiac or respiratory ARREST Do not perform Intubation, CPR, defibrillation or ACLS   In the event of cardiac or respiratory ARREST Use medication by any route, position, wound care, and other measures to relive pain and suffering. May use oxygen, suction and manual treatment of airway obstruction as needed for comfort.      05/16/16 1438    Code Status History    Date Active Date Inactive Code Status Order ID Comments User Context   03/16/2016 10:36 AM 03/17/2016  5:14 PM DNR [id number]  Auburn BilberryShreyang Patel, MD Inpatient   03/15/2016  1:08 AM 03/15/2016 10:36 AM Full Code [id number]  Oralia Manisavid Willis, MD Inpatient    Advance Directive Documentation   Flowsheet Row Most Recent Value  Type of Advance Directive  Healthcare Power of Attorney  Pre-existing out of facility DNR order (yellow form or pink MOST form)  No data  "MOST" Form in Place?  No data      TOTAL TIME TAKING CARE OF THIS PATIENT: 40  minutes.    Katharina CaperVAICKUTE,Jeptha Hinnenkamp M.D on 05/18/2016 at 11:50 AM  Between 7am to 6pm - Pager - [phone]  After 6pm go to [email or web address] - password [email or web address]  [facility]  Office  [phone]  CC: Primary care physician; BABAOFF, Lavada MesiMARC E, MD

## 2016-05-18 NOTE — Evaluation (Signed)
Physical Therapy Evaluation Patient Details Name: Heidi PrimerMavis W Miller MRN: [id number] DOB: 06-May-1923 Today's Date: 05/18/2016   History of Present Illness  Heidi Miller  is a 82 y.o. female with a known history of Atrial fibrillation on eliquis, recent admission 2 months ago for an acute CVA with minimal right facial deficits, Alzheimer's dementia, depression, hypertension, ischemic cardiomyopathy with EF of 20%, hypothyroidism presents from home secondary to a fall and noted to have left hip intertrochanteric fracture. Patient is s/p LLE ORIF on 05/17/16; She is WBAT  Clinical Impression  82 yo Female came to ED with left hip fracture. She is s/p ORIF on 05/17/16; Patient is WBAT on LLE. Prior to admittance she was living at home alone. She has a daily home aide and her daughter who comes by to assist with ADLs. Patient ambulates with RW. Patient currently is confused. She had difficulty recalling her home environment and birth year. She reports increased left hip pain. Due to low hemaglobin, PT attempted just bed mobility to assess function. Patient was max A for supine<>Sitting edge of bed. She did not complain of dizziness but was very lethargic. Attempted bed exercise but patient was in too much pain to tolerate on LLE. She would benefit from additional skilled PT intervention to improve balance, strength and gait safety.     Follow Up Recommendations Supervision/Assistance - 24 hour;SNF    Equipment Recommendations  None recommended by PT    Recommendations for Other Services Rehab consult     Precautions / Restrictions Precautions Precautions: Fall Restrictions Weight Bearing Restrictions: Yes LLE Weight Bearing: Weight bearing as tolerated      Mobility  Bed Mobility Overal bed mobility: Needs Assistance Bed Mobility: Supine to Sit;Sit to Supine     Supine to sit: Max assist Sit to supine: Max assist   General bed mobility comments: needs assistance for hand placement and LE  placement; unable to advance LLE forward without assistance; Patient sat up on edge of bed for 1-2 minutes with kyphotic curve and heavy lean to right side due to pain shifting weight to left side;   Transfers                 General transfer comment: unable at this time;   Ambulation/Gait             General Gait Details: unable at this time;  Stairs            Wheelchair Mobility    Modified Rankin (Stroke Patients Only)       Balance Overall balance assessment: Needs assistance Sitting-balance support: Feet unsupported;Bilateral upper extremity supported Sitting balance-Leahy Scale: Poor Sitting balance - Comments: patient was mod A for sitting edge of bed;  Postural control: Right lateral lean                                   Pertinent Vitals/Pain Pain Assessment: Faces Faces Pain Scale: Hurts whole lot Pain Location: pt unable to rate pain but grimaces and cries out with LLE movement; reports pain in left hip  Pain Intervention(s): Limited activity within patient's tolerance;Monitored during session;Patient requesting pain meds-RN notified;Repositioned    Home Living Family/patient expects to be discharged to:: Inpatient rehab Living Arrangements: Alone               Additional Comments: patient lives in a single story home with a few steps to get on the screened  in portch (no rails); lives alone but has daily home aide to assist with bathing and dressing; She has a walk in shower with seat. She uses a RW; her daughter checks on her daily in the evenings; uses meals on wheels.     Prior Function Level of Independence: Needs assistance   Gait / Transfers Assistance Needed: uses RW for walking and can get around home pretty well  ADL's / Homemaking Assistance Needed: needs home aide to assist with bathing/dressing and housework; uses meals on wheels for lunch and daughter asssist with dinner.         Hand Dominance   Dominant  Hand: Right    Extremity/Trunk Assessment   Upper Extremity Assessment: Overall WFL for tasks assessed           Lower Extremity Assessment: RLE deficits/detail;LLE deficits/detail RLE Deficits / Details: hip grossly 4/5, knee 4/5; ROM is WFL; intact light touch sensation LLE Deficits / Details: difficult to asses; MMT not performed on hip; pt has pain with all movement and requires assistance with positioning LLE;   Cervical / Trunk Assessment: Kyphotic  Communication   Communication:  (moderate confusion)  Cognition Arousal/Alertness: Lethargic Behavior During Therapy: WFL for tasks assessed/performed Overall Cognitive Status: History of cognitive impairments - at baseline       Memory: Decreased short-term memory              General Comments      Exercises Other Exercises Other Exercises: RLE: heels slides x10, ankle pumps x10; attempted LLE movement but patient in too much pain; She was able to perform 1 quad set on LLE:    Assessment/Plan    PT Assessment Patient needs continued PT services  PT Problem List Decreased mobility;Decreased strength;Decreased safety awareness;Decreased range of motion;Decreased activity tolerance;Decreased balance;Decreased knowledge of use of DME;Decreased cognition;Pain          PT Treatment Interventions DME instruction;Therapeutic activities;Gait training;Therapeutic exercise;Patient/family education;Stair training;Balance training;Neuromuscular re-education;Functional mobility training    PT Goals (Current goals can be found in the Care Plan section)  Acute Rehab PT Goals Patient Stated Goal: "I want my hip to stop hurting." PT Goal Formulation: With patient Time For Goal Achievement: 06/01/16 Potential to Achieve Goals: Good    Frequency BID   Barriers to discharge Inaccessible home environment;Decreased caregiver support lives alone but does have daily home aide; does have a few small steps to enter house.     Co-evaluation               End of Session   Activity Tolerance: Patient limited by pain Patient left: in bed;with call bell/phone within reach;with family/visitor present Nurse Communication: Mobility status;Patient requests pain meds         Time: 1215-1245 PT Time Calculation (min) (ACUTE ONLY): 30 min   Charges:   PT Evaluation $PT Eval Moderate Complexity: 1 Procedure     PT G Codes:        Trotter,Margaret PT, DPT 05/18/2016, 1:00 PM

## 2016-05-18 NOTE — Progress Notes (Addendum)
   Subjective: 1 Day Post-Op Procedure(s) (LRB): INTRAMEDULLARY (IM) NAIL INTERTROCHANTRIC (Left) Patient reports pain as severe although she is sleeping and appears comfortable.   Patient is well, and has had no acute complaints or problems.  Denies any CP, SOB, ABD pain. Denies feeling dizzy or lightheaded. We will start therapy today.  Plan is to go Skilled nursing facility after hospital stay.  Objective: Vital signs in last 24 hours: Temp:  [97.2 F (36.2 C)-99.6 F (37.6 C)] 99.6 F (37.6 C) (11/12 0413) Pulse Rate:  [48-117] 104 (11/12 0413) Resp:  [14-19] 18 (11/12 0413) BP: (100-151)/(58-84) 109/59 (11/12 0413) SpO2:  [95 %-100 %] 97 % (11/12 0413)  Intake/Output from previous day: 11/11 0701 - 11/12 0700 In: 1789.2 [P.O.:200; I.V.:1589.2] Out: 1225 [Urine:1125; Blood:100] Intake/Output this shift: Total I/O In: 889.2 [P.O.:200; I.V.:689.2] Out: 900 [Urine:900]   Recent Labs  05/16/16 1039 05/17/16 0401 05/18/16 0345  HGB 10.3* 8.1* 7.5*    Recent Labs  05/17/16 0401 05/18/16 0345  WBC 11.7* 11.3*  RBC 3.22* 3.05*  HCT 25.8* 24.7*  PLT 188 184    Recent Labs  05/17/16 0401 05/18/16 0345  NA 142 141  K 4.7 4.3  CL 107 107  CO2 29 29  BUN 33* 36*  CREATININE 1.58* 1.41*  GLUCOSE 170* 128*  CALCIUM 8.8* 8.4*   No results for input(s): LABPT, INR in the last 72 hours.  EXAM General - Patient is Alert, Appropriate and Oriented Extremity - Neurovascular intact Sensation intact distally Intact pulses distally Dorsiflexion/Plantar flexion intact No cellulitis present Compartment soft Dressing - dressing C/D/I and no drainage Motor Function - intact, moving foot and toes well on exam.   Past Medical History:  Diagnosis Date  . Atrial fibrillation (HCC)    on eliquis  . CAD (coronary artery disease)    s/p LAD stent  . CHF (congestive heart failure) (HCC)    systolic dysfunction, EF 20%  . CVA (cerebral vascular accident) (HCC)    in  Sept 2017 with left internal capsule infarct  . Dementia   . Depression   . GERD (gastroesophageal reflux disease)   . Hypertension   . Ischemic cardiomyopathy   . Thyroid disease     Assessment/Plan:   1 Day Post-Op Procedure(s) (LRB): INTRAMEDULLARY (IM) NAIL INTERTROCHANTRIC (Left) Active Problems:   Hip fracture (HCC)  Estimated body mass index is 25.33 kg/m as calculated from the following:   Height as of this encounter: 5\' 3"  (1.6 m).   Weight as of this encounter: 64.9 kg (143 lb). Advance diet Up with therapy  Needs bowel movement Acute post op blood loss anemia with underlying chronic anemia: Hemoglobin 7.5. Recheck labs in the morning. If hemoglobin continues to drop, recommend transfusion. Acute renal insufficiency: Continue with IV fluids, creatinine trending down to 1.41 Care management to assist with discharge.   DVT Prophylaxis - Foot Pumps, TED hose and Eliquis Weight-Bearing as tolerated to left leg   T. Cranston Neighborhris Emon Lance, PA-C [facility] 05/18/2016, 6:53 AM

## 2016-05-18 NOTE — Care Management Important Message (Signed)
Important Message  Patient Details  Name: Idolina PrimerMavis W Littles MRN: [id number] Date of Birth: 11-Nov-1922   Medicare Important Message Given:  Yes    Jc Veron A, RN 05/18/2016, 2:53 PM

## 2016-05-18 NOTE — NC FL2 (Signed)
[place] MEDICAID FL2 LEVEL OF CARE SCREENING TOOL     IDENTIFICATION  Patient Name: Heidi Miller Birthdate: 10-Dec-1922 Sex: female Admission Date (Current Location): 05/16/2016  [place]   Facility and Address:  [facility], [address], [place], [place] [address]      Provider Number: [id number]  Attending Physician Name and Address:  Katharina Caperima Vaickute, MD  Relative Name and Phone Number:       Current Level of Care: Hospital Recommended Level of Care: Skilled Nursing Facility Prior Approval Number:    Date Approved/Denied:   PASRR Number: [id number]  Discharge Plan: SNF    Current Diagnoses: Patient Active Problem List   Diagnosis Date Noted  . Hip fracture (HCC) 05/16/2016  . Stroke (HCC) 03/14/2016  . A-fib (HCC) 03/14/2016  . Dementia 03/14/2016  . HTN (hypertension) 03/14/2016  . Hypothyroidism 03/14/2016    Orientation RESPIRATION BLADDER Height & Weight     Self, Time, Situation, Place  O2 (o2 4L) Continent Weight: 143 lb (64.9 kg) Height:  5\' 3"  (160 cm)  BEHAVIORAL SYMPTOMS/MOOD NEUROLOGICAL BOWEL NUTRITION STATUS      Continent    AMBULATORY STATUS COMMUNICATION OF NEEDS Skin   Extensive Assist Verbally Surgical wounds                       Personal Care Assistance Level of Assistance  Bathing, Dressing Bathing Assistance: Limited assistance   Dressing Assistance: Limited assistance     Functional Limitations Info  Hearing   Hearing Info: Impaired      SPECIAL CARE FACTORS FREQUENCY  PT (By licensed PT), OT (By licensed OT)     PT Frequency: Up to 5X per day, 5 days per week OT Frequency: Up to 5X per day, 5 days per week            Contractures Contractures Info: Present    Additional Factors Info  Code Status Code Status Info: DNR             Current Medications (05/18/2016):  This is the current hospital active medication list Current  Facility-Administered Medications  Medication Dose Route Frequency Provider Last Rate Last Dose  . 0.9 %  sodium chloride infusion   Intravenous Once Katharina Caperima Vaickute, MD      . 0.9 % NaCl with KCl 20 mEq/ L  infusion   Intravenous Continuous Christena FlakeJohn J Poggi, MD 50 mL/hr at 05/17/16 1513    . acetaminophen (TYLENOL) tablet 650 mg  650 mg Oral Q6H PRN Christena FlakeJohn J Poggi, MD       Or  . acetaminophen (TYLENOL) suppository 650 mg  650 mg Rectal Q6H PRN Christena FlakeJohn J Poggi, MD      . apixaban Everlene Balls(ELIQUIS) tablet 2.5 mg  2.5 mg Oral BID Christena FlakeJohn J Poggi, MD   2.5 mg at 05/18/16 0901  . bisacodyl (DULCOLAX) suppository 10 mg  10 mg Rectal Daily PRN Christena FlakeJohn J Poggi, MD      . Chlorhexidine Gluconate Cloth 2 % PADS 6 each  6 each Topical Q0600 Enid Baasadhika Kalisetti, MD   6 each at 05/18/16 0600  . diltiazem (CARDIZEM CD) 24 hr capsule 120 mg  120 mg Oral Daily Katharina Caperima Vaickute, MD   120 mg at 05/18/16 0900  . diphenhydrAMINE (BENADRYL) 12.5 MG/5ML elixir 12.5-25 mg  12.5-25 mg Oral Q4H PRN Christena FlakeJohn J Poggi, MD      . docusate sodium (COLACE) capsule 100 mg  100  mg Oral BID Christena FlakeJohn J Poggi, MD   100 mg at 05/18/16 0900  . HYDROmorphone (DILAUDID) injection 0.5 mg  0.5 mg Intravenous Q2H PRN Christena FlakeJohn J Poggi, MD      . levothyroxine (SYNTHROID, LEVOTHROID) tablet 100 mcg  100 mcg Oral QAC breakfast Enid Baasadhika Kalisetti, MD   100 mcg at 05/18/16 0809  . magnesium hydroxide (MILK OF MAGNESIA) suspension 30 mL  30 mL Oral Daily PRN Christena FlakeJohn J Poggi, MD      . memantine [facility](NAMENDA) tablet 10 mg  10 mg Oral BID Enid Baasadhika Kalisetti, MD   10 mg at 05/18/16 0900  . metoCLOPramide (REGLAN) tablet 5-10 mg  5-10 mg Oral Q8H PRN Christena FlakeJohn J Poggi, MD       Or  . metoCLOPramide (REGLAN) injection 5-10 mg  5-10 mg Intravenous Q8H PRN Christena FlakeJohn J Poggi, MD      . metoprolol tartrate (LOPRESSOR) tablet 25 mg  25 mg Oral Daily Katharina Caperima Vaickute, MD   25 mg at 05/18/16 0900  . mirtazapine (REMERON) tablet 22.5 mg  22.5 mg Oral QHS Enid Baasadhika Kalisetti, MD   22.5 mg at 05/17/16 2048  . mupirocin  ointment (BACTROBAN) 2 % 1 application  1 application Nasal BID Enid Baasadhika Kalisetti, MD   1 application at 05/18/16 0901  . ondansetron (ZOFRAN) tablet 4 mg  4 mg Oral Q6H PRN Christena FlakeJohn J Poggi, MD       Or  . ondansetron [facility](ZOFRAN) injection 4 mg  4 mg Intravenous Q6H PRN Christena FlakeJohn J Poggi, MD      . oxyCODONE (Oxy IR/ROXICODONE) immediate release tablet 5 mg  5 mg Oral Q4H PRN Christena FlakeJohn J Poggi, MD   5 mg at 05/18/16 1306  . pantoprazole (PROTONIX) EC tablet 40 mg  40 mg Oral Daily Enid Baasadhika Kalisetti, MD   40 mg at 05/18/16 0900  . polyethylene glycol (MIRALAX / GLYCOLAX) packet 17 g  17 g Oral Daily PRN Enid Baasadhika Kalisetti, MD      . QUEtiapine (SEROQUEL XR) 24 hr tablet 50 mg  50 mg Oral QPM Enid Baasadhika Kalisetti, MD   50 mg at 05/17/16 1601  . rivastigmine (EXELON) capsule 1.5 mg  1.5 mg Oral BID WC Enid Baasadhika Kalisetti, MD   1.5 mg at 05/18/16 0809  . senna (SENOKOT) tablet 8.6 mg  1 tablet Oral BID Enid Baasadhika Kalisetti, MD   8.6 mg at 05/18/16 0900  . sodium chloride flush (NS) 0.9 % injection 3 mL  3 mL Intravenous Q12H Enid Baasadhika Kalisetti, MD   3 mL at 05/18/16 0901  . sodium phosphate (FLEET) 7-19 GM/118ML enema 1 enema  1 enema Rectal Once PRN Christena FlakeJohn J Poggi, MD      . venlafaxine XR (EFFEXOR-XR) 24 hr capsule 150 mg  150 mg Oral Daily Enid Baasadhika Kalisetti, MD   150 mg at 05/18/16 0900     Discharge Medications: Please see discharge summary for a list of discharge medications.  Relevant Imaging Results:  Relevant Lab Results:   Additional Information  SS #[id number]  Judi CongKaren M Janazia Schreier, LCSW

## 2016-05-19 LAB — TYPE AND SCREEN
ABO/RH(D): AB POS
Antibody Screen: NEGATIVE
UNIT DIVISION: 0

## 2016-05-19 LAB — URINE CULTURE: Culture: NO GROWTH

## 2016-05-19 LAB — BASIC METABOLIC PANEL
Anion gap: 4 — ABNORMAL LOW (ref 5–15)
BUN: 36 mg/dL — AB (ref 6–20)
CHLORIDE: 113 mmol/L — AB (ref 101–111)
CO2: 27 mmol/L (ref 22–32)
CREATININE: 1.01 mg/dL — AB (ref 0.44–1.00)
Calcium: 8.5 mg/dL — ABNORMAL LOW (ref 8.9–10.3)
GFR calc Af Amer: 55 mL/min — ABNORMAL LOW (ref >60)
GFR calc non Af Amer: 47 mL/min — ABNORMAL LOW (ref >60)
GLUCOSE: 182 mg/dL — AB (ref 65–99)
POTASSIUM: 4.3 mmol/L (ref 3.5–5.1)
Sodium: 144 mmol/L (ref 135–145)

## 2016-05-19 LAB — CBC
HEMATOCRIT: 27 % — AB (ref 35.0–47.0)
Hemoglobin: 8.5 g/dL — ABNORMAL LOW (ref 12.0–16.0)
MCH: 26.1 pg (ref 26.0–34.0)
MCHC: 31.7 g/dL — AB (ref 32.0–36.0)
MCV: 82.2 fL (ref 80.0–100.0)
Platelets: 149 10*3/uL — ABNORMAL LOW (ref 150–440)
RBC: 3.28 MIL/uL — ABNORMAL LOW (ref 3.80–5.20)
RDW: 17.2 % — AB (ref 11.5–14.5)
WBC: 8.9 10*3/uL (ref 3.6–11.0)

## 2016-05-19 MED ORDER — DILTIAZEM HCL 30 MG PO TABS: 30.0000 mg | ORAL_TABLET | Freq: Four times a day (QID) | ORAL | Status: DC

## 2016-05-19 MED ORDER — AMIODARONE HCL 200 MG PO TABS
400.0000 mg | ORAL_TABLET | Freq: Two times a day (BID) | ORAL | Status: DC
  Administered 2016-05-19 – 2016-05-20 (×3): 400 mg via ORAL
  Filled 2016-05-19 (×3): qty 2

## 2016-05-19 MED ORDER — ENSURE ENLIVE PO LIQD
237.0000 mL | Freq: Two times a day (BID) | ORAL | Status: DC
  Administered 2016-05-19 – 2016-05-20 (×2): 237 mL via ORAL

## 2016-05-19 NOTE — Progress Notes (Signed)
Inpatient Diabetes Program Recommendations  AACE/ADA: New Consensus Statement on Inpatient Glycemic Control (2015)  Target Ranges:  Prepandial:   less than 140 mg/dL      [facility] postprandial:   less than 180 mg/dL (1-2 hours)      Critically ill patients:  140 - 180 mg/dL   Lab Results  Component Value Date   HGBA1C 6.8 (H) 05/17/2016   Results for Idolina PrimerCARVER, Pansie W (MRN [id number]) as of 05/19/2016 08:52  Ref. Range 05/16/2016 10:39  Glucose Latest Ref Range: 65 - 99 mg/dL 409209 (H)   Review of Glycemic Control  Diabetes history: none, A1C>6.4% Outpatient Diabetes medications: none Current orders for Inpatient glycemic control: none  Inpatient Diabetes Program Recommendations: Patient's A1C = 6.8%, which is consistent with last A1C on 03/15/16 of 6.9%.  If within goals of care for this patient, please consider monitoring CBG's TIDAC and QHS and consider covering any elevated CBG's with Novolog 0-9 units TID and 0-5 units QHS to maintain glycemic control.  Thank you,  Kristine LineaKaren Laketta Soderberg, RN, BSN Diabetes Coordinator Inpatient Diabetes Program [phone] (Team Pager)

## 2016-05-19 NOTE — Progress Notes (Signed)
Clinical Child psychotherapistocial Worker (CSW) presented bed offers to patient. She chose [facility]. CSW contacted patient's daughter Heidi Miller and made her aware of above. [name] liaision is aware of above. CSW sent [name] case manager a message making her aware of above. CSW will continue to follow and assist as needed.   [employer] Hughes IncorporatedBailey Linwood Gullikson, LCSW [phone]

## 2016-05-19 NOTE — Clinical Social Work Placement (Signed)
   CLINICAL SOCIAL WORK PLACEMENT  NOTE  Date:  05/19/2016  Patient Details  Name: Heidi Miller MRN: [id number] Date of Birth: [birth year]  Clinical Social Work is seeking post-discharge placement for this patient at the Skilled  Nursing Facility level of care (*CSW will initial, date and re-position this form in  chart as items are completed):  Yes   Patient/family provided with [facility] Clinical Social Work Department's list of facilities offering this level of care within the geographic area requested by the patient (or if unable, by the patient's family).  Yes   Patient/family informed of their freedom to choose among providers that offer the needed level of care, that participate in Medicare, Medicaid or managed care program needed by the patient, have an available bed and are willing to accept the patient.  Yes   Patient/family informed of [facility]'s ownership interest in [facility] and [facility], as well as of the fact that they are under no obligation to receive care at these facilities.  PASRR submitted to EDS on       PASRR number received on       Existing PASRR number confirmed on 05/18/16     FL2 transmitted to all facilities in geographic area requested by pt/family on 05/18/16     FL2 transmitted to all facilities within larger geographic area on       Patient informed that his/her managed care company has contracts with or will negotiate with certain facilities, including the following:        Yes   Patient/family informed of bed offers received.  Patient chooses bed at  ([facility] )     Physician recommends and patient chooses bed at      Patient to be transferred to   on  .  Patient to be transferred to facility by       Patient family notified on   of transfer.  Name of family member notified:        PHYSICIAN       Additional Comment:    _______________________________________________ Luiza Carranco, Darleen CrockerBailey M, LCSW 05/19/2016, 12:14 PM

## 2016-05-19 NOTE — Progress Notes (Signed)
   Subjective: 2 Days Post-Op Procedure(s) (LRB): INTRAMEDULLARY (IM) NAIL INTERTROCHANTRIC (Left) Patient reports pain as mild .   Patient is well, and has had no acute complaints or problems.  Denies any CP, SOB, ABD pain.  We will continue therapy today.  Plan is to go Skilled nursing facility after hospital stay.  Objective: Vital signs in last 24 hours: Temp:  [97.6 F (36.4 C)-99 F (37.2 C)] 98.1 F (36.7 C) (11/13 0805) Pulse Rate:  [54-130] 124 (11/13 0805) Resp:  [17-18] 18 (11/13 0805) BP: (90-114)/(43-78) 113/73 (11/13 0805) SpO2:  [91 %-100 %] 98 % (11/13 0805)  Intake/Output from previous day: 11/12 0701 - 11/13 0700 In: 1106 [I.V.:750; Blood:356] Out: -  Intake/Output this shift: No intake/output data recorded.   Recent Labs  05/16/16 1039 05/17/16 0401 05/18/16 0345 05/18/16 1457 05/19/16 0402  HGB 10.3* 8.1* 7.5* 9.8* 8.5*    Recent Labs  05/18/16 0345 05/18/16 1457 05/19/16 0402  WBC 11.3*  --  8.9  RBC 3.05*  --  3.28*  HCT 24.7* 31.3* 27.0*  PLT 184  --  149*    Recent Labs  05/18/16 0345 05/19/16 0402  NA 141 144  K 4.3 4.3  CL 107 113*  CO2 29 27  BUN 36* 36*  CREATININE 1.41* 1.01*  GLUCOSE 128* 182*  CALCIUM 8.4* 8.5*   No results for input(s): LABPT, INR in the last 72 hours.  EXAM General - Patient is Alert, Appropriate and Oriented Extremity - Neurovascular intact Sensation intact distally Intact pulses distally Dorsiflexion/Plantar flexion intact No cellulitis present Compartment soft Dressing - dressing C/D/I and no drainage Motor Function - intact, moving foot and toes well on exam.   Past Medical History:  Diagnosis Date  . Atrial fibrillation (HCC)    on eliquis  . CAD (coronary artery disease)    s/p LAD stent  . CHF (congestive heart failure) (HCC)    systolic dysfunction, EF 20%  . CVA (cerebral vascular accident) (HCC)    in Sept 2017 with left internal capsule infarct  . Dementia   . Depression    . GERD (gastroesophageal reflux disease)   . Hypertension   . Ischemic cardiomyopathy   . Thyroid disease     Assessment/Plan:   2 Days Post-Op Procedure(s) (LRB): INTRAMEDULLARY (IM) NAIL INTERTROCHANTRIC (Left) Active Problems:   Hip fracture (HCC)  Estimated body mass index is 25.33 kg/m as calculated from the following:   Height as of this encounter: 5\' 3"  (1.6 m).   Weight as of this encounter: 64.9 kg (143 lb). Advance diet Up with therapy  Needs bowel movement Acute post op blood loss anemia with underlying chronic anemia: Status post one unit of PRBC, hemoglobin stable at 8.5. Recheck hemoglobin in the morning. Acute renal insufficiency: Creatinine down to 1.01 Care management to assist with discharge, skilled nursing facility tomorrow pending medical clearance.Marland Kitchen.   DVT Prophylaxis - Foot Pumps, TED hose and Eliquis Weight-Bearing as tolerated to left leg   T. Cranston Neighborhris Dawn Convery, PA-C [facility] 05/19/2016, 8:08 AM

## 2016-05-19 NOTE — Progress Notes (Signed)
Physical Therapy Treatment Patient Details Name: Heidi Miller MRN: [id number] DOB: Feb 03, 1923 Today's Date: 05/19/2016    History of Present Illness [name]  is a 82 y.o. female with a known history of Atrial fibrillation on eliquis, recent admission 2 months ago for an acute CVA with minimal right facial deficits, Alzheimer's dementia, depression, hypertension, ischemic cardiomyopathy with EF of 20%, hypothyroidism presents from home secondary to a fall and noted to have left hip intertrochanteric fracture. Patient is s/p LLE ORIF on 05/17/16; She is WBAT    PT Comments    Pt in bed, awoke easily and agreed to session.  Participated in exercises as described below. She was assisted to sitting with max a x 2.  She stood x 1 at bedside with max a x 2 with walker and found to be incontinent of urine.  Returned to supine for appropriate care prior to getting up to chair.  Max a x 2 for all rolling. Returned to sitting where she transferred to recliner with max a x 2 without assistive device.  She was positioned in recliner to comfort.     Follow Up Recommendations  SNF     Equipment Recommendations       Recommendations for Other Services       Precautions / Restrictions Precautions Precautions: Fall Restrictions Weight Bearing Restrictions: Yes LLE Weight Bearing: Weight bearing as tolerated    Mobility  Bed Mobility Overal bed mobility: Needs Assistance Bed Mobility: Supine to Sit;Sit to Supine     Supine to sit: Max assist;+2 for physical assistance Sit to supine: Max assist;+2 for physical assistance   General bed mobility comments: needs assistance for transitions and rolling for incontinence care  Transfers Overall transfer level: Needs assistance Equipment used: None;2 person hand held assist Transfers: Sit to/from [employer] CorporationStand;Stand Pivot Transfers Sit to Stand: Mod assist;+2 physical assistance (with walker) Stand pivot transfers: Max assist;+2 physical  assistance (no device used)       General transfer comment: poor quality and little assistance  Ambulation/Gait             General Gait Details: unable at this time;   Stairs            Wheelchair Mobility    Modified Rankin (Stroke Patients Only)       Balance Overall balance assessment: Needs assistance Sitting-balance support: Feet supported Sitting balance-Leahy Scale: Poor Sitting balance - Comments: patient was mod A for sitting edge of bed;    Standing balance support: Bilateral upper extremity supported Standing balance-Leahy Scale: Zero                      Cognition Arousal/Alertness: Awake/alert Behavior During Therapy: WFL for tasks assessed/performed Overall Cognitive Status: History of cognitive impairments - at baseline                      Exercises Other Exercises Other Exercises: LLE ankle pumps, heel slides, AB/ADD x 10 AAROM Other Exercises: rolling left and right for incontinace care    General Comments        Pertinent Vitals/Pain Pain Assessment: 0-10 Pain Score: 4  Pain Location: hip Pain Descriptors / Indicators:  (denies pain at rest, soreness with movement) Pain Intervention(s): Limited activity within patient's tolerance    Home Living                      Prior Function  PT Goals (current goals can now be found in the care plan section) Progress towards PT goals: Progressing toward goals    Frequency    BID      PT Plan Discharge plan needs to be updated    Co-evaluation             End of Session Equipment Utilized During Treatment: Gait belt;Oxygen Activity Tolerance: Patient limited by fatigue Patient left: in chair;with call bell/phone within reach;with chair alarm set     Time: 1125-1150 PT Time Calculation (min) (ACUTE ONLY): 25 min  Charges:  $Therapeutic Exercise: 8-22 mins $Therapeutic Activity: 8-22 mins                    G Codes:       Danielle DessSarah Yuan Gann 05/19/2016, 1:30 PM

## 2016-05-19 NOTE — Evaluation (Signed)
Clinical/Bedside Swallow Evaluation Patient Details  Name: Heidi Miller MRN: [id number] Date of Birth: 1923/05/15  Today's Date: 05/19/2016 Time: SLP Start Time (ACUTE ONLY): 1000 SLP Stop Time (ACUTE ONLY): 1100 SLP Time Calculation (min) (ACUTE ONLY): 60 min  Past Medical History:  Past Medical History:  Diagnosis Date  . Atrial fibrillation (HCC)    on eliquis  . CAD (coronary artery disease)    s/p LAD stent  . CHF (congestive heart failure) (HCC)    systolic dysfunction, EF 20%  . CVA (cerebral vascular accident) (HCC)    in Sept 2017 with left internal capsule infarct  . Dementia   . Depression   . GERD (gastroesophageal reflux disease)   . Hypertension   . Ischemic cardiomyopathy   . Thyroid disease    Past Surgical History:  Past Surgical History:  Procedure Laterality Date  . CARDIAC CATHETERIZATION    . CHOLECYSTECTOMY    . FOOT SURGERY    . HERNIA REPAIR    . resection of left acoustic neuroma    . Right rotator cuff repair    . TONSILLECTOMY     HPI:  Pt is a 82 y.o. female with a known history of Atrial fibrillation on eliquis, recent admission 2 months ago for an acute CVA with minimal right facial deficits, Alzheimer's dementia, depression, hypertension, ischemic cardiomyopathy with EF of 20%, hypothyroidism presents from home secondary to a fall and noted to have left hip intertrochanteric fracture.   Assessment / Plan / Recommendation Clinical Impression  Pt appeared to adequately tolerate trials of thin liquids and purees with no immediate overt s/s of aspiration during/post po trials following strict aspiration precautions. However pt did require mod-max cues during oral intake. Pt appears at minimal risk for aspiration following aspiraiontion precautions, due to being a dependent feeder at this time and need for moderate-max verbal and tacticle cues. No decline in vocal quality or respiratory status w/ po intake noted. Oral phase grossly wfl for bolus  management and clearing for the few puree and thin liquid trials given, solids were not tested at this time due to not wearing dentures. Pt does wear dentures to eat regularly, however Pt unable to wear dentures at this time due to pain. Pt was lying in bed with eyes closed, appeared to present as "shut - down". Often moaned a little and endorsed pain. Nursing informed. During BSE pt required moderate-max verbal and tactile cues to attend to task of taking po's. Pt will require full feeding assistance, tray set up, and full monitoring during meals at this time. Pt does have baseline of Cognitive decline/Dementia and would benefit from following aspiration precautions as well as having full monitoring during meals to ensure safety w/ oral intake. Recommend a Dysphagia 1 diet with thin liquids for easier mastication at this time. ST services will f/u w/ toleration of diet while admitted and make any needed adjustments to diet consistency as indicated.    Aspiration Risk  Mild aspiration risk    Diet Recommendation Dysphagia 1 (Puree);Thin liquid   Liquid Administration via: Cup;Straw Medication Administration: Crushed with puree (As able) Supervision: Staff to assist with self feeding;Full supervision/cueing for compensatory strategies Compensations: Minimize environmental distractions;Slow rate;Small sips/bites;Follow solids with liquid Postural Changes: Seated upright at 90 degrees;Remain upright for at least 30 minutes after po intake    Other  Recommendations Recommended Consults:  (Dietician Consult) Oral Care Recommendations: Oral care BID;Staff/trained caregiver to provide oral care   Follow up Recommendations  (TBD)  Frequency and Duration min 3x week  2 weeks       Prognosis Prognosis for Safe Diet Advancement: Good Barriers to Reach Goals: Cognitive deficits      Swallow Study   General Date of Onset: 05/16/16 HPI: Pt is a 82 y.o. female with a known history of Atrial  fibrillation on eliquis, recent admission 2 months ago for an acute CVA with minimal right facial deficits, Alzheimer's dementia, depression, hypertension, ischemic cardiomyopathy with EF of 20%, hypothyroidism presents from home secondary to a fall and noted to have left hip intertrochanteric fracture. Type of Study: Bedside Swallow Evaluation Previous Swallow Assessment: 03/15/16 Diet Prior to this Study: Dysphagia 3 (soft);Thin liquids Temperature Spikes Noted: No (WBC 8.9) Respiratory Status: Nasal cannula (2.0 L) History of Recent Intubation: No Behavior/Cognition: Cooperative (Drowsy, Shut Down due to impact of pain) Oral Cavity Assessment: Within Functional Limits Oral Care Completed by SLP: Recent completion by staff Oral Cavity - Dentition: Dentures, not available;Dentures, top;Dentures, bottom (Pt not alert enough to wear dentures at this time) Vision:  (N/A - Not feeding self at this time) Self-Feeding Abilities: Needs assist;Needs set up;Total assist Patient Positioning: Upright in bed Baseline Vocal Quality: Normal Volitional Cough: Strong Volitional Swallow: Able to elicit    Oral/Motor/Sensory Function Overall Oral Motor/Sensory Function: Within functional limits   Ice Chips Ice chips: Not tested   Thin Liquid Thin Liquid: Within functional limits Presentation: Cup;Straw (9 trials)    Nectar Thick Nectar Thick Liquid: Not tested   Honey Thick Honey Thick Liquid: Not tested   Puree Puree: Within functional limits Presentation: Spoon (3 trials)   Solid   GO   Solid: Not tested        Altamese DillingLauren Muller, B.A. Clinical Graduate Student 05/19/2016,11:13 AM   Jerilynn SomKatherine Alise Calais, MS, CCC-SLP

## 2016-05-19 NOTE — Progress Notes (Signed)
[facility] - [place] at [facility]   PATIENT NAME: Heidi Miller    MR#:  [id number]  DATE OF BIRTH:  08/13/1922  SUBJECTIVE:  CHIEF COMPLAINT:   Chief Complaint  Patient presents with  . Hip Pain  . Fall  The patient is 82 year old Caucasian female with past medical history significant for history of A. fib, stroke, right-sided facial weakness, also, dementia, depression, hypertension, ischemic cardiomyopathy, ejection fraction of 20%, hypothyroidism, who presents to the hospital after fall and was noted to have left hip intertrochanteric fracture.  The patient underwent  intramedullay nail placement  05/17/2016 by Dr Joice Lofts.  The patient feels good today. Denies any significant pain in the operative site. She was noted to be anemic, hemoglobin level of 7.5, The patient was transfused 1 unit of packed blood cells after which hemoglobin level improved to 8.5 today. Patient still remains tachycardic, A. fib, RVR, rate as high as 130 intermittently, however, pulse dropped down to 50s at nighttime. Weaned down to 2 L of oxygen through nasal cannulas physical O2 sats. Repeat the chest x-ray as of yesterday revealed bibasal atelectasis. Patient denies any significant pain or shortness of breath. She is to receive physical therapy today and go to skilled nursing facility tomorrow..    Review of Systems  Constitutional: Negative for chills, fever and weight loss.  HENT: Negative for congestion.   Eyes: Negative for blurred vision and double vision.  Respiratory: Negative for cough, sputum production, shortness of breath and wheezing.   Cardiovascular: Negative for chest pain, palpitations, orthopnea, leg swelling and PND.  Gastrointestinal: Negative for abdominal pain, blood in stool, constipation, diarrhea, nausea and vomiting.  Genitourinary: Negative for dysuria, frequency, hematuria and urgency.  Musculoskeletal: Positive for joint pain. Negative for falls.   Neurological: Negative for dizziness, tremors, focal weakness and headaches.  Endo/Heme/Allergies: Does not bruise/bleed easily.  Psychiatric/Behavioral: Negative for depression. The patient does not have insomnia.     VITAL SIGNS: Blood pressure 113/73, pulse (!) 124, temperature 98.1 F (36.7 C), temperature source Oral, resp. rate 18, height 5\' 3"  (1.6 m), weight 64.9 kg (143 lb), SpO2 98 %.  PHYSICAL EXAMINATION:   GENERAL:  82 y.o.-year-old patient sitting in bed, ready to start her breakfast, comfortable  EYES: Pupils equal, round, reactive to light and accommodation. No scleral icterus. Extraocular muscles intact.  HEENT: Head atraumatic, normocephalic. Oropharynx and nasopharynx clear.  NECK:  Supple, no jugular venous distention. No thyroid enlargement, no tenderness.  LUNGS: Normal breath sounds bilaterally, no wheezing, rales,rhonchi or crepitation. No use of accessory muscles of respiration.  CARDIOVASCULAR: S1, S2 , rhythm was irregularly irregular, tachycardic. No murmurs, rubs, or gallops.  ABDOMEN: Soft, nontender, nondistended. Bowel sounds present. No organomegaly or mass.  EXTREMITIES: No pedal edema, cyanosis, or clubbing. Left lower extremity has dressing on the side with some swelling of left thigh, minimal old blood, no drainage, no significant pain on palpation, no bleeding  NEUROLOGIC: Cranial nerves II through XII are grossly intact, but patient is somnolent and not able to obtain full exam. Muscle strengthunable to assess. Sensation unable to assess . Gait not checked.  PSYCHIATRIC: The patient is alert, oriented 2  SKIN: No obvious rash, lesion, or ulcer.   ORDERS/RESULTS REVIEWED:   CBC  Recent Labs Lab 05/16/16 1039 05/17/16 0401 05/18/16 0345 05/18/16 1457 05/19/16 0402  WBC 12.0* 11.7* 11.3*  --  8.9  HGB 10.3* 8.1* 7.5* 9.8* 8.5*  HCT 33.3* 25.8* 24.7* 31.3* 27.0*  PLT 255  188 184  --  149*  MCV 81.3 80.1 81.1  --  82.2  MCH 25.1* 25.3* 24.6*   --  26.1  MCHC 30.9* 31.5* 30.3*  --  31.7*  RDW 17.4* 17.5* 18.0*  --  17.2*   ------------------------------------------------------------------------------------------------------------------  Chemistries   Recent Labs Lab 05/16/16 1039 05/17/16 0401 05/18/16 0345 05/19/16 0402  NA 141 142 141 144  K 4.4 4.7 4.3 4.3  CL 105 107 107 113*  CO2 24 29 29 27   GLUCOSE 209* 170* 128* 182*  BUN 27* 33* 36* 36*  CREATININE 1.26* 1.58* 1.41* 1.01*  CALCIUM 9.8 8.8* 8.4* 8.5*   ------------------------------------------------------------------------------------------------------------------ estimated creatinine clearance is 32.9 mL/min (by C-G formula based on SCr of 1.01 mg/dL (H)). ------------------------------------------------------------------------------------------------------------------ No results for input(s): TSH, T4TOTAL, T3FREE, THYROIDAB in the last 72 hours.  Invalid input(s): FREET3  Cardiac Enzymes No results for input(s): CKMB, TROPONINI, MYOGLOBIN in the last 168 hours.  Invalid input(s): CK ------------------------------------------------------------------------------------------------------------------ Invalid input(s): POCBNP ---------------------------------------------------------------------------------------------------------------  RADIOLOGY: Dg Chest Port 1 View  Result Date: 05/18/2016 CLINICAL DATA:  Hypoxia. Status post left hip ORIF for hip fracture yesterday. EXAM: PORTABLE CHEST 1 VIEW COMPARISON:  05/16/2016 FINDINGS: The heart size is stable and mildly enlarged. Lung volumes are low with bibasilar atelectasis present. There is no evidence of pulmonary edema, consolidation, pneumothorax, nodule or pleural fluid. The visualized skeletal structures are unremarkable. IMPRESSION: Bibasilar atelectasis. Electronically Signed   By: Irish LackGlenn  Yamagata M.D.   On: 05/18/2016 10:54   Dg Hip Operative Unilat W Or W/o Pelvis Left  Result Date:  05/17/2016 CLINICAL DATA:  ORIF left proximal femur fracture EXAM: OPERATIVE LEFT HIP (WITH PELVIS IF PERFORMED) 4 VIEWS TECHNIQUE: Fluoroscopic spot image(s) were submitted for interpretation post-operatively. COMPARISON:  05/16/2016 left hip radiographs. FINDINGS: Fluoroscopy time 0 minutes 46 seconds. 4 nondiagnostic spot fluoroscopic intraoperative left femur radiographs demonstrate transfixation of comminuted intertrochanteric left proximal femur fracture by intra medullary rod and interlocking left femoral neck pin and distal interlocking screw. Retracted left lesser trochanter fracture fragment. IMPRESSION: Intraoperative fluoroscopic guidance for ORIF intertrochanteric left femur fracture. Electronically Signed   By: Delbert PhenixJason A Poff M.D.   On: 05/17/2016 11:55    EKG:  Orders placed or performed during the hospital encounter of 03/14/16  . ED EKG  . ED EKG  . EKG 12-Lead  . EKG 12-Lead    ASSESSMENT AND PLAN:  Active Problems:   Hip fracture (HCC) #1. Comminuted Left intertrochanteric hip fracture, closed,  status post intramedullary nail placement 11th of November 2017 by Dr. Joice LoftsPoggi, the patient had moderate perioperative risks for cardiovascular event, such as age, functional status, cardiomyopathy, atrial fibrillation. Supportive therapy, physical therapy, anticoagulation according to surgery, likely discharge to skilled nursing facility tomorrow  #2. Acute renal insufficiency, some improvement on IV fluids at lower rate, urinalysis revealed pyuria, initially concerning for urinary tract infection, however, urinary culture was negative #3. Hyperglycemia, hemoglobin A1c was 6.8, prediabetes, patient may benefit from a diabetic education, diabetic diet #4. Hypoxia, was thought to be related to pain medications, repeat the chest x-ray revealed a basal atelectasis, continue incentive spirometry , patient was weaned down to 2 L of oxygen through nasal cannula, continue weaning #5. Acute post  hemorrhagic anemia,  status post one unit of packed red blood cell transfusion, hemoglobin level has improved, follow closely #6. Hypotension, resolved after transfusion #7. A. fib, RVR, continue Cardizem, added amiodarone for rate stabilization , unlikely to be continued chronically   Managment plans discussed with the  patient, family and they are in agreement.   DRUG ALLERGIES: No Known Allergies  CODE STATUS:     Code Status Orders        Start     Ordered   05/16/16 1439  Do not attempt resuscitation (DNR)  Continuous    Question Answer Comment  In the event of cardiac or respiratory ARREST Do not call a "code blue"   In the event of cardiac or respiratory ARREST Do not perform Intubation, CPR, defibrillation or ACLS   In the event of cardiac or respiratory ARREST Use medication by any route, position, wound care, and other measures to relive pain and suffering. May use oxygen, suction and manual treatment of airway obstruction as needed for comfort.      05/16/16 1438    Code Status History    Date Active Date Inactive Code Status Order ID Comments User Context   03/16/2016 10:36 AM 03/17/2016  5:14 PM DNR [id number]  Auburn BilberryShreyang Patel, MD Inpatient   03/15/2016  1:08 AM 03/15/2016 10:36 AM Full Code [id number]  Oralia Manisavid Willis, MD Inpatient    Advance Directive Documentation   Flowsheet Row Most Recent Value  Type of Advance Directive  Healthcare Power of Attorney  Pre-existing out of facility DNR order (yellow form or pink MOST form)  No data  "MOST" Form in Place?  No data      TOTAL TIME TAKING CARE OF THIS PATIENT: 40 minutes.    Katharina CaperVAICKUTE,Roderica Cathell M.D on 05/19/2016 at 11:51 AM  Between 7am to 6pm - Pager - [phone]  After 6pm go to [email or web address] - password [email or web address] [facility]  [facility]  Office  [phone]  CC: Primary care physician; BABAOFF, Lavada MesiMARC E, MD

## 2016-05-19 NOTE — Progress Notes (Signed)
Initial Nutrition Assessment  DOCUMENTATION CODES:   Not applicable  INTERVENTION:  1. Patient is a full-feeding assist 2. Ensure Enlive po BID, each supplement provides 350 kcal and 20 grams of protein  NUTRITION DIAGNOSIS:   Inadequate oral intake related to poor appetite as evidenced by per patient/family report.  GOAL:   Patient will meet greater than or equal to 90% of their needs  MONITOR:   PO intake, I & O's, Labs, Weight trends, Supplement acceptance  REASON FOR ASSESSMENT:   Other (Comment) (Hip Fx)    ASSESSMENT:   Heidi Miller  is a 82 y.o. female presents from home secondary to a fall and noted to have left hip intertrochanteric fracture  Spoke Ms. Haven's RN at bedside, patient with dementia s/p mechanical fall and hip fx. Patient was unable to iterate if she has had any weight loss. Per chart she exhibits a 7#/4.7% insignificant wt loss over 1 year. PO intake has been good - 75% logged from 11/10.  Her RN states she ate 75% ate breakfast, had consumed almost 100% of lunch during my visit. Nutrition-Focused physical exam completed. Findings are moderate fat depletion, no muscle depletion, and no edema.   Labs and medications reviewed: Colace, Remeron, Senokot  Diet Order:  DIET - DYS 1 Room service appropriate? Yes with Assist; Fluid consistency: Thin  Skin:  Reviewed, no issues  Last BM:  PTA  Height:   Ht Readings from Last 1 Encounters:  05/16/16 5\' 3"  (1.6 m)    Weight:   Wt Readings from Last 1 Encounters:  05/16/16 143 lb (64.9 kg)    Ideal Body Weight:  52.27 kg  BMI:  Body mass index is 25.33 kg/m.  Estimated Nutritional Needs:   Kcal:  1625-1950 calories  Protein:  65-78 gm  Fluid:  >/= 1.6L  EDUCATION NEEDS:   No education needs identified at this time  Dionne AnoWilliam M. Paizlee Kinder, MS, RD LDN Inpatient Clinical Dietitian Pager [phone]

## 2016-05-19 NOTE — Plan of Care (Signed)
Problem: SLP Dysphagia Goals Goal: Misc Dysphagia Goal Pt will safely tolerate po diet of least restrictive consistency w/ no overt s/s of aspiration noted by Staff/pt/family x3 sessions.    

## 2016-05-19 NOTE — Progress Notes (Signed)
Physical Therapy Treatment Patient Details Name: Heidi Miller MRN: [id number] DOB: [birth year] Today's Date: 05/19/2016    History of Present Illness Heidi Miller  is a 82 y.o. female with a known history of Atrial fibrillation on eliquis, recent admission 2 months ago for an acute CVA with minimal right facial deficits, Alzheimer's dementia, depression, hypertension, ischemic cardiomyopathy with EF of 20%, hypothyroidism presents from home secondary to a fall and noted to have left hip intertrochanteric fracture. Patient is s/p LLE ORIF on 05/17/16; She is WBAT    PT Comments    Pt in chair ready to return to bed.  Max a x 2 dependant transfer back to bed. Max a rolling. Pt positioned to comfort.  Pt fatigued and ready for sleep so exercises deferred this pm.  Follow Up Recommendations  SNF     Equipment Recommendations  None recommended by PT    Recommendations for Other Services       Precautions / Restrictions Precautions Precautions: Fall Restrictions Weight Bearing Restrictions: Yes LLE Weight Bearing: Weight bearing as tolerated    Mobility  Bed Mobility Overal bed mobility: Needs Assistance Bed Mobility: Sit to Supine     Supine to sit: Max assist;+2 for physical assistance Sit to supine: Max assist;+2 for physical assistance   General bed mobility comments: max a for rolling left and right and scooting up in bed  Transfers Overall transfer level: Needs assistance Equipment used: None;2 person hand held assist Transfers: Stand Pivot Transfers Sit to Stand: Max assist;+2 physical assistance Stand pivot transfers: Max assist;+2 physical assistance       General transfer comment: poor quality and little assistance  Ambulation/Gait             General Gait Details: unable at this time;   Information systems managertairs            Wheelchair Mobility    Modified Rankin (Stroke Patients Only)       Balance Overall balance assessment: Needs  assistance Sitting-balance support: Feet supported Sitting balance-Leahy Scale: Poor Sitting balance - Comments: patient was mod A for sitting edge of bed;    Standing balance support: Bilateral upper extremity supported Standing balance-Leahy Scale: Zero                      Cognition Arousal/Alertness: Lethargic Behavior During Therapy: WFL for tasks assessed/performed Overall Cognitive Status: History of cognitive impairments - at baseline       Memory: Decreased short-term memory              Exercises Other Exercises Other Exercises: LLE ankle pumps, heel slides, AB/ADD x 10 AAROM Other Exercises: rolling left and right for incontinace care    General Comments        Pertinent Vitals/Pain Pain Assessment: 0-10 Pain Score: 4  Pain Location: hip Pain Descriptors / Indicators:  (denies pain at rest, soreness with movement) Pain Intervention(s): Limited activity within patient's tolerance    Home Living                      Prior Function            PT Goals (current goals can now be found in the care plan section) Progress towards PT goals: Progressing toward goals    Frequency    BID      PT Plan Current plan remains appropriate    Co-evaluation  End of Session Equipment Utilized During Treatment: Gait belt;Oxygen Activity Tolerance: Patient limited by fatigue Patient left: in bed;with bed alarm set;with call bell/phone within reach     Time: 1340-1352 PT Time Calculation (min) (ACUTE ONLY): 12 min  Charges:  $Therapeutic Exercise: 8-22 mins $Therapeutic Activity: 8-22 mins                    G Codes:      Danielle DessSarah Evangela Heffler 05/19/2016, 2:13 PM

## 2016-05-20 ENCOUNTER — Encounter: Payer: Self-pay | Admitting: Surgery

## 2016-05-20 DIAGNOSIS — D62 Acute posthemorrhagic anemia: Secondary | ICD-10-CM

## 2016-05-20 DIAGNOSIS — R8281 Pyuria: Secondary | ICD-10-CM

## 2016-05-20 DIAGNOSIS — E870 Hyperosmolality and hypernatremia: Secondary | ICD-10-CM

## 2016-05-20 DIAGNOSIS — R0902 Hypoxemia: Secondary | ICD-10-CM

## 2016-05-20 DIAGNOSIS — I4891 Unspecified atrial fibrillation: Secondary | ICD-10-CM

## 2016-05-20 DIAGNOSIS — Z9289 Personal history of other medical treatment: Secondary | ICD-10-CM

## 2016-05-20 DIAGNOSIS — Z22322 Carrier or suspected carrier of Methicillin resistant Staphylococcus aureus: Secondary | ICD-10-CM

## 2016-05-20 DIAGNOSIS — I959 Hypotension, unspecified: Secondary | ICD-10-CM

## 2016-05-20 DIAGNOSIS — N289 Disorder of kidney and ureter, unspecified: Secondary | ICD-10-CM

## 2016-05-20 DIAGNOSIS — J9811 Atelectasis: Secondary | ICD-10-CM

## 2016-05-20 DIAGNOSIS — R7303 Prediabetes: Secondary | ICD-10-CM

## 2016-05-20 LAB — CBC
HCT: 28.2 % — ABNORMAL LOW (ref 35.0–47.0)
Hemoglobin: 8.9 g/dL — ABNORMAL LOW (ref 12.0–16.0)
MCH: 25.8 pg — ABNORMAL LOW (ref 26.0–34.0)
MCHC: 31.5 g/dL — ABNORMAL LOW (ref 32.0–36.0)
MCV: 82.1 fL (ref 80.0–100.0)
PLATELETS: 177 10*3/uL (ref 150–440)
RBC: 3.43 MIL/uL — AB (ref 3.80–5.20)
RDW: 17.4 % — AB (ref 11.5–14.5)
WBC: 9.3 10*3/uL (ref 3.6–11.0)

## 2016-05-20 LAB — BASIC METABOLIC PANEL
Anion gap: 5 (ref 5–15)
BUN: 46 mg/dL — AB (ref 6–20)
CALCIUM: 8.9 mg/dL (ref 8.9–10.3)
CHLORIDE: 115 mmol/L — AB (ref 101–111)
CO2: 29 mmol/L (ref 22–32)
CREATININE: 0.95 mg/dL (ref 0.44–1.00)
GFR, EST AFRICAN AMERICAN: 59 mL/min — AB (ref >60)
GFR, EST NON AFRICAN AMERICAN: 51 mL/min — AB (ref >60)
Glucose, Bld: 159 mg/dL — ABNORMAL HIGH (ref 65–99)
Potassium: 4.4 mmol/L (ref 3.5–5.1)
SODIUM: 149 mmol/L — AB (ref 135–145)

## 2016-05-20 MED ORDER — ENSURE ENLIVE PO LIQD: 237.0000 mL | Freq: Two times a day (BID) | ORAL | 12 refills | Status: DC

## 2016-05-20 MED ORDER — DEXTROSE 5 % IV SOLN
INTRAVENOUS | Status: AC
  Administered 2016-05-20: 09:00:00 via INTRAVENOUS

## 2016-05-20 MED ORDER — MUPIROCIN 2 % EX OINT: 1.0000 "application " | TOPICAL_OINTMENT | Freq: Two times a day (BID) | CUTANEOUS | 0 refills | Status: DC

## 2016-05-20 MED ORDER — OXYCODONE HCL 5 MG PO TABS: 5.0000 mg | ORAL_TABLET | ORAL | 0 refills | Status: DC | PRN

## 2016-05-20 MED ORDER — DOCUSATE SODIUM 100 MG PO CAPS: 100.0000 mg | ORAL_CAPSULE | Freq: Two times a day (BID) | ORAL | 0 refills | Status: DC

## 2016-05-20 MED ORDER — SENNA 8.6 MG PO TABS: 1.0000 | ORAL_TABLET | Freq: Two times a day (BID) | ORAL | 0 refills | Status: DC

## 2016-05-20 MED ORDER — POLYETHYLENE GLYCOL 3350 17 G PO PACK: 17.0000 g | PACK | Freq: Every day | ORAL | 0 refills | Status: DC | PRN

## 2016-05-20 NOTE — Progress Notes (Signed)
[facility] [place] [facility]   PATIENT NAME: Heidi RamMavis Elsen    MR#:  [id number]  DATE OF BIRTH:  [birth year]  SUBJECTIVE:  CHIEF COMPLAINT:   Chief Complaint  Patient presents with  . Hip Pain  . Fall  The patient is 82 year old Caucasian female with past medical history significant for history of A. fib, stroke, right-sided facial weakness, also, dementia, depression, hypertension, ischemic cardiomyopathy, ejection fraction of 20%, hypothyroidism, who presents to the hospital after fall and was noted to have left hip intertrochanteric fracture.  The patient underwent  intramedullay nail placement  05/17/2016 by Dr Joice LoftsPoggi.  The patient feels good today. Denies any significant pain in the operative site. She was noted to be anemic, hemoglobin level of 7.5, The patient was transfused 1 unit of packed blood cells after which hemoglobin level improved to 8.9 today. Patient was tachycardic, A. fib, RVR, rate as high as 130 postoperatively, however, resumed on her usual medications and her heart rate has improved. Weaned down to 1 L of oxygen through nasal cannula. The patient feels comfortable today, denies any significant pain. Her sodium level was found to be elevated at 149 today, now on D5 water solution.   Review of Systems  Constitutional: Negative for chills, fever and weight loss.  HENT: Negative for congestion.   Eyes: Negative for blurred vision and double vision.  Respiratory: Negative for cough, sputum production, shortness of breath and wheezing.   Cardiovascular: Negative for chest pain, palpitations, orthopnea, leg swelling and PND.  Gastrointestinal: Negative for abdominal pain, blood in stool, constipation, diarrhea, nausea and vomiting.  Genitourinary: Negative for dysuria, frequency, hematuria and urgency.  Musculoskeletal: Positive for joint pain. Negative for falls.  Neurological: Negative for dizziness, tremors, focal weakness and headaches.   Endo/Heme/Allergies: Does not bruise/bleed easily.  Psychiatric/Behavioral: Negative for depression. The patient does not have insomnia.     VITAL SIGNS: Blood pressure (!) 117/94, pulse (!) 58, temperature 98.6 F (37 C), temperature source Oral, resp. rate 20, height 5\' 3"  (1.6 m), weight 64.9 kg (143 lb), SpO2 98 %.  PHYSICAL EXAMINATION:   GENERAL:  82 y.o.-year-old patient slumped in bed, somnolent, but opens her eyes and converses appropriately EYES: Pupils equal, round, reactive to light and accommodation. No scleral icterus. Extraocular muscles intact.  HEENT: Head atraumatic, normocephalic. Oropharynx and nasopharynx clear.  NECK:  Supple, no jugular venous distention. No thyroid enlargement, no tenderness.  LUNGS: Normal breath sounds bilaterally, no wheezing, rales,rhonchi or crepitation. No use of accessory muscles of respiration.  CARDIOVASCULAR: S1, S2 , rhythm was irregularly irregular, tachycardic. No murmurs, rubs, or gallops.  ABDOMEN: Soft, nontender, nondistended. Bowel sounds present. No organomegaly or mass.  EXTREMITIES: No pedal edema, cyanosis, or clubbing. Left lower extremity has dressing on the side with some swelling of left thigh, minimal old blood, no drainage, no significant pain on palpation, no bleeding  NEUROLOGIC: Cranial nerves II through XII are grossly intact, but patient is somnolent and not able to obtain full exam. Muscle strengthunable to assess. Sensation unable to assess . Gait not checked.  PSYCHIATRIC: The patient is somnolent, oriented 2  SKIN: No obvious rash, lesion, or ulcer.   ORDERS/RESULTS REVIEWED:   CBC  Recent Labs Lab 05/16/16 1039 05/17/16 0401 05/18/16 0345 05/18/16 1457 05/19/16 0402 05/20/16 0421  WBC 12.0* 11.7* 11.3*  --  8.9 9.3  HGB 10.3* 8.1* 7.5* 9.8* 8.5* 8.9*  HCT 33.3* 25.8* 24.7* 31.3* 27.0* 28.2*  PLT 255 188 184  --  149* 177  MCV 81.3 80.1 81.1  --  82.2 82.1  MCH 25.1* 25.3* 24.6*  --  26.1 25.8*   MCHC 30.9* 31.5* 30.3*  --  31.7* 31.5*  RDW 17.4* 17.5* 18.0*  --  17.2* 17.4*   ------------------------------------------------------------------------------------------------------------------  Chemistries   Recent Labs Lab 05/16/16 1039 05/17/16 0401 05/18/16 0345 05/19/16 0402 05/20/16 0421  NA 141 142 141 144 149*  K 4.4 4.7 4.3 4.3 4.4  CL 105 107 107 113* 115*  CO2 24 29 29 27 29   GLUCOSE 209* 170* 128* 182* 159*  BUN 27* 33* 36* 36* 46*  CREATININE 1.26* 1.58* 1.41* 1.01* 0.95  CALCIUM 9.8 8.8* 8.4* 8.5* 8.9   ------------------------------------------------------------------------------------------------------------------ estimated creatinine clearance is 35 mL/min (by C-G formula based on SCr of 0.95 mg/dL). ------------------------------------------------------------------------------------------------------------------ No results for input(s): TSH, T4TOTAL, T3FREE, THYROIDAB in the last 72 hours.  Invalid input(s): FREET3  Cardiac Enzymes No results for input(s): CKMB, TROPONINI, MYOGLOBIN in the last 168 hours.  Invalid input(s): CK ------------------------------------------------------------------------------------------------------------------ Invalid input(s): POCBNP ---------------------------------------------------------------------------------------------------------------  RADIOLOGY: No results found.  EKG:  Orders placed or performed during the hospital encounter of 03/14/16  . ED EKG  . ED EKG  . EKG 12-Lead  . EKG 12-Lead    ASSESSMENT AND PLAN:  Active Problems:   Hip fracture (HCC) #1. Comminuted Left intertrochanteric hip fracture, closed,  status post intramedullary nail placement 11th of November 2017 by Dr. Joice Lofts, the patient had moderate perioperative risks for cardiovascular event, such as age, functional status, cardiomyopathy, atrial fibrillation. Supportive therapy, physical therapy, anticoagulation with eliquis, likely  discharge to skilled nursing facility today or tomorrow  #2. Acute renal insufficiency, resolved on  IV fluids , urinalysis revealed pyuria, initially concerning for urinary tract infection, however, urinary culture was negative #3. Hyperglycemia, hemoglobin A1c was 6.8, prediabetes, patient may benefit from a diabetic education, diabetic diet #4. Hypoxia, was thought to be related to pain medications, repeat the chest x-ray revealed a basal atelectasis, continue incentive spirometry , patient was weaned down to 1 L of oxygen through nasal cannula, continue weaning to Ra as tolerated.  #5. Acute post hemorrhagic anemia,  status post one unit of packed red blood cell transfusion, hemoglobin level has improved, follow closely as outpatient #6. Hypotension, resolved after transfusion #7. A. fib, RVR, continue Cardizem  #8. Hypernatremia, initiate patient on D5 water solution, follow sodium level later today  Managment plans discussed with the patient, family and they are in agreement.   DRUG ALLERGIES: No Known Allergies  CODE STATUS:     Code Status Orders        Start     Ordered   05/16/16 1439  Do not attempt resuscitation (DNR)  Continuous    Question Answer Comment  In the event of cardiac or respiratory ARREST Do not call a "code blue"   In the event of cardiac or respiratory ARREST Do not perform Intubation, CPR, defibrillation or ACLS   In the event of cardiac or respiratory ARREST Use medication by any route, position, wound care, and other measures to relive pain and suffering. May use oxygen, suction and manual treatment of airway obstruction as needed for comfort.      05/16/16 1438    Code Status History    Date Active Date Inactive Code Status Order ID Comments User Context   03/16/2016 10:36 AM 03/17/2016  5:14 PM DNR [id number]  Auburn Bilberry, MD Inpatient   03/15/2016  1:08 AM 03/15/2016 10:36 AM Full Code  [id number]  Oralia Manisavid Willis, MD Inpatient    Advance Directive  Documentation   Flowsheet Row Most Recent Value  Type of Advance Directive  Healthcare Power of Attorney  Pre-existing out of facility DNR order (yellow form or pink MOST form)  No data  "MOST" Form in Place?  No data      TOTAL TIME TAKING CARE OF THIS PATIENT: 40 minutes.    Katharina CaperVAICKUTE,Mikaelah Trostle M.D on 05/20/2016 at 11:21 AM  Between 7am to 6pm - Pager - [phone]  After 6pm go to [email or web address] - password [email or web address]  [facility]  Office  [phone]  CC: Primary care physician; BABAOFF, Lavada MesiMARC E, MD

## 2016-05-20 NOTE — Progress Notes (Signed)
Physical Therapy Treatment Patient Details Name: Heidi Miller MRN: [id number] DOB: 3/27/Heidi Primer1924 Today's Date: 05/20/2016    History of Present Illness Heidi Miller  is a 82 y.o. female with a known history of Atrial fibrillation on eliquis, recent admission 2 months ago for an acute CVA with minimal right facial deficits, Alzheimer's dementia, depression, hypertension, ischemic cardiomyopathy with EF of 20%, hypothyroidism presents from home secondary to a fall and noted to have left hip intertrochanteric fracture. Patient is s/p LLE ORIF on 05/17/16; She is WBAT    PT Comments    Pt in bed, easily awoke but kept eyes closed though most of session.  Participated in exercises as described below.   Minimal effort with exercises today.  To edge of bed with max a x 2.  Stood x 2.  First attempt with max a x 2 and unable to achieve standing.  On second attempt she was able to get upright but remained with max a x 2 with walker.  Stand pivot transfer to chair without device and positioned to comfort.  Lethargy limiting participation today.   Follow Up Recommendations  SNF     Equipment Recommendations  None recommended by PT    Recommendations for Other Services       Precautions / Restrictions Precautions Precautions: Fall Restrictions Weight Bearing Restrictions: Yes LLE Weight Bearing: Weight bearing as tolerated    Mobility  Bed Mobility Overal bed mobility: Needs Assistance Bed Mobility: Supine to Sit     Supine to sit: Max assist;+2 for physical assistance        Transfers Overall transfer level: Needs assistance Equipment used: None;2 person hand held assist Transfers: Sit to/from BJ'sStand;Stand Pivot Transfers Sit to Stand: Max assist;+2 physical assistance Stand pivot transfers: Max assist;+2 physical assistance       General transfer comment: poor quality and little assistance  Ambulation/Gait             General Gait Details: unable at this  time;   Information systems managertairs            Wheelchair Mobility    Modified Rankin (Stroke Patients Only)       Balance Overall balance assessment: Needs assistance Sitting-balance support: Feet supported Sitting balance-Leahy Scale: Poor Sitting balance - Comments: patient was mod A for sitting edge of bed;  Postural control: Left lateral lean;Posterior lean Standing balance support: Bilateral upper extremity supported Standing balance-Leahy Scale: Zero                      Cognition Arousal/Alertness: Lethargic Behavior During Therapy: WFL for tasks assessed/performed Overall Cognitive Status: History of cognitive impairments - at baseline       Memory: Decreased short-term memory              Exercises Other Exercises Other Exercises: LLE and RLE ankle pumps, heel slides, AB/ADD x 10 AAROM    General Comments        Pertinent Vitals/Pain Pain Assessment: 0-10 Pain Score: 5  Pain Location: hip Pain Intervention(s): Limited activity within patient's tolerance    Home Living                      Prior Function            PT Goals (current goals can now be found in the care plan section) Progress towards PT goals: Not progressing toward goals - comment    Frequency    BID  PT Plan Current plan remains appropriate    Co-evaluation             End of Session Equipment Utilized During Treatment: Gait belt;Oxygen Activity Tolerance: Patient limited by fatigue Patient left: in chair;with call bell/phone within reach;with chair alarm set     Time: [id number] PT Time Calculation (min) (ACUTE ONLY): 23 min  Charges:  $Therapeutic Exercise: 8-22 mins $Therapeutic Activity: 8-22 mins                    G Codes:      Danielle DessSarah Lineth Thielke 05/20/2016, 9:15 AM

## 2016-05-20 NOTE — Discharge Summary (Signed)
[facility] [place] at [facility]   PATIENT NAME: Heidi Miller    MR#:  [id number]  DATE OF BIRTH:  Oct 16, 1922  DATE OF ADMISSION:  05/16/2016 ADMITTING PHYSICIAN: Enid Baasadhika Kalisetti, MD  DATE OF DISCHARGE: No discharge date for patient encounter.  PRIMARY CARE PHYSICIAN: BABAOFF, MARC E, MD     ADMISSION DIAGNOSIS:  Closed left hip fracture, initial encounter (HCC) [S72.002A]  DISCHARGE DIAGNOSIS:  Principal Problem:   Hip fracture (HCC) Active Problems:   Acute renal insufficiency   Prediabetes   Hypoxia   Mild basilar atelectasis of both lungs   Acute posthemorrhagic anemia   H/O transfusion of packed red blood cells   Hypotension   Atrial fibrillation with RVR (HCC)   MRSA carrier   Hypernatremia   Sterile pyuria   SECONDARY DIAGNOSIS:   Past Medical History:  Diagnosis Date  . Atrial fibrillation (HCC)    on eliquis  . CAD (coronary artery disease)    s/p LAD stent  . CHF (congestive heart failure) (HCC)    systolic dysfunction, EF 20%  . CVA (cerebral vascular accident) (HCC)    in Sept 2017 with left internal capsule infarct  . Dementia   . Depression   . GERD (gastroesophageal reflux disease)   . Hypertension   . Ischemic cardiomyopathy   . Thyroid disease     .pro HOSPITAL COURSE:  The patient is 82 year old Caucasian female with past medical history significant for history of A. fib, stroke, right-sided facial weakness, also, dementia, depression, hypertension, ischemic cardiomyopathy, ejection fraction of 20%, hypothyroidism, who presents to the hospital after fall and was noted to have left hip intertrochanteric fracture.  The patient underwent  intramedullay nail placement  05/17/2016 by Dr Joice LoftsPoggi.  Postoperatively the patient was noted to be anemic, hemoglobin level of 7.5, she was transfused 1 unit of packed blood cells after which hemoglobin level improved to 8.9  05/20/2016. Patient was tachycardic, in A. fib, RVR,  rate as high as 130 postoperatively, however, resumed on her usual medications, added amiodarone and her heart rate has improved. Weaned down to 1 L of oxygen through nasal cannula. Chest x-ray revealed bilateral basilar atelectasis. The patient feels comfortable today, denies any significant pain. Her sodium level was found to be elevated at 149 today, now on D5 water solution. Sodium level will be rechecked later today and if normal, the patient will likely be discharged to skilled nursing facility for rehabilitation. Discussion by problem: #1. Comminuted Left intertrochanteric hip fracture, closed,  status post intramedullary nail placement 11th of November 2017 by Dr. Joice LoftsPoggi, the patient had moderate perioperative risks for cardiovascular event, such as age, functional status, cardiomyopathy, atrial fibrillation. Supportive therapy, physical therapy, anticoagulation with eliquis, the patient was evaluated by physical therapist and recommended skilled nursing facility placement for rehabilitation, likely discharge to skilled nursing facility today or tomorrow  #2. Acute renal insufficiency, resolved on  IV fluids , urinalysis revealed pyuria, initially concerning for urinary tract infection, however, urinary culture was negative #3. Hyperglycemia, hemoglobin A1c was 6.8, prediabetes, patient may benefit from a diabetic education, diabetic diet #4. Hypoxia, was thought to be related to pain medications, repeated chest x-ray revealed a basal atelectasis, continue incentive spirometry , patient was weaned down to 1 L of oxygen through nasal cannula, continue weaning to RA as tolerated.  #5. Acute post hemorrhagic anemia,  status post one unit of packed red blood cell transfusion, hemoglobin level has improved, follow closely as outpatient #6. Hypotension,  resolved after transfusion #7. A. fib, RVR, continue Cardizem , metoprolol,  the patient was managed on amiodarone while in the hospital due to hypotension,  may need to be on amiodarone as outpatient as well, if heart rate remains high with Cardizem and metoprolol and patient is hypotensive. #8. Hypernatremia, initiate patient on D5 water solution, follow sodium level later today DISCHARGE CONDITIONS:   Stable  CONSULTS OBTAINED:  Treatment Team:  Christena FlakeJohn J Poggi, MD Alwyn Peawayne D Callwood, MD  DRUG ALLERGIES:  No Known Allergies  DISCHARGE MEDICATIONS:   Current Discharge Medication List    START taking these medications   Details  docusate sodium (COLACE) 100 MG capsule Take 1 capsule (100 mg total) by mouth 2 (two) times daily. Qty: 10 capsule, Refills: 0    feeding supplement, ENSURE ENLIVE, (ENSURE ENLIVE) LIQD Take 237 mLs by mouth 2 (two) times daily between meals. Qty: 237 mL, Refills: 12    mupirocin ointment (BACTROBAN) 2 % Place 1 application into the nose 2 (two) times daily. Qty: 22 g, Refills: 0    oxyCODONE (OXY IR/ROXICODONE) 5 MG immediate release tablet Take 1 tablet (5 mg total) by mouth every 4 (four) hours as needed for moderate pain or breakthrough pain. Qty: 30 tablet, Refills: 0    polyethylene glycol (MIRALAX / GLYCOLAX) packet Take 17 g by mouth daily as needed for mild constipation. Qty: 14 each, Refills: 0    senna (SENOKOT) 8.6 MG TABS tablet Take 1 tablet (8.6 mg total) by mouth 2 (two) times daily. Qty: 120 each, Refills: 0      CONTINUE these medications which have NOT CHANGED   Details  apixaban (ELIQUIS) 2.5 MG TABS tablet Take 2.5 mg by mouth 2 (two) times daily.    aspirin 81 MG chewable tablet Chew 81 mg by mouth every morning.    cyanocobalamin (,VITAMIN B-12,) 1000 MCG/ML injection Inject 1,000 mcg into the muscle every 30 (thirty) days. On the 16th    diltiazem (CARDIZEM CD) 120 MG 24 hr capsule Take 120 mg by mouth daily.    furosemide (LASIX) 20 MG tablet Take 20 mg by mouth daily.    levothyroxine (SYNTHROID, LEVOTHROID) 100 MCG tablet Take 100 mcg by mouth daily.    memantine  (NAMENDA) 10 MG tablet Take 10 mg by mouth 2 (two) times daily.    metoprolol (LOPRESSOR) 50 MG tablet Take 25 mg by mouth daily.    mirtazapine (REMERON) 45 MG tablet Take 22.5 mg by mouth at bedtime.    omeprazole (PRILOSEC) 20 MG capsule Take 20 mg by mouth daily.    QUEtiapine (SEROQUEL XR) 50 MG TB24 24 hr tablet Take 50 mg by mouth every evening.    rivastigmine (EXELON) 1.5 MG capsule Take 1.5 mg by mouth 2 (two) times daily with a meal. 1200 & 1700    spironolactone (ALDACTONE) 25 MG tablet Take 12.5 mg by mouth daily.    venlafaxine XR (EFFEXOR-XR) 150 MG 24 hr capsule Take 150 mg by mouth daily.         DISCHARGE INSTRUCTIONS:    The patient is to follow-up with primary care physician as outpatient, orthopedist surgeon  If you experience worsening of your admission symptoms, develop shortness of breath, life threatening emergency, suicidal or homicidal thoughts you must seek medical attention immediately by calling 911 or calling your MD immediately  if symptoms less severe.  You Must read complete instructions/literature along with all the possible adverse reactions/side effects for all the Medicines you  take and that have been prescribed to you. Take any new Medicines after you have completely understood and accept all the possible adverse reactions/side effects.   Please note  You were cared for by a hospitalist during your hospital stay. If you have any questions about your discharge medications or the care you received while you were in the hospital after you are discharged, you can call the unit and asked to speak with the hospitalist on call if the hospitalist that took care of you is not available. Once you are discharged, your primary care physician will handle any further medical issues. Please note that NO REFILLS for any discharge medications will be authorized once you are discharged, as it is imperative that you return to your primary care physician (or  establish a relationship with a primary care physician if you do not have one) for your aftercare needs so that they can reassess your need for medications and monitor your lab values.    Today   CHIEF COMPLAINT:   Chief Complaint  Patient presents with  . Hip Pain  . Fall    HISTORY OF PRESENT ILLNESS:  Heidi Miller  is a 82 y.o. female with a known history of A. fib, stroke, right-sided facial weakness, also, dementia, depression, hypertension, ischemic cardiomyopathy, ejection fraction of 20%, hypothyroidism, who presents to the hospital after fall and was noted to have left hip intertrochanteric fracture.  The patient underwent  intramedullay nail placement  05/17/2016 by Dr Joice Lofts.  Postoperatively the patient was noted to be anemic, hemoglobin level of 7.5, she was transfused 1 unit of packed blood cells after which hemoglobin level improved to 8.9  05/20/2016. Patient was tachycardic, in A. fib, RVR, rate as high as 130 postoperatively, however, resumed on her usual medications, added amiodarone and her heart rate has improved. Weaned down to 1 L of oxygen through nasal cannula. Chest x-ray revealed bilateral basilar atelectasis. The patient feels comfortable today, denies any significant pain. Her sodium level was found to be elevated at 149 today, now on D5 water solution. Sodium level will be rechecked later today and if normal, the patient will likely be discharged to skilled nursing facility for rehabilitation. Discussion by problem: #1. Comminuted Left intertrochanteric hip fracture, closed,  status post intramedullary nail placement 11th of November 2017 by Dr. Joice Lofts, the patient had moderate perioperative risks for cardiovascular event, such as age, functional status, cardiomyopathy, atrial fibrillation. Supportive therapy, physical therapy, anticoagulation with eliquis, the patient was evaluated by physical therapist and recommended skilled nursing facility placement for  rehabilitation, likely discharge to skilled nursing facility today or tomorrow  #2. Acute renal insufficiency, resolved on  IV fluids , urinalysis revealed pyuria, initially concerning for urinary tract infection, however, urinary culture was negative #3. Hyperglycemia, hemoglobin A1c was 6.8, prediabetes, patient may benefit from a diabetic education, diabetic diet #4. Hypoxia, was thought to be related to pain medications, repeated chest x-ray revealed a basal atelectasis, continue incentive spirometry , patient was weaned down to 1 L of oxygen through nasal cannula, continue weaning to RA as tolerated.  #5. Acute post hemorrhagic anemia,  status post one unit of packed red blood cell transfusion, hemoglobin level has improved, follow closely as outpatient #6. Hypotension, resolved after transfusion #7. A. fib, RVR, continue Cardizem , metoprolol,  the patient was managed on amiodarone while in the hospital due to hypotension, may need to be on amiodarone as outpatient as well, if heart rate remains high with Cardizem and metoprolol  and patient is hypotensive. #8. Hypernatremia, initiate patient on D5 water solution, follow sodium level later today    VITAL SIGNS:  Blood pressure (!) 117/94, pulse (!) 58, temperature 98.6 F (37 C), temperature source Oral, resp. rate 20, height 5\' 3"  (1.6 m), weight 64.9 kg (143 lb), SpO2 98 %.  I/O:   Intake/Output Summary (Last 24 hours) at 05/20/16 1133 Last data filed at 05/20/16 0918  Gross per 24 hour  Intake              240 ml  Output                0 ml  Net              240 ml    PHYSICAL EXAMINATION:  GENERAL:  82 y.o.-year-old patient lying in the bed with no acute distress.  EYES: Pupils equal, round, reactive to light and accommodation. No scleral icterus. Extraocular muscles intact.  HEENT: Head atraumatic, normocephalic. Oropharynx and nasopharynx clear.  NECK:  Supple, no jugular venous distention. No thyroid enlargement, no  tenderness.  LUNGS: Normal breath sounds bilaterally, no wheezing, rales,rhonchi or crepitation. No use of accessory muscles of respiration.  CARDIOVASCULAR: S1, S2 normal. No murmurs, rubs, or gallops.  ABDOMEN: Soft, non-tender, non-distended. Bowel sounds present. No organomegaly or mass.  EXTREMITIES: No pedal edema, cyanosis, or clubbing.  NEUROLOGIC: Cranial nerves II through XII are intact. Muscle strength 5/5 in all extremities. Sensation intact. Gait not checked.  PSYCHIATRIC: The patient is alert and oriented x 3.  SKIN: No obvious rash, lesion, or ulcer.   DATA REVIEW:   CBC  Recent Labs Lab 05/20/16 0421  WBC 9.3  HGB 8.9*  HCT 28.2*  PLT 177    Chemistries   Recent Labs Lab 05/20/16 0421  NA 149*  K 4.4  CL 115*  CO2 29  GLUCOSE 159*  BUN 46*  CREATININE 0.95  CALCIUM 8.9    Cardiac Enzymes No results for input(s): TROPONINI in the last 168 hours.  Microbiology Results  Results for orders placed or performed during the hospital encounter of 05/16/16  MRSA PCR Screening     Status: Abnormal   Collection Time: 05/16/16  2:53 PM  Result Value Ref Range Status   MRSA by PCR POSITIVE (A) NEGATIVE Final    Comment:        The GeneXpert MRSA Assay (FDA approved for NASAL specimens only), is one component of a comprehensive MRSA colonization surveillance program. It is not intended to diagnose MRSA infection nor to guide or monitor treatment for MRSA infections. RESULT CALLED TO, READ BACK BY AND VERIFIED WITH: Harvie Heck @ 1610 05/16/16 by [facility]   Urine culture     Status: None   Collection Time: 05/18/16  5:40 AM  Result Value Ref Range Status   Specimen Description URINE, CATHETERIZED  Final   Special Requests NONE  Final   Culture NO GROWTH Performed at [facility]   Final   Report Status 05/19/2016 FINAL  Final    RADIOLOGY:  No results found.  EKG:   Orders placed or performed during the hospital encounter of 03/14/16  .  ED EKG  . ED EKG  . EKG 12-Lead  . EKG 12-Lead      Management plans discussed with the patient, family and they are in agreement.  CODE STATUS:     Code Status Orders        Start  Ordered   05/16/16 1439  Do not attempt resuscitation (DNR)  Continuous    Question Answer Comment  In the event of cardiac or respiratory ARREST Do not call a "code blue"   In the event of cardiac or respiratory ARREST Do not perform Intubation, CPR, defibrillation or ACLS   In the event of cardiac or respiratory ARREST Use medication by any route, position, wound care, and other measures to relive pain and suffering. May use oxygen, suction and manual treatment of airway obstruction as needed for comfort.      05/16/16 1438    Code Status History    Date Active Date Inactive Code Status Order ID Comments User Context   03/16/2016 10:36 AM 03/17/2016  5:14 PM DNR [id number]  Auburn Bilberry, MD Inpatient   03/15/2016  1:08 AM 03/15/2016 10:36 AM Full Code [id number]  Oralia Manis, MD Inpatient    Advance Directive Documentation   Flowsheet Row Most Recent Value  Type of Advance Directive  Healthcare Power of Attorney  Pre-existing out of facility DNR order (yellow form or pink MOST form)  No data  "MOST" Form in Place?  No data      TOTAL TIME TAKING CARE OF THIS PATIENT: 40 minutes.    Katharina Caper M.D on 05/20/2016 at 11:33 AM  Between 7am to 6pm - Pager - [phone]  After 6pm go to [email or web address] - password [email or web address] [facility]  [facility]  Office  [phone]  CC: Primary care physician; BABAOFF, Lavada Mesi, MD

## 2016-05-20 NOTE — Progress Notes (Signed)
RN called to give report to [employer] Blackwell. EMS called for transport. Patient is in the bed. RN assess pain with PAINAD and gave the patient oxy 5mg . Patient is experiencing pain in her left hip with movement. Patient lung sounds are clear and diminished on assessment with a productive cough. Patient has been coughing  Up yellow mucous for a few days now. No evidence of SOB or distress. Patient HR range from 90-120 on tele. MD aware.   Harvie HeckMelanie Maddon Horton, RN

## 2016-05-20 NOTE — Progress Notes (Signed)
Subjective: 3 Days Post-Op Procedure(s) (LRB): INTRAMEDULLARY (IM) NAIL INTERTROCHANTRIC (Left) Patient reports pain as mild .   Patient is well, and has had no acute complaints or problems.  Denies any CP, SOB, ABD pain.  We will continue therapy today.  Plan is to go Skilled nursing facility after hospital stay.  Objective: Vital signs in last 24 hours: Temp:  [97.7 F (36.5 C)-98.6 F (37 C)] 98.6 F (37 C) (11/14 0700) Pulse Rate:  [58-113] 58 (11/14 0700) Resp:  [18-20] 20 (11/14 0700) BP: (88-121)/(50-94) 117/94 (11/14 0700) SpO2:  [96 %-98 %] 98 % (11/14 0700)  Intake/Output from previous day: No intake/output data recorded. Intake/Output this shift: No intake/output data recorded.   Recent Labs  05/18/16 0345 05/18/16 1457 05/19/16 0402 05/20/16 0421  HGB 7.5* 9.8* 8.5* 8.9*    Recent Labs  05/19/16 0402 05/20/16 0421  WBC 8.9 9.3  RBC 3.28* 3.43*  HCT 27.0* 28.2*  PLT 149* 177    Recent Labs  05/19/16 0402 05/20/16 0421  NA 144 149*  K 4.3 4.4  CL 113* 115*  CO2 27 29  BUN 36* 46*  CREATININE 1.01* 0.95  GLUCOSE 182* 159*  CALCIUM 8.5* 8.9   No results for input(s): LABPT, INR in the last 72 hours.  EXAM General - Patient is Alert, Appropriate and Oriented, drowsy this AM during exam. Extremity - Neurovascular intact Sensation intact distally Intact pulses distally Dorsiflexion/Plantar flexion intact No cellulitis present Compartment soft Dressing - dressing C/D/I and no drainage Motor Function - intact, moving foot and toes well on exam.   Past Medical History:  Diagnosis Date  . Atrial fibrillation (HCC)    on eliquis  . CAD (coronary artery disease)    s/p LAD stent  . CHF (congestive heart failure) (HCC)    systolic dysfunction, EF 20%  . CVA (cerebral vascular accident) (HCC)    in Sept 2017 with left internal capsule infarct  . Dementia   . Depression   . GERD (gastroesophageal reflux disease)   . Hypertension   .  Ischemic cardiomyopathy   . Thyroid disease     Assessment/Plan:   3 Days Post-Op Procedure(s) (LRB): INTRAMEDULLARY (IM) NAIL INTERTROCHANTRIC (Left) Active Problems:   Hip fracture (HCC)  Estimated body mass index is 25.33 kg/m as calculated from the following:   Height as of this encounter: 5\' 3"  (1.6 m).   Weight as of this encounter: 64.9 kg (143 lb). Advance diet Up with therapy   Reports that she has had a BM however none recorded, can more to FLEET enema if needed today. Acute post op blood loss anemia with underlying chronic anemia: Status post one unit of PRBC, hemoglobin stable at 8.9. Cr down to 0.95 today. Care management to assist with discharge.  Continue Eliquis as prior to admission. Staples can be removed by nursing facility on 05/30/16. [facility] in 4-6 weeks with either Horris LatinoLance Esperansa Sarabia, PA-C or Dr. Joice LoftsPoggi.  DVT Prophylaxis - Foot Pumps, TED hose and Eliquis Weight-Bearing as tolerated to left leg  J. Horris LatinoLance Garwood Wentzell, PA-C [facility] 05/20/2016, 10:04 AM

## 2016-05-20 NOTE — Clinical Social Work Placement (Signed)
   CLINICAL SOCIAL WORK PLACEMENT  NOTE  Date:  05/20/2016  Patient Details  Name: Heidi Miller MRN: [id number] Date of Birth: 1923/02/16  Clinical Social Work is seeking post-discharge placement for this patient at the Skilled  Nursing Facility level of care (*CSW will initial, date and re-position this form in  chart as items are completed):  Yes   Patient/family provided with [facility] Clinical Social Work Department's list of facilities offering this level of care within the geographic area requested by the patient (or if unable, by the patient's family).  Yes   Patient/family informed of their freedom to choose among providers that offer the needed level of care, that participate in Medicare, Medicaid or managed care program needed by the patient, have an available bed and are willing to accept the patient.  Yes   Patient/family informed of [facility]'s ownership interest in [facility] and [facility], as well as of the fact that they are under no obligation to receive care at these facilities.  PASRR submitted to EDS on       PASRR number received on       Existing PASRR number confirmed on 05/18/16     FL2 transmitted to all facilities in geographic area requested by pt/family on 05/18/16     FL2 transmitted to all facilities within larger geographic area on       Patient informed that his/her managed care company has contracts with or will negotiate with certain facilities, including the following:        Yes   Patient/family informed of bed offers received.  Patient chooses bed at  ([facility] )     Physician recommends and patient chooses bed at      Patient to be transferred to  ([facility] ) on 05/20/16.  Patient to be transferred to facility by  [facility]     Patient family notified on 05/20/16 of transfer.  Name of family member notified:   (CSW left patient's daughter [name] a voicemail making her aware of D/C today. CSW also contacted  patient's niece Heidi Miller and made her aware of D/C today. )     PHYSICIAN       Additional Comment:    _______________________________________________ Alano Blasco, Darleen CrockerBailey M, LCSW 05/20/2016, 12:24 PM

## 2016-05-20 NOTE — Progress Notes (Signed)
[employer] authorization has been received. Auth # [id number]. [name] liaison is aware of above. Plan is for patient to D/C to [facility] when stable.   Baker Hughes IncorporatedBailey Askari Kinley, LCSW [phone]

## 2016-05-20 NOTE — Progress Notes (Signed)
Speech Language Pathology Treatment: Dysphagia  Patient Details Name: Heidi PrimerMavis W Stribling MRN: [id number] DOB: [birth year] Today's Date: 05/20/2016 Time: 1020-1100 SLP Time Calculation (min) (ACUTE ONLY): 40 min  Assessment / Plan / Recommendation Clinical Impression  Pt seen for toleration of diet and appropriateness for diet consistency upgrade this morning. Pt appeared to adequately tolerate trials of thin liquids following general aspiration precautions w/ SLP; no overt s/s of aspiration noted during/post po trials. Pt did require mod cues during oral intake for follow through; she continues to present w/ drowsiness and overall weakness. Pt appears at minimal risk for aspiration due to being a dependent feeder at this time and need for moderate verbal and tacticle cues for follow through. During trials, no decline in vocal quality or respiratory status w/ po intake noted. Oral phase grossly wfl for bolus management and clearing. Again today, solids were not tested at this time due to pt's overall fatigue and drowsiness; weakness. Discussed w/ pt, Dietician, and NSG that pt will have to exert increased effort d/t the effort of mastication of solids vs puree foods. Pt is at increased risk for choking on a solid if not fully masticated as well as decreased oral/nutritional intake d/t fatigue from exertion. NSG stated she ate "well" this morning w/ staff member and is tolerating liquids including Ensure. Pt will require full feeding assistance, tray set up, and full monitoring during meals at this time. Pt does have baseline of Cognitive decline/Dementia and would benefit from following aspiration precautions as well as having full monitoring during meals to ensure safety w/ oral intake. Recommend a Dysphagia 1 diet with thin liquids for easier mastication at this time. ST services will f/u w/ toleration of diet and monitor/assess for appropriate time to upgrade diet consistency once transitioned to Rehab for  therapy. NSG agreed.   HPI HPI: Pt is a 82 y.o. female with a known history of Atrial fibrillation on eliquis, recent admission 2 months ago for an acute CVA with minimal right facial deficits, Alzheimer's dementia, depression, hypertension, ischemic cardiomyopathy with EF of 20%, hypothyroidism presents from home secondary to a fall and noted to have left hip intertrochanteric fracture. NSG reported pt has tolerated her current Pureed diet w/ thin liquids well w/ no s/s of aspiration during intake noted; also tolerating meds in puree. Pt remains slightly drowsy per NSG.       SLP Plan  Continue with current plan of care     Recommendations  Diet recommendations: Dysphagia 1 (puree);Thin liquid Liquids provided via: Cup;Straw Medication Administration: Whole meds with puree (or crushed as needed for easier swallowing) Supervision: Staff to assist with self feeding;Full supervision/cueing for compensatory strategies Compensations: Minimize environmental distractions;Slow rate;Small sips/bites;Lingual sweep for clearance of pocketing;Follow solids with liquid Postural Changes and/or Swallow Maneuvers: Seated upright 90 degrees;Upright 30-60 min after meal                General recommendations:  (Dietician f/u) Oral Care Recommendations: Oral care BID;Staff/trained caregiver to provide oral care Follow up Recommendations: Skilled Nursing facility (for diet consistency upgrade as strength improves) Plan: Continue with current plan of care       GO                Jerilynn SomKatherine Silus Lanzo, MS, CCC-SLP Danyal Whitenack 05/20/2016, 2:27 PM

## 2016-05-20 NOTE — Discharge Instructions (Signed)
Staples can be removed by Nursing facility on 05/30/16.

## 2016-05-20 NOTE — Care Management Important Message (Signed)
Important Message  Patient Details  Name: Heidi Miller MRN: [id number] Date of Birth: 13-Feb-1923   Medicare Important Message Given:  Yes    Marily MemosLisa M Sabatino Williard, RN 05/20/2016, 12:03 PM

## 2016-05-20 NOTE — Progress Notes (Signed)
Patient is medically stable for discharge to [facility]. Per, [facility] admissions coordinator at [facility], patient can come to room 206. Clinical Education officer, museum (CSW) sent discharge orders in [place]. Social work Theatre manager met with patient at bedside and explained that patient will discharge today to [facility]. Patient verbally agreed she understood. CSW received  Health Team authorization, [phone]. Social work intern left message with patient's daughter Leda Gauze making her aware of patient's discharge today. CSW spoke with patient's niece Pamala Hurry, making her aware of patient's discharge. RN will call report and arrange EMS for transport. Please reconsult if further social work needs arise. Social work Architectural technologist off.   Danie Chandler, Social Work Intern  [phone]

## 2016-06-01 ENCOUNTER — Inpatient Hospital Stay
Admission: EM | Admit: 2016-06-01 | Discharge: 2016-06-05 | DRG: 378 | Disposition: A | Discharge status: Hospice | Payer: PPO | Attending: Internal Medicine | Admitting: Internal Medicine

## 2016-06-01 ENCOUNTER — Encounter: Payer: Self-pay | Admitting: Emergency Medicine

## 2016-06-01 DIAGNOSIS — I2510 Atherosclerotic heart disease of native coronary artery without angina pectoris: Secondary | ICD-10-CM | POA: Diagnosis present

## 2016-06-01 DIAGNOSIS — I959 Hypotension, unspecified: Secondary | ICD-10-CM | POA: Diagnosis not present

## 2016-06-01 DIAGNOSIS — T45515A Adverse effect of anticoagulants, initial encounter: Secondary | ICD-10-CM | POA: Diagnosis present

## 2016-06-01 DIAGNOSIS — F0281 Dementia in other diseases classified elsewhere with behavioral disturbance: Secondary | ICD-10-CM | POA: Diagnosis present

## 2016-06-01 DIAGNOSIS — I248 Other forms of acute ischemic heart disease: Secondary | ICD-10-CM | POA: Diagnosis present

## 2016-06-01 DIAGNOSIS — Z515 Encounter for palliative care: Secondary | ICD-10-CM | POA: Diagnosis not present

## 2016-06-01 DIAGNOSIS — Z66 Do not resuscitate: Secondary | ICD-10-CM | POA: Diagnosis present

## 2016-06-01 DIAGNOSIS — I110 Hypertensive heart disease with heart failure: Secondary | ICD-10-CM | POA: Diagnosis present

## 2016-06-01 DIAGNOSIS — E876 Hypokalemia: Secondary | ICD-10-CM | POA: Diagnosis not present

## 2016-06-01 DIAGNOSIS — K219 Gastro-esophageal reflux disease without esophagitis: Secondary | ICD-10-CM | POA: Diagnosis present

## 2016-06-01 DIAGNOSIS — I4891 Unspecified atrial fibrillation: Secondary | ICD-10-CM | POA: Diagnosis present

## 2016-06-01 DIAGNOSIS — E039 Hypothyroidism, unspecified: Secondary | ICD-10-CM | POA: Diagnosis present

## 2016-06-01 DIAGNOSIS — E870 Hyperosmolality and hypernatremia: Secondary | ICD-10-CM

## 2016-06-01 DIAGNOSIS — Z7982 Long term (current) use of aspirin: Secondary | ICD-10-CM | POA: Diagnosis not present

## 2016-06-01 DIAGNOSIS — R451 Restlessness and agitation: Secondary | ICD-10-CM | POA: Diagnosis not present

## 2016-06-01 DIAGNOSIS — Z8673 Personal history of transient ischemic attack (TIA), and cerebral infarction without residual deficits: Secondary | ICD-10-CM | POA: Diagnosis not present

## 2016-06-01 DIAGNOSIS — I5022 Chronic systolic (congestive) heart failure: Secondary | ICD-10-CM | POA: Diagnosis present

## 2016-06-01 DIAGNOSIS — R52 Pain, unspecified: Secondary | ICD-10-CM | POA: Diagnosis not present

## 2016-06-01 DIAGNOSIS — Z6824 Body mass index (BMI) 24.0-24.9, adult: Secondary | ICD-10-CM

## 2016-06-01 DIAGNOSIS — F329 Major depressive disorder, single episode, unspecified: Secondary | ICD-10-CM | POA: Diagnosis present

## 2016-06-01 DIAGNOSIS — Z8249 Family history of ischemic heart disease and other diseases of the circulatory system: Secondary | ICD-10-CM | POA: Diagnosis not present

## 2016-06-01 DIAGNOSIS — I255 Ischemic cardiomyopathy: Secondary | ICD-10-CM | POA: Diagnosis present

## 2016-06-01 DIAGNOSIS — N179 Acute kidney failure, unspecified: Secondary | ICD-10-CM | POA: Diagnosis present

## 2016-06-01 DIAGNOSIS — Z801 Family history of malignant neoplasm of trachea, bronchus and lung: Secondary | ICD-10-CM | POA: Diagnosis not present

## 2016-06-01 DIAGNOSIS — K922 Gastrointestinal hemorrhage, unspecified: Secondary | ICD-10-CM | POA: Diagnosis present

## 2016-06-01 DIAGNOSIS — I272 Pulmonary hypertension, unspecified: Secondary | ICD-10-CM | POA: Diagnosis present

## 2016-06-01 DIAGNOSIS — Z955 Presence of coronary angioplasty implant and graft: Secondary | ICD-10-CM | POA: Diagnosis not present

## 2016-06-01 DIAGNOSIS — Z7901 Long term (current) use of anticoagulants: Secondary | ICD-10-CM | POA: Diagnosis not present

## 2016-06-01 DIAGNOSIS — Z9049 Acquired absence of other specified parts of digestive tract: Secondary | ICD-10-CM | POA: Diagnosis not present

## 2016-06-01 DIAGNOSIS — R627 Adult failure to thrive: Secondary | ICD-10-CM | POA: Diagnosis present

## 2016-06-01 DIAGNOSIS — R1310 Dysphagia, unspecified: Secondary | ICD-10-CM | POA: Diagnosis present

## 2016-06-01 LAB — COMPREHENSIVE METABOLIC PANEL
ALK PHOS: 167 U/L — AB (ref 38–126)
ALT: 23 U/L (ref 14–54)
ANION GAP: 9 (ref 5–15)
AST: 26 U/L (ref 15–41)
Albumin: 3.1 g/dL — ABNORMAL LOW (ref 3.5–5.0)
BUN: 42 mg/dL — ABNORMAL HIGH (ref 6–20)
CALCIUM: 8.7 mg/dL — AB (ref 8.9–10.3)
CHLORIDE: 117 mmol/L — AB (ref 101–111)
CO2: 26 mmol/L (ref 22–32)
CREATININE: 1.2 mg/dL — AB (ref 0.44–1.00)
GFR, EST AFRICAN AMERICAN: 44 mL/min — AB (ref >60)
GFR, EST NON AFRICAN AMERICAN: 38 mL/min — AB (ref >60)
Glucose, Bld: 138 mg/dL — ABNORMAL HIGH (ref 65–99)
Potassium: 3.2 mmol/L — ABNORMAL LOW (ref 3.5–5.1)
SODIUM: 152 mmol/L — AB (ref 135–145)
Total Bilirubin: 1.2 mg/dL (ref 0.3–1.2)
Total Protein: 6.4 g/dL — ABNORMAL LOW (ref 6.5–8.1)

## 2016-06-01 LAB — CBC
HCT: 41 % (ref 35.0–47.0)
Hemoglobin: 12.4 g/dL (ref 12.0–16.0)
MCH: 28 pg (ref 26.0–34.0)
MCHC: 30.3 g/dL — AB (ref 32.0–36.0)
MCV: 92.2 fL (ref 80.0–100.0)
PLATELETS: 322 10*3/uL (ref 150–440)
RBC: 4.45 MIL/uL (ref 3.80–5.20)
RDW: 25.1 % — ABNORMAL HIGH (ref 11.5–14.5)
WBC: 7.1 10*3/uL (ref 3.6–11.0)

## 2016-06-01 LAB — TROPONIN I: TROPONIN I: 0.06 ng/mL — AB (ref <0.03)

## 2016-06-01 LAB — PHOSPHORUS: Phosphorus: 3.5 mg/dL (ref 2.5–4.6)

## 2016-06-01 LAB — MAGNESIUM: Magnesium: 2.2 mg/dL (ref 1.7–2.4)

## 2016-06-01 MED ORDER — RIVASTIGMINE TARTRATE 1.5 MG PO CAPS
1.5000 mg | ORAL_CAPSULE | Freq: Two times a day (BID) | ORAL | Status: DC
  Filled 2016-06-01 (×5): qty 1

## 2016-06-01 MED ORDER — SENNOSIDES-DOCUSATE SODIUM 8.6-50 MG PO TABS: 1.0000 | ORAL_TABLET | Freq: Every evening | ORAL | Status: DC | PRN

## 2016-06-01 MED ORDER — LEVOTHYROXINE SODIUM 100 MCG PO TABS
100.0000 ug | ORAL_TABLET | Freq: Every day | ORAL | Status: DC
  Administered 2016-06-03: 100 ug via ORAL
  Filled 2016-06-01 (×2): qty 2
  Filled 2016-06-01: qty 1

## 2016-06-01 MED ORDER — METOPROLOL TARTRATE 25 MG PO TABS
25.0000 mg | ORAL_TABLET | Freq: Every day | ORAL | Status: DC
  Administered 2016-06-02: 25 mg via ORAL
  Filled 2016-06-01: qty 1

## 2016-06-01 MED ORDER — PANTOPRAZOLE SODIUM 40 MG IV SOLR
40.0000 mg | Freq: Two times a day (BID) | INTRAVENOUS | Status: DC
  Administered 2016-06-01 – 2016-06-04 (×5): 40 mg via INTRAVENOUS
  Filled 2016-06-01 (×5): qty 40

## 2016-06-01 MED ORDER — ACETAMINOPHEN 325 MG PO TABS
650.0000 mg | ORAL_TABLET | Freq: Four times a day (QID) | ORAL | Status: DC | PRN
  Administered 2016-06-02: 650 mg via ORAL
  Filled 2016-06-01: qty 2

## 2016-06-01 MED ORDER — ACETAMINOPHEN 650 MG RE SUPP: 650.0000 mg | Freq: Four times a day (QID) | RECTAL | Status: DC | PRN

## 2016-06-01 MED ORDER — ZOLPIDEM TARTRATE 5 MG PO TABS: 5.0000 mg | ORAL_TABLET | Freq: Every evening | ORAL | Status: DC | PRN

## 2016-06-01 MED ORDER — VENLAFAXINE HCL ER 75 MG PO CP24
150.0000 mg | ORAL_CAPSULE | Freq: Every day | ORAL | Status: DC
  Filled 2016-06-01: qty 2

## 2016-06-01 MED ORDER — KCL IN DEXTROSE-NACL 20-5-0.45 MEQ/L-%-% IV SOLN
INTRAVENOUS | Status: DC
  Administered 2016-06-02 – 2016-06-04 (×2): via INTRAVENOUS
  Filled 2016-06-01 (×8): qty 1000

## 2016-06-01 MED ORDER — MIRTAZAPINE 15 MG PO TABS
22.5000 mg | ORAL_TABLET | Freq: Every day | ORAL | Status: DC
  Administered 2016-06-02 – 2016-06-03 (×2): 22.5 mg via ORAL
  Filled 2016-06-01 (×2): qty 2

## 2016-06-01 MED ORDER — SODIUM CHLORIDE 0.9% FLUSH
3.0000 mL | Freq: Two times a day (BID) | INTRAVENOUS | Status: DC
  Administered 2016-06-02 – 2016-06-03 (×3): 3 mL via INTRAVENOUS

## 2016-06-01 MED ORDER — MUPIROCIN 2 % EX OINT
1.0000 "application " | TOPICAL_OINTMENT | Freq: Two times a day (BID) | CUTANEOUS | Status: DC
  Administered 2016-06-02: 1 via NASAL
  Filled 2016-06-01: qty 22

## 2016-06-01 MED ORDER — MAGNESIUM CITRATE PO SOLN
1.0000 | Freq: Once | ORAL | Status: DC | PRN
Start: 2016-06-01 — End: 2016-06-04
  Filled 2016-06-01: qty 296

## 2016-06-01 MED ORDER — BISACODYL 5 MG PO TBEC: 5.0000 mg | DELAYED_RELEASE_TABLET | Freq: Every day | ORAL | Status: DC | PRN

## 2016-06-01 MED ORDER — QUETIAPINE FUMARATE ER 50 MG PO TB24: 25.0000 mg | ORAL_TABLET | Freq: Every evening | ORAL | Status: DC

## 2016-06-01 MED ORDER — MEMANTINE HCL 5 MG PO TABS
10.0000 mg | ORAL_TABLET | Freq: Two times a day (BID) | ORAL | Status: DC
  Administered 2016-06-02 – 2016-06-03 (×2): 10 mg via ORAL
  Filled 2016-06-01 (×3): qty 2

## 2016-06-01 MED ORDER — DILTIAZEM HCL ER COATED BEADS 120 MG PO CP24
120.0000 mg | ORAL_CAPSULE | Freq: Every day | ORAL | Status: DC
  Administered 2016-06-02: 120 mg via ORAL
  Filled 2016-06-01: qty 1

## 2016-06-01 MED ORDER — ONDANSETRON HCL 4 MG PO TABS: 4.0000 mg | ORAL_TABLET | Freq: Four times a day (QID) | ORAL | Status: DC | PRN

## 2016-06-01 MED ORDER — PANTOPRAZOLE SODIUM 40 MG PO TBEC: 40.0000 mg | DELAYED_RELEASE_TABLET | Freq: Every day | ORAL | Status: DC

## 2016-06-01 MED ORDER — ONDANSETRON HCL 4 MG/2ML IJ SOLN: 4.0000 mg | Freq: Four times a day (QID) | INTRAMUSCULAR | Status: DC | PRN

## 2016-06-01 MED ORDER — HYDROCODONE-ACETAMINOPHEN 5-325 MG PO TABS: 1.0000 | ORAL_TABLET | ORAL | Status: DC | PRN

## 2016-06-01 MED ORDER — FUROSEMIDE 20 MG PO TABS: 20.0000 mg | ORAL_TABLET | Freq: Every day | ORAL | Status: DC

## 2016-06-01 MED ORDER — SPIRONOLACTONE 25 MG PO TABS: 12.5000 mg | ORAL_TABLET | Freq: Every day | ORAL | Status: DC

## 2016-06-01 NOTE — H&P (Signed)
[facility] [place] [facility] Admission History and Physical Heidi RoyaltyAlexis Rene Miller, D.O.  ---------------------------------------------------------------------------------------------------------------------   PATIENT NAME: Heidi RamMavis Miller MR#: [id number] DATE OF BIRTH: 09-May-1923 DATE OF ADMISSION: 06/01/2016 PRIMARY CARE PHYSICIAN: BABAOFF, Heidi MesiMARC E, MD  REQUESTING/REFERRING PHYSICIAN: ED Dr. Cyril Miller  CHIEF COMPLAINT: Chief Complaint  Patient presents with  . GI Bleeding    HISTORY OF PRESENT ILLNESS: Please note that the patient's history of present illness is obtained from the patient's daughter, chart and emergency department physician as the history is limited by dementia.  Heidi Miller is a 82 y.o. female with a known history of Atrial fibrillation on Eliquis, coronary artery disease with LAD stents, CHF systolic dysfunction with an EF 20%, CVA, hypertension, ischemic cardiomyopathy, dementia and GERD presents to the emergency department for evaluation of rectal bleeding. Patient's daughter states that she was at her assisted living facility this afternoon when she was being change the caregivers noticed bright red blood in her diaper. The patient's daughter states that she has had some increased lethargy and a decreased appetite over the last several days. At baseline she is confused but somewhat conversive. Patient's daughter does not know when her last colonoscopy was but states she has not had any history of rectal bleeding in the past.  Apparently she was ambulatory until 2 weeks ago when she fell and broke her hip needing surgery. He is doing rehabilitation at [facility] where she has gradually declined.  Otherwise there has been no change in status. Patient has been taking medication as prescribed and there has been no recent change in medication or diet.  There has been no recent illness, travel or sick contacts.    PAST MEDICAL HISTORY: Past Medical History:  Diagnosis  Date  . Atrial fibrillation (HCC)    on eliquis  . CAD (coronary artery disease)    s/p LAD stent  . CHF (congestive heart failure) (HCC)    systolic dysfunction, EF 20%  . CVA (cerebral vascular accident) (HCC)    in Sept 2017 with left internal capsule infarct  . Dementia   . Depression   . GERD (gastroesophageal reflux disease)   . Hypertension   . Ischemic cardiomyopathy   . Thyroid disease       PAST SURGICAL HISTORY: Past Surgical History:  Procedure Laterality Date  . CARDIAC CATHETERIZATION    . CHOLECYSTECTOMY    . FOOT SURGERY    . HERNIA REPAIR    . INTRAMEDULLARY (IM) NAIL INTERTROCHANTERIC Left 05/17/2016   Procedure: INTRAMEDULLARY (IM) NAIL INTERTROCHANTRIC;  Surgeon: Heidi FlakeJohn J Poggi, MD;  Location: [facility] ORS;  Service: Orthopedics;  Laterality: Left;  . resection of left acoustic neuroma    . Right rotator cuff repair    . TONSILLECTOMY        SOCIAL HISTORY: Social History  Substance Use Topics  . Smoking status: Never Smoker  . Smokeless tobacco: Never Used  . Alcohol use No      FAMILY HISTORY: Family History  Problem Relation Age of Onset  . Lung cancer Mother   . Heart attack Father   . CAD Daughter      MEDICATIONS AT HOME: Prior to Admission medications   Medication Sig Start Date End Date Taking? Authorizing Provider  apixaban (ELIQUIS) 2.5 MG TABS tablet Take 2.5 mg by mouth 2 (two) times daily.   Yes Historical Provider, MD  aspirin 81 MG chewable tablet Chew 81 mg by mouth every morning.   Yes Historical Provider, MD  cyanocobalamin (,  VITAMIN B-12,) 1000 MCG/ML injection Inject 1,000 mcg into the muscle every 30 (thirty) days. On the 16th   Yes Historical Provider, MD  diltiazem (CARDIZEM CD) 120 MG 24 hr capsule Take 120 mg by mouth daily.   Yes Historical Provider, MD  docusate sodium (COLACE) 100 MG capsule Take 1 capsule (100 mg total) by mouth 2 (two) times daily. 05/20/16  Yes Heidi Caperima Vaickute, MD  furosemide (LASIX) 20 MG tablet  Take 20 mg by mouth daily.   Yes Historical Provider, MD  levothyroxine (SYNTHROID, LEVOTHROID) 100 MCG tablet Take 100 mcg by mouth daily.   Yes Historical Provider, MD  memantine (NAMENDA) 10 MG tablet Take 10 mg by mouth 2 (two) times daily.   Yes Historical Provider, MD  metoprolol (LOPRESSOR) 50 MG tablet Take 25 mg by mouth daily.   Yes Historical Provider, MD  mirtazapine (REMERON) 45 MG tablet Take 22.5 mg by mouth at bedtime.   Yes Historical Provider, MD  mupirocin ointment (BACTROBAN) 2 % Place 1 application into the nose 2 (two) times daily. 05/20/16  Yes Heidi Caperima Vaickute, MD  omeprazole (PRILOSEC) 20 MG capsule Take 20 mg by mouth daily.   Yes Historical Provider, MD  QUEtiapine (SEROQUEL XR) 50 MG TB24 24 hr tablet Take 25 mg by mouth every evening.    Yes Historical Provider, MD  rivastigmine (EXELON) 1.5 MG capsule Take 1.5 mg by mouth 2 (two) times daily with a meal. 1200 & 1700   Yes Historical Provider, MD  senna (SENOKOT) 8.6 MG TABS tablet Take 1 tablet (8.6 mg total) by mouth 2 (two) times daily. 05/20/16  Yes Heidi Caperima Vaickute, MD  spironolactone (ALDACTONE) 25 MG tablet Take 12.5 mg by mouth daily.   Yes Historical Provider, MD  venlafaxine XR (EFFEXOR-XR) 150 MG 24 hr capsule Take 150 mg by mouth daily.   Yes Historical Provider, MD  feeding supplement, ENSURE ENLIVE, (ENSURE ENLIVE) LIQD Take 237 mLs by mouth 2 (two) times daily between meals. 05/20/16   Heidi Caperima Vaickute, MD  polyethylene glycol (MIRALAX / GLYCOLAX) packet Take 17 g by mouth daily as needed for mild constipation. 05/20/16   Heidi Caperima Vaickute, MD      DRUG ALLERGIES: No Known Allergies   REVIEW OF SYSTEMS: Unable to obtain secondary to dementia  PHYSICAL EXAMINATION: VITAL SIGNS: Blood pressure 98/77, pulse (!) 138, resp. rate 20, height 5\' 3"  (1.6 m), weight 64.9 kg (143 lb), SpO2 100 %.  GENERAL: 82 y.o.-year-old pale white female patient, well-developed, well-nourished lying in the bed in no acute distress.   Responsive to voice, follows some commands, can state her name but is minimally interactive. HEENT: Head atraumatic, normocephalic. Pupils equal, round, reactive to light and accommodation. No scleral icterus. Extraocular muscles intact. Oropharynx is clear. Mucus membranes dry. NECK: Supple, full range of motion. No JVD, no bruit heard. No cervical lymphadenopathy. CHEST: Normal breath sounds bilaterally. No wheezing, rales, rhonchi or crackles. No use of accessory muscles of respiration.  No reproducible chest wall tenderness.  CARDIOVASCULAR: Irregularly irregular. S1, S2 normal. No murmurs, rubs, or gallops appreciated. Cap refill <2 seconds. ABDOMEN: Soft, nontender, nondistended. No rebound, guarding, rigidity. Normoactive bowel sounds present in all four quadrants. No organomegaly or mass. EXTREMITIES: Full range of motion. No pedal edema, cyanosis, or clubbing. NEUROLOGIC: Cannot assess secondary to dementia PSYCHIATRIC: The patient is arousable, oriented 1.. Normal affect, mood, thought content. SKIN: Warm, dry, and intact without obvious rash, lesion, or ulcer.  LABORATORY PANEL:  CBC  Recent Labs Lab  06/01/16 1855  WBC 7.1  HGB 12.4  HCT 41.0  PLT 322   ----------------------------------------------------------------------------------------------------------------- Chemistries  Recent Labs Lab 06/01/16 1855  NA 152*  K 3.2*  CL 117*  CO2 26  GLUCOSE 138*  BUN 42*  CREATININE 1.20*  CALCIUM 8.7*  AST 26  ALT 23  ALKPHOS 167*  BILITOT 1.2   ------------------------------------------------------------------------------------------------------------------ Cardiac Enzymes  Recent Labs Lab 06/01/16 1855  TROPONINI 0.06*   ------------------------------------------------------------------------------------------------------------------  RADIOLOGY: No results found.  EKG: Pending  IMPRESSION AND PLAN:  This is a 82 y.o. female with a history of  Atrial  fibrillation on Eliquis, coronary artery disease with LAD stents, CHF systolic dysfunction with an EF 20%, CVA, hypertension, ischemic cardiomyopathy, dementia and GERD  now being admitted with: 1. Rectal bleeding-at hasn't patient's blood counts are stable and she is hemodynamically stable. admit to inpatient for IV Protonix twice daily, serial CBCs, nothing by mouth, GI consultation. Hold aspirin and Eliquis at this time. I discussed the risks, benefits and alternatives of treating this rectal bleeding including the possibility that by holding Eliquis the patient is at increased risk of stroke. Patient's daughter prefers to pursue treating the GI bleed and obtaining gastroenterologist input with the goal of resuming Eliquis and aspirin as soon as possible. We'll also consult with cardiology for comanagement given the patient's significant comorbidities. 2. Hypernatremia and AKI due to volume contraction, decreased by mouth intake over the last several days-we'll hydrate with half normal saline and recheck BMP in the a.m. 3. Wallace Cullens zone troponin possibly secondary to demand ischemia-we'll monitor on telemetry and trend troponins 4. History of atrial fibrillation-continue Cardizem, metoprolol 5. History of CHF-continue Lasix and Aldactone 6. History of hypothyroid-continue Synthroid 7. History of GERD-patient will be on Protonix IV 8. History of depression-continue Effexor and Seroquel 9. History of dementia-continue Namenda Continue Exelon and Remeron  Diet/Nutrition: Nothing by mouth Fluids: Half normal saline DVT Px: SCDs and early ambulation. Chemoprophylaxis would be contraindicated at this time secondary to active bleeding. Code Status: DO NOT RESUSCITATE. Patient has a MOLST form on the chart.  All the records are reviewed and case discussed with ED provider. Management plans discussed with the patient and/or family who express understanding and agree with plan of care.   TOTAL TIME TAKING  CARE OF THIS PATIENT: 60 minutes.   Que Meneely D.O. on 06/01/2016 at 10:51 PM Between 7am to 6pm - Pager - [phone] After 6pm go to [email or web address] - Social research officer, government [facility] Office [phone] CC: Primary care physician; BABAOFF, Heidi Mesi, MD     Note: This dictation was prepared with Dragon dictation along with smaller phrase technology. Any transcriptional errors that result from this process are unintentional.

## 2016-06-01 NOTE — ED Notes (Signed)
Dr. Cyril LoosenKinner notified regarding critical lab value - Troponin 0.06.  Acknowledged, no new orders.

## 2016-06-01 NOTE — ED Triage Notes (Signed)
Pt presents to ED via [facility] from [facility] where staff report finding blood in pt's diaper. Pt arrives with a pink MOST form dated 05/21/16 that specified Comfort Measures only, no abt, no IVF, no feeding tube placement, and DNR. Pt arrives with 1L 0.45 NS infusing via subcutaneous infusion to RLQ, started by SNF staff. Pt's daughter speaking with EDP at this time about plan of care. Bright red blood noted in pt's diaper upon arrival.

## 2016-06-01 NOTE — ED Notes (Signed)
Dr. Dahlia BailiffHugelmyer in with patient and family.

## 2016-06-01 NOTE — ED Notes (Signed)
Report called to floor, given to [facility].

## 2016-06-01 NOTE — ED Notes (Signed)
Patient resting quietly at this time.  Family remains at bedside, support and encouragement given to family member.  Awaiting admission.

## 2016-06-01 NOTE — Consult Note (Signed)
CH responded to OR to provide support for PT whose daughter was alone in Ed. PT had additional family (niece) in the room with CH arrived. CH sat with family for nearly 30 minutes, including the DR visit. CH provided prayer following consult and informed family that [facility] an point if needed.

## 2016-06-01 NOTE — ED Provider Notes (Signed)
[facility] Emergency Department Provider Note   ____________________________________________    I have reviewed the triage vital signs and the nursing notes.   HISTORY  Chief Complaint GI Bleeding  History limited by dementia   HPI Heidi Miller is a 81 y.o. female who presents from [facility] with rectal bleeding.Patient with recent hip fracture repaired at [facility] 2 weeks ago, she is on eliquis for atrial fibrillation. She was noted to have right red blood in her diaper today. No other issues noted. Had a long discussion with daughter regarding patient's most form, daughter would like labs and IV started.   Past Medical History:  Diagnosis Date  . Atrial fibrillation (HCC)    on eliquis  . CAD (coronary artery disease)    s/p LAD stent  . CHF (congestive heart failure) (HCC)    systolic dysfunction, EF 20%  . CVA (cerebral vascular accident) (HCC)    in Sept 2017 with left internal capsule infarct  . Dementia   . Depression   . GERD (gastroesophageal reflux disease)   . Hypertension   . Ischemic cardiomyopathy   . Thyroid disease     Patient Active Problem List   Diagnosis Date Noted  . Acute renal insufficiency 05/20/2016  . Prediabetes 05/20/2016  . Hypoxia 05/20/2016  . Mild basilar atelectasis of both lungs 05/20/2016  . Acute posthemorrhagic anemia 05/20/2016  . H/O transfusion of packed red blood cells 05/20/2016  . Hypotension 05/20/2016  . Atrial fibrillation with RVR (HCC) 05/20/2016  . Hypernatremia 05/20/2016  . Sterile pyuria 05/20/2016  . MRSA carrier 05/20/2016  . Hip fracture (HCC) 05/16/2016  . Stroke (HCC) 03/14/2016  . A-fib (HCC) 03/14/2016  . Dementia 03/14/2016  . HTN (hypertension) 03/14/2016  . Hypothyroidism 03/14/2016    Past Surgical History:  Procedure Laterality Date  . CARDIAC CATHETERIZATION    . CHOLECYSTECTOMY    . FOOT SURGERY    . HERNIA REPAIR    .  INTRAMEDULLARY (IM) NAIL INTERTROCHANTERIC Left 05/17/2016   Procedure: INTRAMEDULLARY (IM) NAIL INTERTROCHANTRIC;  Surgeon: Christena Flake, MD;  Location: [facility] ORS;  Service: Orthopedics;  Laterality: Left;  . resection of left acoustic neuroma    . Right rotator cuff repair    . TONSILLECTOMY      Prior to Admission medications   Medication Sig Start Date End Date Taking? Authorizing Provider  apixaban (ELIQUIS) 2.5 MG TABS tablet Take 2.5 mg by mouth 2 (two) times daily.   Yes Historical Provider, MD  aspirin 81 MG chewable tablet Chew 81 mg by mouth every morning.   Yes Historical Provider, MD  cyanocobalamin (,VITAMIN B-12,) 1000 MCG/ML injection Inject 1,000 mcg into the muscle every 30 (thirty) days. On the 16th   Yes Historical Provider, MD  diltiazem (CARDIZEM CD) 120 MG 24 hr capsule Take 120 mg by mouth daily.   Yes Historical Provider, MD  docusate sodium (COLACE) 100 MG capsule Take 1 capsule (100 mg total) by mouth 2 (two) times daily. 05/20/16  Yes Katharina Caper, MD  furosemide (LASIX) 20 MG tablet Take 20 mg by mouth daily.   Yes Historical Provider, MD  levothyroxine (SYNTHROID, LEVOTHROID) 100 MCG tablet Take 100 mcg by mouth daily.   Yes Historical Provider, MD  memantine (NAMENDA) 10 MG tablet Take 10 mg by mouth 2 (two) times daily.   Yes Historical Provider, MD  metoprolol (LOPRESSOR) 50 MG tablet Take 25 mg by mouth daily.   Yes  Historical Provider, MD  mirtazapine (REMERON) 45 MG tablet Take 22.5 mg by mouth at bedtime.   Yes Historical Provider, MD  mupirocin ointment (BACTROBAN) 2 % Place 1 application into the nose 2 (two) times daily. 05/20/16  Yes Katharina Caperima Vaickute, MD  omeprazole (PRILOSEC) 20 MG capsule Take 20 mg by mouth daily.   Yes Historical Provider, MD  QUEtiapine (SEROQUEL XR) 50 MG TB24 24 hr tablet Take 25 mg by mouth every evening.    Yes Historical Provider, MD  rivastigmine (EXELON) 1.5 MG capsule Take 1.5 mg by mouth 2 (two) times daily with a meal. 1200 &  1700   Yes Historical Provider, MD  senna (SENOKOT) 8.6 MG TABS tablet Take 1 tablet (8.6 mg total) by mouth 2 (two) times daily. 05/20/16  Yes Katharina Caperima Vaickute, MD  spironolactone (ALDACTONE) 25 MG tablet Take 12.5 mg by mouth daily.   Yes Historical Provider, MD  venlafaxine XR (EFFEXOR-XR) 150 MG 24 hr capsule Take 150 mg by mouth daily.   Yes Historical Provider, MD  feeding supplement, ENSURE ENLIVE, (ENSURE ENLIVE) LIQD Take 237 mLs by mouth 2 (two) times daily between meals. 05/20/16   Katharina Caperima Vaickute, MD  polyethylene glycol (MIRALAX / GLYCOLAX) packet Take 17 g by mouth daily as needed for mild constipation. 05/20/16   Katharina Caperima Vaickute, MD     Allergies Patient has no known allergies.  Family History  Problem Relation Age of Onset  . Lung cancer Mother   . Heart attack Father   . CAD Daughter     Social History Social History  Substance Use Topics  . Smoking status: Never Smoker  . Smokeless tobacco: Never Used  . Alcohol use No    Level V caveat: Unable to obtain Review of Systems due to dementia      ____________________________________________   PHYSICAL EXAM:  VITAL SIGNS: ED Triage Vitals  Enc Vitals Group     BP 06/01/16 1700 111/74     Pulse Rate 06/01/16 1700 (!) 144     Resp 06/01/16 1700 16     Temp --      Temp src --      SpO2 06/01/16 1700 100 %     Weight 06/01/16 1704 143 lb (64.9 kg)     Height 06/01/16 1704 5\' 3"  (1.6 m)     Head Circumference --      [facility] Flow --      Pain Score --      Pain Loc --      Pain Edu? --      Excl. in GC? --     Constitutional: Alert. No acute distress.  Eyes: Conjunctivae are normal.  Head: Atraumatic.  Mouth/Throat: Mucous membranes are moist.    Cardiovascular: Tachycardia, irregularly irregular rhythm. Grossly normal heart sounds.  Good peripheral circulation. Respiratory: Normal respiratory effort.  No retractions. Lungs CTAB. Gastrointestinal: Soft. No distention. Reddish blood/stool in  diaper Genitourinary: deferred Musculoskeletal:  Warm and well perfused Neurologic:  Appears to move all extremities Skin:  Skin is warm, dry and intact. No rash noted.   ____________________________________________   LABS (all labs ordered are listed, but only abnormal results are displayed)  Labs Reviewed  CBC - Abnormal; Notable for the following:       Result Value   MCHC 30.3 (*)    RDW 25.1 (*)    All other components within normal limits  COMPREHENSIVE METABOLIC PANEL - Abnormal; Notable for the following:    Sodium 152 (*)  Potassium 3.2 (*)    Chloride 117 (*)    Glucose, Bld 138 (*)    BUN 42 (*)    Creatinine, Ser 1.20 (*)    Calcium 8.7 (*)    Total Protein 6.4 (*)    Albumin 3.1 (*)    Alkaline Phosphatase 167 (*)    GFR calc non Af Amer 38 (*)    GFR calc Af Amer 44 (*)    All other components within normal limits  TROPONIN I - Abnormal; Notable for the following:    Troponin I 0.06 (*)    All other components within normal limits   ____________________________________________  EKG  None ____________________________________________  RADIOLOGY  None ____________________________________________   PROCEDURES  Procedure(s) performed: No    Critical Care performed: No ____________________________________________   INITIAL IMPRESSION / ASSESSMENT AND PLAN / ED COURSE  Pertinent labs & imaging results that were available during my care of the patient were reviewed by me and considered in my medical decision making (see chart for details).  As noted above long discussion with daughter before she decided to allow IV and blood work  Clinical Course   Patient significantly hypernatremic, hemoglobin is unremarkable, per daughter's wishes we will admit the patient for treatment ____________________________________________   FINAL CLINICAL IMPRESSION(S) / ED DIAGNOSES  Final diagnoses:  Hypernatremia  Gastrointestinal hemorrhage,  unspecified gastrointestinal hemorrhage type      NEW MEDICATIONS STARTED DURING THIS VISIT:  New Prescriptions   No medications on file     Note:  This document was prepared using Dragon voice recognition software and may include unintentional dictation errors.    Jene Everyobert Brieanna Nau, MD 06/01/16 [id number]

## 2016-06-02 ENCOUNTER — Inpatient Hospital Stay
Admit: 2016-06-02 | Discharge: 2016-06-02 | Disposition: A | Discharge status: Home / Self Care | Payer: PPO | Attending: Cardiovascular Disease | Admitting: Cardiovascular Disease

## 2016-06-02 DIAGNOSIS — K922 Gastrointestinal hemorrhage, unspecified: Principal | ICD-10-CM

## 2016-06-02 LAB — BASIC METABOLIC PANEL
ANION GAP: 4 — AB (ref 5–15)
BUN: 37 mg/dL — ABNORMAL HIGH (ref 6–20)
CHLORIDE: 123 mmol/L — AB (ref 101–111)
CO2: 26 mmol/L (ref 22–32)
Calcium: 8.3 mg/dL — ABNORMAL LOW (ref 8.9–10.3)
Creatinine, Ser: 1.14 mg/dL — ABNORMAL HIGH (ref 0.44–1.00)
GFR calc non Af Amer: 41 mL/min — ABNORMAL LOW (ref >60)
GFR, EST AFRICAN AMERICAN: 47 mL/min — AB (ref >60)
Glucose, Bld: 138 mg/dL — ABNORMAL HIGH (ref 65–99)
POTASSIUM: 3.1 mmol/L — AB (ref 3.5–5.1)
SODIUM: 153 mmol/L — AB (ref 135–145)

## 2016-06-02 LAB — CBC
HCT: 35.6 % (ref 35.0–47.0)
HEMOGLOBIN: 10.7 g/dL — AB (ref 12.0–16.0)
MCH: 27.6 pg (ref 26.0–34.0)
MCHC: 30.1 g/dL — ABNORMAL LOW (ref 32.0–36.0)
MCV: 91.8 fL (ref 80.0–100.0)
Platelets: 271 10*3/uL (ref 150–440)
RBC: 3.88 MIL/uL (ref 3.80–5.20)
RDW: 24.2 % — ABNORMAL HIGH (ref 11.5–14.5)
WBC: 5.3 10*3/uL (ref 3.6–11.0)

## 2016-06-02 LAB — TROPONIN I
TROPONIN I: 0.04 ng/mL — AB (ref <0.03)
TROPONIN I: 0.04 ng/mL — AB (ref <0.03)
Troponin I: 0.04 ng/mL (ref <0.03)

## 2016-06-02 LAB — APTT: APTT: 30 s (ref 24–36)

## 2016-06-02 LAB — HEMOGLOBIN
HEMOGLOBIN: 10.3 g/dL — AB (ref 12.0–16.0)
HEMOGLOBIN: 11.4 g/dL — AB (ref 12.0–16.0)

## 2016-06-02 LAB — PROTIME-INR
INR: 1.61
Prothrombin Time: 19.3 seconds — ABNORMAL HIGH (ref 11.4–15.2)

## 2016-06-02 LAB — GLUCOSE, CAPILLARY: GLUCOSE-CAPILLARY: 130 mg/dL — AB (ref 65–99)

## 2016-06-02 MED ORDER — METOPROLOL TARTRATE 5 MG/5ML IV SOLN
2.5000 mg | Freq: Once | INTRAVENOUS | Status: AC
  Administered 2016-06-02: 2.5 mg via INTRAVENOUS
  Filled 2016-06-02: qty 5

## 2016-06-02 MED ORDER — SODIUM CHLORIDE 0.9 % IV BOLUS (SEPSIS)
1000.0000 mL | Freq: Once | INTRAVENOUS | Status: AC
Start: 2016-06-02 — End: 2016-06-02
  Administered 2016-06-02: 1000 mL via INTRAVENOUS

## 2016-06-02 MED ORDER — HALOPERIDOL LACTATE 5 MG/ML IJ SOLN
0.5000 mg | Freq: Once | INTRAMUSCULAR | Status: AC
  Administered 2016-06-02: 0.5 mg via INTRAVENOUS
  Filled 2016-06-02: qty 1

## 2016-06-02 MED ORDER — AMIODARONE HCL IN DEXTROSE 360-4.14 MG/200ML-% IV SOLN
30.0000 mg/h | INTRAVENOUS | Status: DC
  Administered 2016-06-03 (×2): 30 mg/h via INTRAVENOUS
  Filled 2016-06-02 (×5): qty 200

## 2016-06-02 MED ORDER — AMIODARONE HCL IN DEXTROSE 360-4.14 MG/200ML-% IV SOLN
60.0000 mg/h | INTRAVENOUS | Status: AC
  Administered 2016-06-02: 60 mg/h via INTRAVENOUS
  Filled 2016-06-02: qty 200

## 2016-06-02 MED ORDER — QUETIAPINE FUMARATE 25 MG PO TABS
25.0000 mg | ORAL_TABLET | Freq: Every day | ORAL | Status: DC
  Administered 2016-06-02: 25 mg via ORAL
  Filled 2016-06-02 (×2): qty 1

## 2016-06-02 MED ORDER — AMIODARONE LOAD VIA INFUSION
150.0000 mg | Freq: Once | INTRAVENOUS | Status: AC
  Administered 2016-06-02: 150 mg via INTRAVENOUS
  Filled 2016-06-02: qty 83.34

## 2016-06-02 NOTE — Progress Notes (Signed)
Received from floor at 1330 via bed. Oriented to self only. Follows extremely simple commands. Will not move legs but can swing legs over rails and off of bed. Screams and cries any time she is touched but moves around in bed freely without any noise. Picks and pulls at diaper, gown  and IV site. Mitts placed on hands bilaterally.More agitated than at 1330 admission. Upon admission started on Nexerone drip per orders.Bolous of 150mg  Nexerone given as well. O2 weaned off at 1700.

## 2016-06-02 NOTE — Progress Notes (Addendum)
Pt.'s HR sustaining 140's-150 (afib). Pt in no obvious distress, BP 102/60. Dr Emmit PomfretHugelmeyer made aware, will administer metoprolol as ordered, will monitor.  Addendum: HR 110's, BP 100/49. Pt remains without s/s of distress.

## 2016-06-02 NOTE — Progress Notes (Signed)
06/02/2016  Charge nurse placed bed request for stepdown unit according to Dr. Mathews RobinsonsWieting's order ~one hour and forty minutes ago.  After one hour and twenty minutes with no response, unit secretary called and spoke with CCU charge nurse concerning Heidi Miller's transfer to the unit.  As of this writing, there has been no response.  Bradly Chrisougherty, Husna Krone E, RN

## 2016-06-02 NOTE — Progress Notes (Signed)
HR sustaining at 130-140's (a-fib). BP 102/58. No obvious distress noted. Pt more alert and talkative this AM, denies distress. Dr. Tobi BastosPyreddy notified of HR and BP. Will administer AM cardiac meds as ordered.

## 2016-06-02 NOTE — Progress Notes (Signed)
UN BLE TO COMMUNICATE DUE TO ADVANCED DEMENTIA

## 2016-06-02 NOTE — Care Management (Addendum)
Patient was [facility] for reduction and internal fixation of three-part intertrochanteric left hip fracture with Biomet Affixis TFN nail on 05/16/16 with Dr. Joice LoftsPoggi. She went to [facility] for short-term rehab on 05/20/16. RNCM to follow for home health needs in case plan changes. Patient transferred to SDU/ICU due to hypotension on this date. Dr. Joice LoftsPoggi updated of patient's return.

## 2016-06-02 NOTE — Progress Notes (Signed)
Patient ID: Heidi Miller Vandegrift, female   DOB: 20-Sep-1922, 82 y.o.   MRN: [id number]  [employer] PROGRESS NOTE  Heidi Miller Sircy [id number] DOB: 20-Sep-1922 DOA: 06/01/2016 PCP: Rozanna BoxBABAOFF, MARC E, MD  HPI/Subjective: Patient with dementia and unable to give any history or physical. As per the nurse had a red bowel movement early morning. I was called with hypotension. Patient also has a history of atrial fibrillation and heart rate is been ranging anywhere from 110-140.  Objective: Vitals:   06/02/16 0953 06/02/16 0959  BP: (!) 78/68 (!) 70/62  Pulse:    Resp:    Temp:      Filed Weights   06/01/16 1704 06/01/16 2312  Weight: 64.9 kg (143 lb) 62.7 kg (138 lb 3.2 oz)    ROS: Review of Systems  Unable to perform ROS: Dementia   Exam: Physical Exam  HENT:  Nose: No mucosal edema.  Mouth/Throat: No oropharyngeal exudate or posterior oropharyngeal edema.  Eyes: Conjunctivae and lids are normal. Pupils are equal, round, and reactive to light.  Neck: No JVD present. Carotid bruit is not present. No edema present. No thyroid mass and no thyromegaly present.  Cardiovascular: S1 normal and S2 normal.  An irregularly irregular rhythm present. Tachycardia present.  Exam reveals no gallop.   Murmur heard.  Systolic murmur is present with a grade of 2/6  Pulses:      Dorsalis pedis pulses are 2+ on the right side, and 2+ on the left side.  Respiratory: No respiratory distress. She has decreased breath sounds in the right lower field and the left lower field. She has no wheezes. She has no rhonchi. She has no rales.  GI: Soft. Bowel sounds are normal. There is no tenderness.  Musculoskeletal:       Right ankle: She exhibits no swelling.       Left ankle: She exhibits no swelling.  Lymphadenopathy:    She has no cervical adenopathy.  Neurological:  Patient moving her arms on her own  Skin: Skin is warm. No rash noted. Nails show no clubbing.  Psychiatric:  Unable to assess with  dementia      Data Reviewed: Basic Metabolic Panel:  Recent Labs Lab 06/01/16 1855 06/02/16 0331  NA 152* 153*  K 3.2* 3.1*  CL 117* 123*  CO2 26 26  GLUCOSE 138* 138*  BUN 42* 37*  CREATININE 1.20* 1.14*  CALCIUM 8.7* 8.3*  MG 2.2  --   PHOS 3.5  --    Liver Function Tests:  Recent Labs Lab 06/01/16 1855  AST 26  ALT 23  ALKPHOS 167*  BILITOT 1.2  PROT 6.4*  ALBUMIN 3.1*   CBC:  Recent Labs Lab 06/01/16 1855 06/02/16 0331  WBC 7.1 5.3  HGB 12.4 10.7*  HCT 41.0 35.6  MCV 92.2 91.8  PLT 322 271   Cardiac Enzymes:  Recent Labs Lab 06/01/16 1855 06/02/16 0331  TROPONINI 0.06* 0.04*    Scheduled Meds: . amiodarone  150 mg Intravenous Once  . levothyroxine  100 mcg Oral QAC breakfast  . memantine  10 mg Oral BID  . mirtazapine  22.5 mg Oral QHS  . mupirocin ointment  1 application Nasal BID  . pantoprazole (PROTONIX) IV  40 mg Intravenous Q12H  . QUEtiapine  25 mg Oral QHS  . rivastigmine  1.5 mg Oral BID WC  . sodium chloride  1,000 mL Intravenous Once  . sodium chloride flush  3 mL Intravenous Q12H  . venlafaxine XR  150 mg Oral Daily   Continuous Infusions: . amiodarone     Followed by  . amiodarone    . dextrose 5 % and 0.45 % NaCl with KCl 20 mEq/L 75 mL/hr at 06/02/16 0042    Assessment/Plan:  1. Hypotension and rapid atrial fibrillation. Unable to give metoprolol and Cardizem at this time. Transfer to the CCU stepdown for amiodarone drip. IV fluid bolus. Serial hemoglobins. 2. Rectal bleeding likely diverticular in nature. Patient also on Eliquis, which is now on hold. Serial hemoglobins and transfuse as needed.Monitor closer in the CCU stepdown with  Hypotension. 3. Hypernatremia. On half-normal saline 4. History of dementia 5. Hypothyroidism unspecified on levothyroxine 6. Borderline troponin likely demand ischemia from rapid atrial fibrillation and hypotension 7. History of heart failure with cardiomyopathy. No signs of heart  failure currently. 8. Patient is a DO NOT RESUSCITATE. Unable to contact family at this time I left a message for the daughter.  Code Status:     Code Status Orders        Start     Ordered   06/02/16 0028  Do not attempt resuscitation (DNR)  Continuous    Question Answer Comment  In the event of cardiac or respiratory ARREST Do not call a "code blue"   In the event of cardiac or respiratory ARREST Do not perform Intubation, CPR, defibrillation or ACLS   In the event of cardiac or respiratory ARREST Use medication by any route, position, wound care, and other measures to relive pain and suffering. May use oxygen, suction and manual treatment of airway obstruction as needed for comfort.      06/02/16 0027    Code Status History    Date Active Date Inactive Code Status Order ID Comments User Context   06/01/2016 11:10 PM 06/02/2016 12:27 AM Full Code [id number]  Tonye RoyaltyAlexis Hugelmeyer, DO Inpatient   05/16/2016  2:38 PM 05/20/2016  6:17 PM DNR [id number]  Enid Baasadhika Kalisetti, MD Inpatient   03/16/2016 10:36 AM 03/17/2016  5:14 PM DNR [id number]  Auburn BilberryShreyang Patel, MD Inpatient   03/15/2016  1:08 AM 03/15/2016 10:36 AM Full Code [id number]  Oralia Manisavid Willis, MD Inpatient    Advance Directive Documentation   Flowsheet Row Most Recent Value  Type of Advance Directive  Out of facility DNR (pink MOST or yellow form) [pink MOST form]  Pre-existing out of facility DNR order (yellow form or pink MOST form)  Pink MOST form placed in chart (order not valid for inpatient use)  "MOST" Form in Place?  No data     Family Communication: left message for daughter. Disposition Plan: transfer to CCU stepdown for rapid atrial fibrillation and hypotension  Consultants:  Cardiology  Gastroenterology  Time spent: 30 minutes. Patient will be monitored closely in the CCU stepdown secondary to hypotension and rapid atrial fibrillation  Renae GlossWIETING, Kerr-McG[employer]ICHARD  [employer]

## 2016-06-02 NOTE — Progress Notes (Signed)
06/02/2016  10:00  Notified by nursing student that pt was hypotensive with BP 70s/60s.  Checked manual BP in left arm and got reading of 78/68.  Charge nurse verified manual BP at 70/60.  Notified Dr. Renae GlossWieting who ordered 1L bolus NS, IV amiodarone, and transfer to stepdown.  Awaiting approval from stepdown for transfer.  Rechecked BP after bolus, noted to 76/62.  Will continue to monitor and assess.  Bradly Chrisougherty, Cary Wilford E, RN

## 2016-06-02 NOTE — Consult Note (Signed)
Heidi Miniumarren Kemberly Taves, MD [facility]  [address] [place], [place] [address] Phone: [phone] Fax : [phone]  Consultation  Referring Provider:     No ref. provider found Primary Care Physician:  Rozanna BoxBABAOFF, MARC E, MD Primary Gastroenterologist:  Dr. Marva PandaSkulskie         Reason for Consultation:     Rectal bleeding  Date of Admission:  06/01/2016 Date of Consultation:  06/02/2016         HPI:   Heidi Miller is a 82 y.o. female was admitted with a left hip fracture back on the 11th of this month. She has a history of depression Alzheimer's dementia right facial deficit with a CVA and ischemic cardiomyopathy with an ejection fraction of 20%. The patient was admitted gas today for rectal bleeding. The patient's daughter reports that the patient has had increased lethargy and decreased appetite over the last several days. The patient had been seen by gastrology back in 2010 and 2011 but what was done at that time is unknown to me at this point. The patient is not able to give any history. The patient was reported to have a red bowel movement this morning and some hypotension. The patient was found to have atrial fibrillation with a heart rate ranging between 110 and 140. The patient was then brought to the ICU with hypotension. I'm now being asked to see the patient for GI bleeding.  Past Medical History:  Diagnosis Date  . Atrial fibrillation (HCC)    on eliquis  . CAD (coronary artery disease)    s/p LAD stent  . CHF (congestive heart failure) (HCC)    systolic dysfunction, EF 20%  . CVA (cerebral vascular accident) (HCC)    in Sept 2017 with left internal capsule infarct  . Dementia   . Depression   . GERD (gastroesophageal reflux disease)   . Hypertension   . Ischemic cardiomyopathy   . Thyroid disease     Past Surgical History:  Procedure Laterality Date  . CARDIAC CATHETERIZATION    . CHOLECYSTECTOMY    . FOOT SURGERY    . HERNIA REPAIR    . INTRAMEDULLARY (IM) NAIL  INTERTROCHANTERIC Left 05/17/2016   Procedure: INTRAMEDULLARY (IM) NAIL INTERTROCHANTRIC;  Surgeon: Christena FlakeJohn J Poggi, MD;  Location: [facility] ORS;  Service: Orthopedics;  Laterality: Left;  . resection of left acoustic neuroma    . Right rotator cuff repair    . TONSILLECTOMY      Prior to Admission medications   Medication Sig Start Date End Date Taking? Authorizing Provider  apixaban (ELIQUIS) 2.5 MG TABS tablet Take 2.5 mg by mouth 2 (two) times daily.   Yes Historical Provider, MD  aspirin 81 MG chewable tablet Chew 81 mg by mouth every morning.   Yes Historical Provider, MD  cyanocobalamin (,VITAMIN B-12,) 1000 MCG/ML injection Inject 1,000 mcg into the muscle every 30 (thirty) days. On the 16th   Yes Historical Provider, MD  diltiazem (CARDIZEM CD) 120 MG 24 hr capsule Take 120 mg by mouth daily.   Yes Historical Provider, MD  docusate sodium (COLACE) 100 MG capsule Take 1 capsule (100 mg total) by mouth 2 (two) times daily. 05/20/16  Yes Katharina Caperima Vaickute, MD  furosemide (LASIX) 20 MG tablet Take 20 mg by mouth daily.   Yes Historical Provider, MD  levothyroxine (SYNTHROID, LEVOTHROID) 100 MCG tablet Take 100 mcg by mouth daily.   Yes Historical Provider, MD  memantine (NAMENDA) 10 MG tablet Take 10 mg by mouth  2 (two) times daily.   Yes Historical Provider, MD  metoprolol (LOPRESSOR) 50 MG tablet Take 25 mg by mouth daily.   Yes Historical Provider, MD  mirtazapine (REMERON) 45 MG tablet Take 22.5 mg by mouth at bedtime.   Yes Historical Provider, MD  mupirocin ointment (BACTROBAN) 2 % Place 1 application into the nose 2 (two) times daily. 05/20/16  Yes Katharina Caperima Vaickute, MD  omeprazole (PRILOSEC) 20 MG capsule Take 20 mg by mouth daily.   Yes Historical Provider, MD  QUEtiapine (SEROQUEL XR) 50 MG TB24 24 hr tablet Take 25 mg by mouth every evening.    Yes Historical Provider, MD  rivastigmine (EXELON) 1.5 MG capsule Take 1.5 mg by mouth 2 (two) times daily with a meal. 1200 & 1700   Yes Historical  Provider, MD  senna (SENOKOT) 8.6 MG TABS tablet Take 1 tablet (8.6 mg total) by mouth 2 (two) times daily. 05/20/16  Yes Katharina Caperima Vaickute, MD  spironolactone (ALDACTONE) 25 MG tablet Take 12.5 mg by mouth daily.   Yes Historical Provider, MD  venlafaxine XR (EFFEXOR-XR) 150 MG 24 hr capsule Take 150 mg by mouth daily.   Yes Historical Provider, MD  feeding supplement, ENSURE ENLIVE, (ENSURE ENLIVE) LIQD Take 237 mLs by mouth 2 (two) times daily between meals. 05/20/16   Katharina Caperima Vaickute, MD  polyethylene glycol (MIRALAX / GLYCOLAX) packet Take 17 g by mouth daily as needed for mild constipation. 05/20/16   Katharina Caperima Vaickute, MD    Family History  Problem Relation Age of Onset  . Lung cancer Mother   . Heart attack Father   . CAD Daughter      Social History  Substance Use Topics  . Smoking status: Never Smoker  . Smokeless tobacco: Never Used  . Alcohol use No    Allergies as of 06/01/2016  . (No Known Allergies)    Review of Systems:    All systems reviewed and negative except where noted in HPI.   Physical Exam:  Vital signs in last 24 hours: Temp:  [97.7 F (36.5 C)-98 F (36.7 C)] 97.8 F (36.6 C) (11/27 1300) Pulse Rate:  [46-155] 94 (11/27 1500) Resp:  [13-28] 13 (11/27 1500) BP: (70-113)/(49-84) 90/71 (11/27 1500) SpO2:  [96 %-100 %] 100 % (11/27 1500) Weight:  [138 lb 3.2 oz (62.7 kg)-143 lb (64.9 kg)] 138 lb 3.2 oz (62.7 kg) (11/26 2312) Last BM Date: 06/02/16 General:   Confused not able to answer questions appropriately Head:  Normocephalic and atraumatic. Eyes:   No icterus.   Conjunctiva pink. PERRLA. Ears:  Normal auditory acuity. Neck:  Supple; no masses or thyroidomegaly Lungs: Respirations even and unlabored. Lungs clear to auscultation bilaterally.   No wheezes, crackles, or rhonchi.  Heart:  Irregular rate with tachycardia;  Without murmur, clicks, rubs or gallops Abdomen:  Soft, nondistended, nontender. Normal bowel sounds. No appreciable masses or  hepatomegaly.  No rebound or guarding.  Rectal:  Not performed. Msk:  Symmetrical without gross deformities.    Extremities:  Without edema, cyanosis or clubbing. Neurologic:  Confused and unable to obtain a neurological exam Skin:  Intact without significant lesions or rashes. Cervical Nodes:  No significant cervical adenopathy.  LAB RESULTS:  Recent Labs  06/01/16 1855 06/02/16 0331 06/02/16 0939  WBC 7.1 5.3  --   HGB 12.4 10.7* 10.3*  HCT 41.0 35.6  --   PLT 322 271  --    BMET  Recent Labs  06/01/16 1855 06/02/16 0331  NA 152*  153*  K 3.2* 3.1*  CL 117* 123*  CO2 26 26  GLUCOSE 138* 138*  BUN 42* 37*  CREATININE 1.20* 1.14*  CALCIUM 8.7* 8.3*   LFT  Recent Labs  06/01/16 1855  PROT 6.4*  ALBUMIN 3.1*  AST 26  ALT 23  ALKPHOS 167*  BILITOT 1.2   PT/INR  Recent Labs  06/02/16 0331  LABPROT 19.3*  INR 1.61    STUDIES: No results found.    Impression / Plan:   Heidi Miller is a 82 y.o. y/o female with a drop in her hemoglobin from 12.4 on admission down to 10.3 today. The patient was on Eliquis but it has been stopped. The patient's hemoglobin has remained stable over the last 24 hours. The patients condition was discussed with her daughter. With her history of a ejection fraction 20% and her age in conjunction with her tachycardia, this patient would be a high risk for any endoscopic procedures at this time. The daughter has told me she would like to make sure her mother is comfortable. Hopefully since the patient's anticoagulation has been held she will no longer bleed. If she does bleed a bleeding scan may need to be obtained and whether the family wants to put her at risk of having endoscopic procedures we'll then have to be readdressed. The patient's daughter has been explained the plan and agrees with it.  Thank you for involving me in the care of this patient.      LOS: 1 day   Heidi Minium, MD  06/02/2016, 4:56 PM   Note: This  dictation was prepared with Dragon dictation along with smaller phrase technology. Any transcriptional errors that result from this process are unintentional.

## 2016-06-02 NOTE — Progress Notes (Signed)
Heidi Miller is a 82 y.o. female  [id number]  Primary Cardiologist: Adrian BlackwaterShaukat Deavion Strider Reason for Consultation: GI bleed and elevated troponin  HPI: This is a 82 year old white female with a past medical history of PCI and stenting of the LAD and atrial fibrillation on Eliquis presented to the hospital with GI bleed. Patient is unable to give any history but denies any chest pain or shortness of breath.   Review of Systems: Unable to obtain further review of systems   Past Medical History:  Diagnosis Date  . Atrial fibrillation (HCC)    on eliquis  . CAD (coronary artery disease)    s/p LAD stent  . CHF (congestive heart failure) (HCC)    systolic dysfunction, EF 20%  . CVA (cerebral vascular accident) (HCC)    in Sept 2017 with left internal capsule infarct  . Dementia   . Depression   . GERD (gastroesophageal reflux disease)   . Hypertension   . Ischemic cardiomyopathy   . Thyroid disease     Medications Prior to Admission  Medication Sig Dispense Refill  . apixaban (ELIQUIS) 2.5 MG TABS tablet Take 2.5 mg by mouth 2 (two) times daily.    Marland Kitchen. aspirin 81 MG chewable tablet Chew 81 mg by mouth every morning.    . cyanocobalamin (,VITAMIN B-12,) 1000 MCG/ML injection Inject 1,000 mcg into the muscle every 30 (thirty) days. On the 16th    . diltiazem (CARDIZEM CD) 120 MG 24 hr capsule Take 120 mg by mouth daily.    Marland Kitchen. docusate sodium (COLACE) 100 MG capsule Take 1 capsule (100 mg total) by mouth 2 (two) times daily. 10 capsule 0  . furosemide (LASIX) 20 MG tablet Take 20 mg by mouth daily.    Marland Kitchen. levothyroxine (SYNTHROID, LEVOTHROID) 100 MCG tablet Take 100 mcg by mouth daily.    . memantine (NAMENDA) 10 MG tablet Take 10 mg by mouth 2 (two) times daily.    . metoprolol (LOPRESSOR) 50 MG tablet Take 25 mg by mouth daily.    . mirtazapine (REMERON) 45 MG tablet Take 22.5 mg by mouth at bedtime.    . mupirocin ointment (BACTROBAN) 2 % Place 1 application into the nose 2 (two) times  daily. 22 g 0  . omeprazole (PRILOSEC) 20 MG capsule Take 20 mg by mouth daily.    . QUEtiapine (SEROQUEL XR) 50 MG TB24 24 hr tablet Take 25 mg by mouth every evening.     . rivastigmine (EXELON) 1.5 MG capsule Take 1.5 mg by mouth 2 (two) times daily with a meal. 1200 & 1700    . senna (SENOKOT) 8.6 MG TABS tablet Take 1 tablet (8.6 mg total) by mouth 2 (two) times daily. 120 each 0  . spironolactone (ALDACTONE) 25 MG tablet Take 12.5 mg by mouth daily.    Marland Kitchen. venlafaxine XR (EFFEXOR-XR) 150 MG 24 hr capsule Take 150 mg by mouth daily.    . feeding supplement, ENSURE ENLIVE, (ENSURE ENLIVE) LIQD Take 237 mLs by mouth 2 (two) times daily between meals. 237 mL 12  . polyethylene glycol (MIRALAX / GLYCOLAX) packet Take 17 g by mouth daily as needed for mild constipation. 14 each 0     . diltiazem  120 mg Oral Daily  . furosemide  20 mg Oral Daily  . levothyroxine  100 mcg Oral QAC breakfast  . memantine  10 mg Oral BID  . metoprolol  25 mg Oral Daily  . mirtazapine  22.5 mg Oral  QHS  . mupirocin ointment  1 application Nasal BID  . pantoprazole (PROTONIX) IV  40 mg Intravenous Q12H  . QUEtiapine  25 mg Oral QHS  . rivastigmine  1.5 mg Oral BID WC  . sodium chloride flush  3 mL Intravenous Q12H  . spironolactone  12.5 mg Oral Daily  . venlafaxine XR  150 mg Oral Daily    Infusions: . dextrose 5 % and 0.45 % NaCl with KCl 20 mEq/L 75 mL/hr at 06/02/16 0042    No Known Allergies  Social History   Social History  . Marital status: Widowed    Spouse name: N/A  . Number of children: N/A  . Years of education: N/A   Occupational History  . Not on file.   Social History Main Topics  . Smoking status: Never Smoker  . Smokeless tobacco: Never Used  . Alcohol use No  . Drug use: No  . Sexual activity: Not on file   Other Topics Concern  . Not on file   Social History Narrative   Lives at home by herself. Has care during the day. Daughter visits every day. Has a walker to  ambulate, patient forgets to use it however.    Family History  Problem Relation Age of Onset  . Lung cancer Mother   . Heart attack Father   . CAD Daughter     PHYSICAL EXAM: Vitals:   06/02/16 0215 06/02/16 0533  BP: (!) 100/49 (!) 102/58  Pulse: (!) 118 (!) 132  Resp:  18  Temp:  97.7 F (36.5 C)     Intake/Output Summary (Last 24 hours) at 06/02/16 0911 Last data filed at 06/02/16 0300  Gross per 24 hour  Intake            172.5 ml  Output                0 ml  Net            172.5 ml    General:  Well appearing. No respiratory difficulty HEENT: normal Neck: supple. no JVD. Carotids 2+ bilat; no bruits. No lymphadenopathy or thryomegaly appreciated. Cor: PMI nondisplaced. Regular rate & rhythm. No rubs, gallops or murmurs. Lungs: clear Abdomen: soft, nontender, nondistended. No hepatosplenomegaly. No bruits or masses. Good bowel sounds. Extremities: no cyanosis, clubbing, rash, edema Neuro: alert & oriented x 3, cranial nerves grossly intact. moves all 4 extremities w/o difficulty. Affect pleasant.  ECG: Atrial fibrillation with incomplete left bundle branch block  Results for orders placed or performed during the hospital encounter of 06/01/16 (from the past 24 hour(s))  CBC     Status: Abnormal   Collection Time: 06/01/16  6:55 PM  Result Value Ref Range   WBC 7.1 3.6 - 11.0 K/uL   RBC 4.45 3.80 - 5.20 MIL/uL   Hemoglobin 12.4 12.0 - 16.0 g/dL   HCT 78.2 95.6 - 21.3 %   MCV 92.2 80.0 - 100.0 fL   MCH 28.0 26.0 - 34.0 pg   MCHC 30.3 (L) 32.0 - 36.0 g/dL   RDW 08.6 (H) 57.8 - 46.9 %   Platelets 322 150 - 440 K/uL  Comprehensive metabolic panel     Status: Abnormal   Collection Time: 06/01/16  6:55 PM  Result Value Ref Range   Sodium 152 (H) 135 - 145 mmol/L   Potassium 3.2 (L) 3.5 - 5.1 mmol/L   Chloride 117 (H) 101 - 111 mmol/L   CO2 26 22 - 32  mmol/L   Glucose, Bld 138 (H) 65 - 99 mg/dL   BUN 42 (H) 6 - 20 mg/dL   Creatinine, Ser 4.741.20 (H) 0.44 - 1.00  mg/dL   Calcium 8.7 (L) 8.9 - 10.3 mg/dL   Total Protein 6.4 (L) 6.5 - 8.1 g/dL   Albumin 3.1 (L) 3.5 - 5.0 g/dL   AST 26 15 - 41 U/L   ALT 23 14 - 54 U/L   Alkaline Phosphatase 167 (H) 38 - 126 U/L   Total Bilirubin 1.2 0.3 - 1.2 mg/dL   GFR calc non Af Amer 38 (L) >60 mL/min   GFR calc Af Amer 44 (L) >60 mL/min   Anion gap 9 5 - 15  Troponin I     Status: Abnormal   Collection Time: 06/01/16  6:55 PM  Result Value Ref Range   Troponin I 0.06 (HH) <0.03 ng/mL  Magnesium     Status: None   Collection Time: 06/01/16  6:55 PM  Result Value Ref Range   Magnesium 2.2 1.7 - 2.4 mg/dL  Phosphorus     Status: None   Collection Time: 06/01/16  6:55 PM  Result Value Ref Range   Phosphorus 3.5 2.5 - 4.6 mg/dL  Basic metabolic panel     Status: Abnormal   Collection Time: 06/02/16  3:31 AM  Result Value Ref Range   Sodium 153 (H) 135 - 145 mmol/L   Potassium 3.1 (L) 3.5 - 5.1 mmol/L   Chloride 123 (H) 101 - 111 mmol/L   CO2 26 22 - 32 mmol/L   Glucose, Bld 138 (H) 65 - 99 mg/dL   BUN 37 (H) 6 - 20 mg/dL   Creatinine, Ser 2.591.14 (H) 0.44 - 1.00 mg/dL   Calcium 8.3 (L) 8.9 - 10.3 mg/dL   GFR calc non Af Amer 41 (L) >60 mL/min   GFR calc Af Amer 47 (L) >60 mL/min   Anion gap 4 (L) 5 - 15  CBC     Status: Abnormal   Collection Time: 06/02/16  3:31 AM  Result Value Ref Range   WBC 5.3 3.6 - 11.0 K/uL   RBC 3.88 3.80 - 5.20 MIL/uL   Hemoglobin 10.7 (L) 12.0 - 16.0 g/dL   HCT 56.335.6 87.535.0 - 64.347.0 %   MCV 91.8 80.0 - 100.0 fL   MCH 27.6 26.0 - 34.0 pg   MCHC 30.1 (L) 32.0 - 36.0 g/dL   RDW 32.924.2 (H) 51.811.5 - 84.114.5 %   Platelets 271 150 - 440 K/uL  Protime-INR     Status: Abnormal   Collection Time: 06/02/16  3:31 AM  Result Value Ref Range   Prothrombin Time 19.3 (H) 11.4 - 15.2 seconds   INR 1.61   APTT     Status: None   Collection Time: 06/02/16  3:31 AM  Result Value Ref Range   aPTT 30 24 - 36 seconds  Troponin I (q 6hr x 3)     Status: Abnormal   Collection Time: 06/02/16  3:31 AM   Result Value Ref Range   Troponin I 0.04 (HH) <0.03 ng/mL   No results found.   ASSESSMENT AND PLAN: History of atrial fibrillation with mildly elevated troponin and GI bleed in the setting of patient being on anticoagulant Eliquis. Advise holding off on Eliquis and may even hold aspirin at this time and proceed with doing EGD if needed.  Arne Schlender A

## 2016-06-03 LAB — ECHOCARDIOGRAM COMPLETE
Height: 63 in
Weight: 2211.2 oz

## 2016-06-03 LAB — POTASSIUM: Potassium: 3.3 mmol/L — ABNORMAL LOW (ref 3.5–5.1)

## 2016-06-03 LAB — HEMOGLOBIN
HEMOGLOBIN: 11.7 g/dL — AB (ref 12.0–16.0)
Hemoglobin: 11.4 g/dL — ABNORMAL LOW (ref 12.0–16.0)

## 2016-06-03 LAB — MRSA PCR SCREENING: MRSA by PCR: NEGATIVE

## 2016-06-03 MED ORDER — HALOPERIDOL 0.5 MG PO TABS
0.5000 mg | ORAL_TABLET | Freq: Two times a day (BID) | ORAL | Status: DC
  Administered 2016-06-03 (×2): 0.5 mg via ORAL
  Filled 2016-06-03 (×3): qty 1

## 2016-06-03 MED ORDER — MORPHINE SULFATE (PF) 4 MG/ML IV SOLN
1.0000 mg | Freq: Once | INTRAVENOUS | Status: AC
  Administered 2016-06-03: 1 mg via INTRAVENOUS
  Filled 2016-06-03: qty 1

## 2016-06-03 MED ORDER — ENALAPRIL MALEATE 5 MG PO TABS
5.0000 mg | ORAL_TABLET | Freq: Every day | ORAL | Status: DC
  Filled 2016-06-03: qty 1

## 2016-06-03 MED ORDER — MORPHINE SULFATE 2 MG/ML IJ SOLN: 1.0000 mg | Freq: Once | INTRAMUSCULAR | Status: DC

## 2016-06-03 MED ORDER — QUETIAPINE FUMARATE 25 MG PO TABS
50.0000 mg | ORAL_TABLET | Freq: Every day | ORAL | Status: DC
  Administered 2016-06-03 – 2016-06-04 (×2): 50 mg via ORAL
  Filled 2016-06-03 (×2): qty 2

## 2016-06-03 MED ORDER — MORPHINE SULFATE (PF) 4 MG/ML IV SOLN: 1.0000 mg | Freq: Once | INTRAVENOUS | Status: DC

## 2016-06-03 MED ORDER — CARVEDILOL 6.25 MG PO TABS
6.2500 mg | ORAL_TABLET | Freq: Two times a day (BID) | ORAL | Status: DC
  Administered 2016-06-03: 6.25 mg via ORAL
  Filled 2016-06-03 (×2): qty 1

## 2016-06-03 NOTE — Evaluation (Signed)
Clinical/Bedside Swallow Evaluation Patient Details  Name: Heidi PrimerMavis W Peasley MRN: [id number] Date of Birth: [birth year]  Today's Date: 06/03/2016 Time: SLP Start Time (ACUTE ONLY): 1400 SLP Stop Time (ACUTE ONLY): 1500 SLP Time Calculation (min) (ACUTE ONLY): 60 min  Past Medical History:  Past Medical History:  Diagnosis Date  . Atrial fibrillation (HCC)    on eliquis  . CAD (coronary artery disease)    s/p LAD stent  . CHF (congestive heart failure) (HCC)    systolic dysfunction, EF 20%  . CVA (cerebral vascular accident) (HCC)    in Sept 2017 with left internal capsule infarct  . Dementia   . Depression   . GERD (gastroesophageal reflux disease)   . Hypertension   . Ischemic cardiomyopathy   . Thyroid disease    Past Surgical History:  Past Surgical History:  Procedure Laterality Date  . CARDIAC CATHETERIZATION    . CHOLECYSTECTOMY    . FOOT SURGERY    . HERNIA REPAIR    . INTRAMEDULLARY (IM) NAIL INTERTROCHANTERIC Left 05/17/2016   Procedure: INTRAMEDULLARY (IM) NAIL INTERTROCHANTRIC;  Surgeon: Christena FlakeJohn J Poggi, MD;  Location: [facility] ORS;  Service: Orthopedics;  Laterality: Left;  . resection of left acoustic neuroma    . Right rotator cuff repair    . TONSILLECTOMY     HPI:  Pt is a 82 y.o. female with a known history of Atrial fibrillation on eliquis, recent admission 2 months ago for an acute CVA with minimal right facial deficits, Alzheimer's dementia, depression, hypertension, ischemic cardiomyopathy with EF of 20%, hypothyroidism presents from home secondary to a fall and noted to have left hip intertrochanteric fracture. NSG reported pt has tolerated her current Pureed diet w/ thin liquids well w/ no s/s of aspiration during intake noted; also tolerating meds in puree. Pt remains slightly drowsy per NSG.    Assessment / Plan / Recommendation Clinical Impression  Pt appeared to tolerate few trials of Nectar liquids delivered orally via pinched straw(bolus material placed  anteriorly in cheek) and few trials of purees with no immediate, overt s/s of aspiration during/post po trials. Pt exhibited no decline in respiratory status w/ no change in O2 sats or RR/HR. Pt did require max cues during oral intake w/ max cues given to encourage oral intake of trials; pt was agitated. Pt appears at increased risk for aspiration d/t declined Cognitive status/Dementia; agitation currently, and d/t being a dependent feeder. Recommended a trial diet of Dysphagia 1 diet with Nectar liquids w/ strict aspiration precautions and feeding of po's only if fully alert to task and calm. ST services will f/u w/ toleration of diet and make adjustments to diet consistency as indicated.     Aspiration Risk  Moderate aspiration risk    Diet Recommendation  Trial of Dysphagia level 1 w/ nectar liquids; strict aspiration precautions; feeding assistance w/ all po's - Stop giving po's if pt cannot attend or remain calm.   Medication Administration: Crushed with puree (or via alternative means)    Other  Recommendations Recommended Consults:  (Dietician) Oral Care Recommendations: Oral care BID;Staff/trained caregiver to provide oral care Other Recommendations: Order thickener from pharmacy;Prohibited food (jello, ice cream, thin soups);Remove water pitcher;Have oral suction available   Follow up Recommendations  (TBD)      Frequency and Duration min 3x week  2 weeks       Prognosis Prognosis for Safe Diet Advancement: Guarded Barriers to Reach Goals: Cognitive deficits;Severity of deficits;Behavior      Swallow Study  General Date of Onset: 06/01/16 HPI: Pt is a 82 y.o. female with a known history of Atrial fibrillation on eliquis, recent admission 2 months ago for an acute CVA with minimal right facial deficits, Alzheimer's dementia, depression, hypertension, ischemic cardiomyopathy with EF of 20%, hypothyroidism presents from home secondary to a fall and noted to have left hip  intertrochanteric fracture. NSG reported pt has tolerated her current Pureed diet w/ thin liquids well w/ no s/s of aspiration during intake noted; also tolerating meds in puree. Pt remains slightly drowsy per NSG.  Type of Study: Bedside Swallow Evaluation Previous Swallow Assessment: 05/19/16 Diet Prior to this Study: Dysphagia 1 (puree);Thin liquids Temperature Spikes Noted: No Respiratory Status: Nasal cannula (2 liters) History of Recent Intubation: No Behavior/Cognition: Agitated;Confused;Uncooperative;Requires cueing;Doesn't follow directions Oral Cavity Assessment: Dried secretions;Dry (unable to assess fully) Oral Care Completed by SLP: Recent completion by staff Oral Cavity - Dentition: Missing dentition Vision:  (n/a) Self-Feeding Abilities: Total assist Patient Positioning: Upright in bed;Postural control adequate for testing Baseline Vocal Quality: Low vocal intensity (mumbled responses x4-5) Volitional Cough: Cognitively unable to elicit Volitional Swallow: Unable to elicit    Oral/Motor/Sensory Function Overall Oral Motor/Sensory Function:  (unable to assess d/t declined Cognitive status)   Ice Chips Ice chips: Not tested   Thin Liquid Thin Liquid: Not tested    Nectar Thick Nectar Thick Liquid: Impaired Presentation: Straw (pinched straw trials placed anteriorly in mouth) Oral Phase Impairments: Poor awareness of bolus (poor coordination and management) Oral phase functional implications: Prolonged oral transit Pharyngeal Phase Impairments: Suspected delayed Swallow Other Comments: max cues given to encourage oral intake of trials; pt agitated   Honey Thick Honey Thick Liquid: Not tested   Puree Puree: Impaired Presentation: Spoon (fed; 4 trials) Oral Phase Impairments: Poor awareness of bolus (increased oral phase time for clearing) Oral Phase Functional Implications: Prolonged oral transit Pharyngeal Phase Impairments: Suspected delayed Swallow Other Comments: max  cues given to encourage oral intake of trials; pt agitated   Solid   GO             Jerilynn SomKatherine Pauline Trainer, MS, CCC-SLP  Adisyn Ruscitti 06/03/2016,4:35 PM

## 2016-06-03 NOTE — [place] FL2 (Signed)
[place] MEDICAID FL2 LEVEL OF CARE SCREENING TOOL     IDENTIFICATION  Patient Name: Heidi PrimerMavis W Lobos Birthdate: [birth year] Sex: female Admission Date (Current Location): 06/01/2016  [place]   Facility and Address:  [facility], [address], [place], [place] [address]      Provider Number: [id number]  Attending Physician Name and Address:  Alford Highlandichard Wieting, MD  Relative Name and Phone Number:       Current Level of Care: Hospital Recommended Level of Care: Skilled Nursing Facility Prior Approval Number:    Date Approved/Denied:   PASRR Number: [id number]  Discharge Plan: SNF    Current Diagnoses: Patient Active Problem List   Diagnosis Date Noted  . Gastrointestinal hemorrhage   . Lower GI bleed 06/01/2016  . Acute renal insufficiency 05/20/2016  . Prediabetes 05/20/2016  . Hypoxia 05/20/2016  . Mild basilar atelectasis of both lungs 05/20/2016  . Acute posthemorrhagic anemia 05/20/2016  . H/O transfusion of packed red blood cells 05/20/2016  . Hypotension 05/20/2016  . Atrial fibrillation with RVR (HCC) 05/20/2016  . Hypernatremia 05/20/2016  . Sterile pyuria 05/20/2016  . MRSA carrier 05/20/2016  . Hip fracture (HCC) 05/16/2016  . Stroke (HCC) 03/14/2016  . A-fib (HCC) 03/14/2016  . Dementia 03/14/2016  . HTN (hypertension) 03/14/2016  . Hypothyroidism 03/14/2016    Orientation RESPIRATION BLADDER Height & Weight     Self  Normal Incontinent Weight: 138 lb 3.2 oz (62.7 kg) Height:  5\' 3"  (160 cm)  BEHAVIORAL SYMPTOMS/MOOD NEUROLOGICAL BOWEL NUTRITION STATUS   (none)  (none) Incontinent Diet (dysphagia 1)  AMBULATORY STATUS COMMUNICATION OF NEEDS Skin   Total Care Verbally Normal (recent hip surgery with closed incision)                       Personal Care Assistance Level of Assistance  Total care Bathing Assistance: Maximum assistance   Dressing Assistance: Maximum assistance Total  Care Assistance: Maximum assistance   Functional Limitations Info             SPECIAL CARE FACTORS FREQUENCY                       Contractures Contractures Info: Not present    Additional Factors Info  Code Status, Allergies, Isolation Precautions Code Status Info: dnr Allergies Info: nka           Current Medications (06/03/2016):  This is the current hospital active medication list Current Facility-Administered Medications  Medication Dose Route Frequency Provider Last Rate Last Dose  . acetaminophen (TYLENOL) tablet 650 mg  650 mg Oral Q6H PRN Alexis Hugelmeyer, DO   650 mg at 06/02/16 0045   Or  . acetaminophen (TYLENOL) suppository 650 mg  650 mg Rectal Q6H PRN Alexis Hugelmeyer, DO      . amiodarone (NEXTERONE PREMIX) 360-4.14 MG/200ML-% (1.8 mg/mL) IV infusion  30 mg/hr Intravenous Continuous Alford Highlandichard Wieting, MD 16.7 mL/hr at 06/03/16 0352 30 mg/hr at 06/03/16 0352  . bisacodyl (DULCOLAX) EC tablet 5 mg  5 mg Oral Daily PRN Alexis Hugelmeyer, DO      . carvedilol (COREG) tablet 6.25 mg  6.25 mg Oral BID WC Berton BonJanine Hammond, NP      . dextrose 5 % and 0.45 % NaCl with KCl 20 mEq/L infusion   Intravenous Continuous Alexis Hugelmeyer, DO 75 mL/hr at 06/02/16 0042    . enalapril (VASOTEC) tablet 5 mg  5 mg Oral  Daily Berton BonJanine Hammond, NP      . haloperidol (HALDOL) tablet 0.5 mg  0.5 mg Oral BID Alford Highlandichard Wieting, MD      . HYDROcodone-acetaminophen (NORCO/VICODIN) 5-325 MG per tablet 1-2 tablet  1-2 tablet Oral Q4H PRN Alexis Hugelmeyer, DO      . levothyroxine (SYNTHROID, LEVOTHROID) tablet 100 mcg  100 mcg Oral QAC breakfast Alexis Hugelmeyer, DO   100 mcg at 06/03/16 1446  . magnesium citrate solution 1 Bottle  1 Bottle Oral Once PRN Alexis Hugelmeyer, DO      . memantine (NAMENDA) tablet 10 mg  10 mg Oral BID Alexis Hugelmeyer, DO   Stopped at 06/02/16 1000  . mirtazapine (REMERON) tablet 22.5 mg  22.5 mg Oral QHS Alexis Hugelmeyer, DO   22.5 mg at 06/02/16 0045  .  ondansetron (ZOFRAN) tablet 4 mg  4 mg Oral Q6H PRN Alexis Hugelmeyer, DO       Or  . ondansetron (ZOFRAN) injection 4 mg  4 mg Intravenous Q6H PRN Alexis Hugelmeyer, DO      . pantoprazole (PROTONIX) injection 40 mg  40 mg Intravenous Q12H Alexis Hugelmeyer, DO   40 mg at 06/02/16 2222  . QUEtiapine (SEROQUEL) tablet 50 mg  50 mg Oral QHS Alford Highlandichard Wieting, MD      . rivastigmine (EXELON) capsule 1.5 mg  1.5 mg Oral BID WC Alexis Hugelmeyer, DO      . senna-docusate (Senokot-S) tablet 1 tablet  1 tablet Oral QHS PRN Alexis Hugelmeyer, DO      . sodium chloride flush (NS) 0.9 % injection 3 mL  3 mL Intravenous Q12H Alexis Hugelmeyer, DO   3 mL at 06/03/16 1000  . venlafaxine XR (EFFEXOR-XR) 24 hr capsule 150 mg  150 mg Oral Daily Alexis Hugelmeyer, DO   Stopped at 06/02/16 1000     Discharge Medications: Please see discharge summary for a list of discharge medications.  Relevant Imaging Results:  Relevant Lab Results:   Additional Information    York SpanielMonica Adwoa Axe, LCSW

## 2016-06-03 NOTE — Progress Notes (Signed)
Initial Nutrition Assessment  DOCUMENTATION CODES:   Not applicable  INTERVENTION:  -Diet advancement as able, SLP eval pending -Recommend addition of nutritional supplement such as Ensure or Magic Cup once diet advanced  NUTRITION DIAGNOSIS:   Inadequate oral intake related to acute illness, poor appetite as evidenced by NPO status.  GOAL:   Patient will meet greater than or equal to 90% of their needs  MONITOR:   PO intake, Supplement acceptance, Labs, Weight trends, Diet advancement  REASON FOR ASSESSMENT:   Low Braden    ASSESSMENT:    82 yo female admitted with hypotension and rapid afib, rectal bleeding likely diverticular in nature, hypernatremia. Pt with hx of dementia, heart failure/CM with EF 20%. Pt fell and broke hip 2 weeks ago requiring surgery  NPO at present, SLP pending.  No further sign of GI bleeding, no intervention planned at present per GI  Pt very confused, pt screams any time she is touched. RN requested that RD not perform physical exam on visit today. Pt with 5.5% loss in 2-3 months per weight encounters.   Labs: sodium 153, potassium 3.1, Hgb 11.4 Meds: D5-1/2 NS with KCl at 75 ml/hr  Diet Order:  Diet NPO time specified  Skin:  Reviewed, no issues  Last BM:  11/28  Height:   Ht Readings from Last 1 Encounters:  06/01/16 5\' 3"  (1.6 m)    Weight:   Wt Readings from Last 1 Encounters:  06/01/16 138 lb 3.2 oz (62.7 kg)    BMI:  Body mass index is 24.48 kg/m.  Estimated Nutritional Needs:   Kcal:  1400-1700 kcals   Protein:  70-85 g  Fluid:  >/= 1.5 L  EDUCATION NEEDS:   No education needs identified at this time  Romelle StarcherCate Kreed Kauffman MS, RD, LDN [phone] Pager  [phone] Weekend/On-Call Pager

## 2016-06-03 NOTE — Progress Notes (Signed)
Report called to Chrystal RN on 2A

## 2016-06-03 NOTE — Progress Notes (Signed)
Patient ID: Heidi Miller, female   DOB: [birth year], 82 y.o.   MRN: [id number]  [facility] PROGRESS NOTE  Heidi Miller [id number] DOB: [birth year] DOA: 06/01/2016 PCP: Rozanna BoxBABAOFF, MARC E, MD  HPI/Subjective: Patient with dementia and only slept 2 hours last night. She is thrashing around in the bed and sideways in the bed. Heart rate and blood pressure variable.  Objective: Vitals:   06/03/16 1300 06/03/16 1400  BP: 132/77 (!) 143/102  Pulse: (!) 124 (!) 109  Resp: (!) 25 (!) 22  Temp: 98.7 F (37.1 C)     Filed Weights   06/01/16 1704 06/01/16 2312  Weight: 64.9 kg (143 lb) 62.7 kg (138 lb 3.2 oz)    ROS: Review of Systems  Unable to perform ROS: Dementia   Exam: Physical Exam  HENT:  Nose: No mucosal edema.  Mouth/Throat: No oropharyngeal exudate or posterior oropharyngeal edema.  Eyes: Conjunctivae and lids are normal. Pupils are equal, round, and reactive to light.  Neck: No JVD present. Carotid bruit is not present. No edema present. No thyroid mass and no thyromegaly present.  Cardiovascular: S1 normal and S2 normal.  An irregularly irregular rhythm present. Tachycardia present.  Exam reveals no gallop.   Murmur heard.  Systolic murmur is present with a grade of 2/6  Pulses:      Dorsalis pedis pulses are 2+ on the right side, and 2+ on the left side.  Respiratory: No respiratory distress. She has decreased breath sounds in the right lower field and the left lower field. She has no wheezes. She has no rhonchi. She has no rales.  GI: Soft. Bowel sounds are normal. There is no tenderness.  Musculoskeletal:       Right ankle: She exhibits no swelling.       Left ankle: She exhibits no swelling.  Lymphadenopathy:    She has no cervical adenopathy.  Neurological:  Patient moving her arms on her own  Skin: Skin is warm. No rash noted. Nails show no clubbing.  Psychiatric:  Unable to assess with dementia      Data Reviewed: Basic Metabolic  Panel:  Recent Labs Lab 06/01/16 1855 06/02/16 0331 06/03/16 1304  NA 152* 153*  --   K 3.2* 3.1* 3.3*  CL 117* 123*  --   CO2 26 26  --   GLUCOSE 138* 138*  --   BUN 42* 37*  --   CREATININE 1.20* 1.14*  --   CALCIUM 8.7* 8.3*  --   MG 2.2  --   --   PHOS 3.5  --   --    Liver Function Tests:  Recent Labs Lab 06/01/16 1855  AST 26  ALT 23  ALKPHOS 167*  BILITOT 1.2  PROT 6.4*  ALBUMIN 3.1*   CBC:  Recent Labs Lab 06/01/16 1855 06/02/16 0331 06/02/16 0939 06/02/16 1820 06/03/16 0456 06/03/16 1304  WBC 7.1 5.3  --   --   --   --   HGB 12.4 10.7* 10.3* 11.4* 11.7* 11.4*  HCT 41.0 35.6  --   --   --   --   MCV 92.2 91.8  --   --   --   --   PLT 322 271  --   --   --   --    Cardiac Enzymes:  Recent Labs Lab 06/01/16 1855 06/02/16 0331 06/02/16 0938 06/02/16 1505  TROPONINI 0.06* 0.04* 0.04* 0.04*    Scheduled Meds: . carvedilol  6.25 mg  Oral BID WC  . enalapril  5 mg Oral Daily  . haloperidol  0.5 mg Oral BID  . levothyroxine  100 mcg Oral QAC breakfast  . memantine  10 mg Oral BID  . mirtazapine  22.5 mg Oral QHS  . pantoprazole (PROTONIX) IV  40 mg Intravenous Q12H  . QUEtiapine  50 mg Oral QHS  . rivastigmine  1.5 mg Oral BID WC  . sodium chloride flush  3 mL Intravenous Q12H  . venlafaxine XR  150 mg Oral Daily   Continuous Infusions: . amiodarone 30 mg/hr (06/03/16 0352)  . dextrose 5 % and 0.45 % NaCl with KCl 20 mEq/L 75 mL/hr at 06/02/16 0042    Assessment/Plan:  1. Hypotension and rapid atrial fibrillation. Amiodarone drip. Coreg if able to take. 2. Rectal bleeding likely diverticular in nature. No further bleeding. Hold Eliquis. 3. Agitation with dementia. Haldol during the day increase Seroquel at night. Avoid benzodiazepine. 4. Hypernatremia. On half-normal saline 5. Hypokalemia replace in IV fluids 6. Hypothyroidism unspecified on levothyroxine 7. Borderline troponin likely demand ischemia from rapid atrial fibrillation and  hypotension 8. History of heart failure with cardiomyopathy. No signs of heart failure currently. 9. Patient is a DO NOT RESUSCITATE. Spoke with the daughter on the phone. If she does not regain a good mental status her overall prognosis will be very poor and consider hospice home. I will try to get her sleeping tonight with increased dose of Seroquel.  Code Status:     Code Status Orders        Start     Ordered   06/02/16 0028  Do not attempt resuscitation (DNR)  Continuous    Question Answer Comment  In the event of cardiac or respiratory ARREST Do not call a "code blue"   In the event of cardiac or respiratory ARREST Do not perform Intubation, CPR, defibrillation or ACLS   In the event of cardiac or respiratory ARREST Use medication by any route, position, wound care, and other measures to relive pain and suffering. May use oxygen, suction and manual treatment of airway obstruction as needed for comfort.      06/02/16 0027    Code Status History    Date Active Date Inactive Code Status Order ID Comments User Context   06/01/2016 11:10 PM 06/02/2016 12:27 AM Full Code [id number]  Tonye RoyaltyAlexis Hugelmeyer, DO Inpatient   05/16/2016  2:38 PM 05/20/2016  6:17 PM DNR [id number]  Enid Baasadhika Kalisetti, MD Inpatient   03/16/2016 10:36 AM 03/17/2016  5:14 PM DNR [id number]  Auburn BilberryShreyang Patel, MD Inpatient   03/15/2016  1:08 AM 03/15/2016 10:36 AM Full Code [id number]  Oralia Manisavid Willis, MD Inpatient    Advance Directive Documentation   Flowsheet Row Most Recent Value  Type of Advance Directive  Out of facility DNR (pink MOST or yellow form) [pink MOST form]  Pre-existing out of facility DNR order (yellow form or pink MOST form)  Pink MOST form placed in chart (order not valid for inpatient use)  "MOST" Form in Place?  No data     Family Communication: Spoke with the daughter on the phone Disposition Plan: transfer to telemetry  Consultants:  Cardiology  Gastroenterology  Time spent: 25 minutes  Alford HighlandWIETING,  Iszabella Hebenstreit  [employer]

## 2016-06-03 NOTE — Consult Note (Signed)
ORTHOPAEDIC CONSULTATION  REQUESTING PHYSICIAN: Alford Highlandichard Wieting, MD  Chief Complaint:   Status post reduction and intramedullary nailing of left intertrochanteric hip fracture.  History of Present Illness: Heidi Miller is a 82 y.o. female with multiple medical problems who was admitted for an acute lower GI bleed with resultant hypotension. She is now 2.5 weeks status post a reduction and intramedullary nailing of an intertrochanteric left Fracture which was performed on 05/17/16. The patient apparently has not had any postoperative fevers and had been progressing well until she was brought to the emergency room yesterday morning with hypotension. Subsequent workup demonstrated a lower GI bleed, so she was admitted for further evaluation and treatment. The patient is demented, so it is difficult to obtain any history as it pertains to her left hip.  Past Medical History:  Diagnosis Date  . Atrial fibrillation (HCC)    on eliquis  . CAD (coronary artery disease)    s/p LAD stent  . CHF (congestive heart failure) (HCC)    systolic dysfunction, EF 20%  . CVA (cerebral vascular accident) (HCC)    in Sept 2017 with left internal capsule infarct  . Dementia   . Depression   . GERD (gastroesophageal reflux disease)   . Hypertension   . Ischemic cardiomyopathy   . Thyroid disease    Past Surgical History:  Procedure Laterality Date  . CARDIAC CATHETERIZATION    . CHOLECYSTECTOMY    . FOOT SURGERY    . HERNIA REPAIR    . INTRAMEDULLARY (IM) NAIL INTERTROCHANTERIC Left 05/17/2016   Procedure: INTRAMEDULLARY (IM) NAIL INTERTROCHANTRIC;  Surgeon: Christena FlakeJohn J Poggi, MD;  Location: [facility] ORS;  Service: Orthopedics;  Laterality: Left;  . resection of left acoustic neuroma    . Right rotator cuff repair    . TONSILLECTOMY     Social History   Social History  . Marital status: Widowed    Spouse name: N/A  . Number of children: N/A   . Years of education: N/A   Social History Main Topics  . Smoking status: Never Smoker  . Smokeless tobacco: Never Used  . Alcohol use No  . Drug use: No  . Sexual activity: Not Asked   Other Topics Concern  . None   Social History Narrative   Lives at home by herself. Has care during the day. Daughter visits every day. Has a walker to ambulate, patient forgets to use it however.   Family History  Problem Relation Age of Onset  . Lung cancer Mother   . Heart attack Father   . CAD Daughter    No Known Allergies Prior to Admission medications   Medication Sig Start Date End Date Taking? Authorizing Provider  apixaban (ELIQUIS) 2.5 MG TABS tablet Take 2.5 mg by mouth 2 (two) times daily.   Yes Historical Provider, MD  aspirin 81 MG chewable tablet Chew 81 mg by mouth every morning.   Yes Historical Provider, MD  cyanocobalamin (,VITAMIN B-12,) 1000 MCG/ML injection Inject 1,000 mcg into the muscle every 30 (thirty) days. On the 16th   Yes Historical Provider, MD  diltiazem (CARDIZEM CD) 120 MG 24 hr capsule Take 120 mg by mouth daily.   Yes Historical Provider, MD  docusate sodium (COLACE) 100 MG capsule Take 1 capsule (100 mg total) by mouth 2 (two) times daily. 05/20/16  Yes Katharina Caperima Vaickute, MD  furosemide (LASIX) 20 MG tablet Take 20 mg by mouth daily.   Yes Historical Provider, MD  levothyroxine (SYNTHROID, LEVOTHROID) 100  MCG tablet Take 100 mcg by mouth daily.   Yes Historical Provider, MD  memantine (NAMENDA) 10 MG tablet Take 10 mg by mouth 2 (two) times daily.   Yes Historical Provider, MD  metoprolol (LOPRESSOR) 50 MG tablet Take 25 mg by mouth daily.   Yes Historical Provider, MD  mirtazapine (REMERON) 45 MG tablet Take 22.5 mg by mouth at bedtime.   Yes Historical Provider, MD  mupirocin ointment (BACTROBAN) 2 % Place 1 application into the nose 2 (two) times daily. 05/20/16  Yes Katharina Caperima Vaickute, MD  omeprazole (PRILOSEC) 20 MG capsule Take 20 mg by mouth daily.   Yes  Historical Provider, MD  QUEtiapine (SEROQUEL XR) 50 MG TB24 24 hr tablet Take 25 mg by mouth every evening.    Yes Historical Provider, MD  rivastigmine (EXELON) 1.5 MG capsule Take 1.5 mg by mouth 2 (two) times daily with a meal. 1200 & 1700   Yes Historical Provider, MD  senna (SENOKOT) 8.6 MG TABS tablet Take 1 tablet (8.6 mg total) by mouth 2 (two) times daily. 05/20/16  Yes Katharina Caperima Vaickute, MD  spironolactone (ALDACTONE) 25 MG tablet Take 12.5 mg by mouth daily.   Yes Historical Provider, MD  venlafaxine XR (EFFEXOR-XR) 150 MG 24 hr capsule Take 150 mg by mouth daily.   Yes Historical Provider, MD  feeding supplement, ENSURE ENLIVE, (ENSURE ENLIVE) LIQD Take 237 mLs by mouth 2 (two) times daily between meals. 05/20/16   Katharina Caperima Vaickute, MD  polyethylene glycol (MIRALAX / GLYCOLAX) packet Take 17 g by mouth daily as needed for mild constipation. 05/20/16   Katharina Caperima Vaickute, MD   No results found.  Positive ROS: All other systems have been reviewed and were otherwise negative with the exception of those mentioned in the HPI and as above.  Physical Exam: General:  Resting comfortably, no acute distress Psychiatric:  Patient is confused   Cardiovascular:  No pedal edema Respiratory:  No wheezing, non-labored breathing GI:  Abdomen is soft and non-tender Skin:  No lesions in the area of chief complaint Neurologic:  Sensation intact distally Lymphatic:  No axillary or cervical lymphadenopathy  Orthopedic Exam:  Orthopedic examination is limited to the left hip and lower extremity. Each of the 3 incisions appear to be well-healed and without evidence for infection. There is some localized erythema around each of the staples, which are still in place, but there is no drainage or warmth around any of the sites to suggest infection. According to the nurse, the patient is moving her lower extremity without difficulty or reservation. The patient has good capillary refill to her foot, and sensation appears  to be intact to light touch to all distributions of her left lower extremity.  X-rays:  No recent x-rays since her initial postoperative films.  Assessment: Status post reduction and intramedullary nailing of intertrochanteric fracture left hip.  Plan: At this point, the patient's wounds appear to be healing well. It is time to have her staples removed, some of the nurse will remove the staples and apply Steri-Strips to each of the 3 wounds. If the patient recovers sufficiently from her recent medical issues, she may resume physical therapy, weightbearing as tolerating on the left lower extremity. She is to follow-up in my office in another 3-4 weeks as scheduled, at which time we will obtain repeat x-rays of the left hip and femur.  Thank you for asking me to participate in the care of this most unfortunate woman. Please let me know if I can  be of any further service while she is hospitalized.   Maryagnes Amos, MD  Beeper #:  [phone]  06/03/2016 10:21 AM

## 2016-06-03 NOTE — Progress Notes (Signed)
Very restless and agitated today. Rubbed out AC iv. Restarted in left forearm after 4 attempts.Daughter in to visit. Passed swallow evaluation but has such a short attention span hard to keep her focused enough to swallow. Incontinent of stool and urine.Dr Hilton SinclairWeiting ordered a Pallative care consult  LDAtype: A list of LDA types or LDA group IDs to display, separated by commas. Preface group IDs with X. showremoved: Determines whether removed LDAs appear. Leave the parameter blank to hide removed LDAs. Enter 1 to show removed LDAs. showdays: This is a two piece comma-delimited parameter that determines whether the number of days since the assessment appears.  Enter 1 in the first piece to show the number of days since insertion/assessment. Leave the parameter blank to hide the number of days since insertion/assessment.  Enter 1 in the second piece to calculate the number of days using calendar days. Leave the parameter blank to calculate the number of days in 24 hour periods. This piece is ignored if the first piece is blank. displaymode: Determines what kind of LDA data is displayed.  Enter 0 or leave it blank to display LDA assessment data.  Enter 1 to display LDA property data.  Enter 2 to only display LDA names. lookbackhours: This filters LDAs by only showing the LDAs with assessment data filed or pended within the specified number of hours. Assessment rows without data filed or pended in the timeframe will not appear.  Enter the number of hours to look back from the current time. onlycurrentencounter: This filters LDA assessments by only showing assessment data filed or pended within the current encounter. Assessments with data filed or pended in previous related encounters will not appear.  Enter 0 or leave it blank to display LDA assessment data across all related encounters.  Enter 1 to display only LDA data documented within the current encounter.  Examples:  Enter LDA[7 to show assessment  data for all LDA groups with a type of 7.  Enter LDA[X10:1:1,1 to show data for LDA group 10, show removed LDAs, and calculate the number of days using calendar days.  Enter LDA[4:::1 to show property data for all LDA groups with a type of 4.  Enter LDA[4:::1:2 to show property data for all LDA groups with a type of 4 that have assessment data from within the past 2 hours.  Enter LDA[4:::::1 to show assessment data for all LDA groups with a type of 4 with assessment data from the current encounter. Hilton Sinclair. Weiting .

## 2016-06-03 NOTE — Progress Notes (Signed)
  Midge Miniumarren Aletha Allebach, MD [facility]   [address] [place], [place] [address] Phone: [phone] Fax : [phone]   Subjective: Patient with Hb increased without any further sign of bleeding. Concern for possible C.diff patient still unable to give much history.   Objective: Vital signs in last 24 hours: Vitals:   06/03/16 0500 06/03/16 0600 06/03/16 0900 06/03/16 0915  BP:  (!) 117/103    Pulse: (!) 106 (!) 110    Resp: 20 17    Temp: 98.4 F (36.9 C)     TempSrc: Oral     SpO2: 96% 100% 93% 92%  Weight:      Height:       Weight change:   Intake/Output Summary (Last 24 hours) at 06/03/16 1149 Last data filed at 06/03/16 0900  Gross per 24 hour  Intake           1910.5 ml  Output                1 ml  Net           1909.5 ml     Exam: Tachy Alert Confused   Lab Results: @LABTEST2 @ Micro Results: No results found for this or any previous visit (from the past 240 hour(s)). Studies/Results: No results found. Medications: I have reviewed the patient's current medications. Scheduled Meds: . carvedilol  6.25 mg Oral BID WC  . enalapril  5 mg Oral Daily  . levothyroxine  100 mcg Oral QAC breakfast  . memantine  10 mg Oral BID  . mirtazapine  22.5 mg Oral QHS  . pantoprazole (PROTONIX) IV  40 mg Intravenous Q12H  . QUEtiapine  25 mg Oral QHS  . rivastigmine  1.5 mg Oral BID WC  . sodium chloride flush  3 mL Intravenous Q12H  . venlafaxine XR  150 mg Oral Daily   Continuous Infusions: . amiodarone 30 mg/hr (06/03/16 0352)  . dextrose 5 % and 0.45 % NaCl with KCl 20 mEq/L 75 mL/hr at 06/02/16 0042   PRN Meds:.acetaminophen **OR** acetaminophen, bisacodyl, HYDROcodone-acetaminophen, magnesium citrate, ondansetron **OR** ondansetron (ZOFRAN) IV, senna-docusate, zolpidem   Assessment: Active Problems:   A-fib (HCC)   Lower GI bleed   Gastrointestinal hemorrhage    Plan: No further sign of GI bleeding.  Hb up today without transfusion. Nothing further  to add from a GI point of view. C. Diff ordered.   LOS: 2 days   Midge MiniumDarren Alden Feagan 06/03/2016, 11:49 AM

## 2016-06-03 NOTE — Progress Notes (Addendum)
SUBJECTIVE: Patient is disoriented and quite agitated in the bed. She responds to question of pain that she is hurting in her legs.   Vitals:   06/03/16 0300 06/03/16 0400 06/03/16 0500 06/03/16 0600  BP: (!) 92/48 (!) 102/59  (!) 117/103  Pulse: (!) 104 (!) 103 (!) 106 (!) 110  Resp: (!) 23 (!) 25 20 17   Temp:   98.4 F (36.9 C)   TempSrc:   Oral   SpO2: 99% 100% 96% 100%  Weight:      Height:        Intake/Output Summary (Last 24 hours) at 06/03/16 0857 Last data filed at 06/03/16 0600  Gross per 24 hour  Intake           2290.4 ml  Output                1 ml  Net           2289.4 ml    LABS: Basic Metabolic Panel:  Recent Labs  04/54/0911/26/17 1855 06/02/16 0331  NA 152* 153*  K 3.2* 3.1*  CL 117* 123*  CO2 26 26  GLUCOSE 138* 138*  BUN 42* 37*  CREATININE 1.20* 1.14*  CALCIUM 8.7* 8.3*  MG 2.2  --   PHOS 3.5  --    Liver Function Tests:  Recent Labs  06/01/16 1855  AST 26  ALT 23  ALKPHOS 167*  BILITOT 1.2  PROT 6.4*  ALBUMIN 3.1*   No results for input(s): LIPASE, AMYLASE in the last 72 hours. CBC:  Recent Labs  06/01/16 1855 06/02/16 0331  06/02/16 1820 06/03/16 0456  WBC 7.1 5.3  --   --   --   HGB 12.4 10.7*  < > 11.4* 11.7*  HCT 41.0 35.6  --   --   --   MCV 92.2 91.8  --   --   --   PLT 322 271  --   --   --   < > = values in this interval not displayed. Cardiac Enzymes:  Recent Labs  06/02/16 0331 06/02/16 0938 06/02/16 1505  TROPONINI 0.04* 0.04* 0.04*   BNP: Invalid input(s): POCBNP D-Dimer: No results for input(s): DDIMER in the last 72 hours. Hemoglobin A1C: No results for input(s): HGBA1C in the last 72 hours. Fasting Lipid Panel: No results for input(s): CHOL, HDL, LDLCALC, TRIG, CHOLHDL, LDLDIRECT in the last 72 hours. Thyroid Function Tests: No results for input(s): TSH, T4TOTAL, T3FREE, THYROIDAB in the last 72 hours.  Invalid input(s): FREET3 Anemia Panel: No results for input(s): VITAMINB12, FOLATE, FERRITIN,  TIBC, IRON, RETICCTPCT in the last 72 hours.   PHYSICAL EXAM General: Thin, agitated HEENT:  Normocephalic and atramatic Neck:  No JVD.  Lungs: Clear bilaterally to auscultation and percussion. Heart: HRRR . Normal S1 and S2 without gallops or murmurs.  Abdomen: Bowel sounds are positive, abdomen soft and non-tender  Msk:  Back normal, normal gait. Decrease muscle mass. Extremities: No clubbing, cyanosis or edema.   Neuro: Alert and oriented X 3. Psych:  Agitated and confused  TELEMETRY: Atrial fibrillation with rates in the 120's  ASSESSMENT AND PLAN: Patient admitted with GI bleed on Eliquis which has been discontinued. She has a history of atrial fibrillation and is now with rapid ventricular response. IV amiodarone has been initiated. Blood pressure is stable. Her echocardiogram shows severe LV dysfunction with EF 5%, severe pulmonary hypertension and 4 chamber dilitation. She is of advanced age and advanced dimentia and thus  will treat medically. Will add carvedilol and ace-I.  Active Problems:   Lower GI bleed   Gastrointestinal hemorrhage    Berton BonJanine Armanda Forand, NP 06/03/2016 8:57 AM

## 2016-06-04 DIAGNOSIS — R451 Restlessness and agitation: Secondary | ICD-10-CM

## 2016-06-04 DIAGNOSIS — R52 Pain, unspecified: Secondary | ICD-10-CM

## 2016-06-04 DIAGNOSIS — Z515 Encounter for palliative care: Secondary | ICD-10-CM

## 2016-06-04 LAB — CBC
HCT: 34.2 % — ABNORMAL LOW (ref 35.0–47.0)
Hemoglobin: 10.4 g/dL — ABNORMAL LOW (ref 12.0–16.0)
MCH: 28 pg (ref 26.0–34.0)
MCHC: 30.4 g/dL — AB (ref 32.0–36.0)
MCV: 92.2 fL (ref 80.0–100.0)
PLATELETS: 253 10*3/uL (ref 150–440)
RBC: 3.71 MIL/uL — ABNORMAL LOW (ref 3.80–5.20)
RDW: 25 % — AB (ref 11.5–14.5)
WBC: 6.5 10*3/uL (ref 3.6–11.0)

## 2016-06-04 LAB — BASIC METABOLIC PANEL
ANION GAP: 5 (ref 5–15)
BUN: 22 mg/dL — AB (ref 6–20)
CO2: 22 mmol/L (ref 22–32)
CREATININE: 1.11 mg/dL — AB (ref 0.44–1.00)
Calcium: 8.3 mg/dL — ABNORMAL LOW (ref 8.9–10.3)
Chloride: 121 mmol/L — ABNORMAL HIGH (ref 101–111)
GFR calc Af Amer: 49 mL/min — ABNORMAL LOW (ref >60)
GFR, EST NON AFRICAN AMERICAN: 42 mL/min — AB (ref >60)
GLUCOSE: 212 mg/dL — AB (ref 65–99)
Potassium: 4.4 mmol/L (ref 3.5–5.1)
Sodium: 148 mmol/L — ABNORMAL HIGH (ref 135–145)

## 2016-06-04 MED ORDER — MORPHINE SULFATE (CONCENTRATE) 10 MG/0.5ML PO SOLN
5.0000 mg | Freq: Four times a day (QID) | ORAL | Status: DC
  Administered 2016-06-04 – 2016-06-05 (×3): 5 mg via ORAL
  Filled 2016-06-04 (×3): qty 1

## 2016-06-04 MED ORDER — GLYCOPYRROLATE 0.2 MG/ML IJ SOLN: 0.2000 mg | INTRAMUSCULAR | Status: DC | PRN

## 2016-06-04 MED ORDER — HALOPERIDOL LACTATE 2 MG/ML PO CONC
5.0000 mg | Freq: Four times a day (QID) | ORAL | Status: DC | PRN
  Administered 2016-06-04 – 2016-06-05 (×2): 5 mg via ORAL
  Filled 2016-06-04 (×3): qty 2.5

## 2016-06-04 MED ORDER — BISACODYL 10 MG RE SUPP: 10.0000 mg | Freq: Every day | RECTAL | Status: DC | PRN

## 2016-06-04 MED ORDER — MORPHINE SULFATE (CONCENTRATE) 10 MG/0.5ML PO SOLN
5.0000 mg | ORAL | Status: DC | PRN
  Administered 2016-06-05: 5 mg via ORAL
  Filled 2016-06-04: qty 1

## 2016-06-04 MED ORDER — MORPHINE SULFATE (CONCENTRATE) 10 MG/0.5ML PO SOLN
5.0000 mg | ORAL | Status: DC | PRN
  Administered 2016-06-04: 5 mg via ORAL
  Filled 2016-06-04: qty 1

## 2016-06-04 MED ORDER — MORPHINE SULFATE (CONCENTRATE) 10 MG/0.5ML PO SOLN
10.0000 mg | ORAL | Status: DC | PRN
  Administered 2016-06-04: 10 mg via ORAL
  Filled 2016-06-04: qty 1

## 2016-06-04 MED ORDER — HALOPERIDOL LACTATE 5 MG/ML IJ SOLN
2.0000 mg | Freq: Four times a day (QID) | INTRAMUSCULAR | Status: DC | PRN
Start: 2016-06-04 — End: 2016-06-04
  Filled 2016-06-04: qty 1

## 2016-06-04 NOTE — Progress Notes (Signed)
D/c telemetry and d/c PIV d/t occlusion.   Patient is alert and disoriented x4.  Very agitated and wearing mittens at this time.  Haldol and morphine given PRN

## 2016-06-04 NOTE — Consult Note (Signed)
Consultation Note Date: 06/04/2016   Patient Name: Heidi Miller  DOB: Mar 07, 1923  MRN: [id number]  Age / Sex: 82 y.o., female  PCP: Derinda Late, MD Referring Physician: Loletha Grayer, MD  Reason for Consultation: Establishing goals of care  HPI/Patient Profile: 82 y.o. female  with past medical history of ischemic cardiomyopathy, hypertension, GERD, depression, dementia, CVA, CHF with EF of 5%, CAD, atrial fibrillation on eliquis, and thyroid disease admitted on 06/01/2016 with rectal bleeding, increased lethargy, and decreased appetite. Recent admit for fall s/p reduction and intramedullary nailing of an intertrochanteric left fracture on 05/17/16. GI consulted-Hgb stable with no further signs of bleeding. Eliquis on hold. Cardiology consulted for afib RVR-on amiodarone infusion. ECHO from 11/27 revealing EF 5%, severe pulmonary hypertension, and 4 chamber dilitation. Also with hypotension requiring fluid boluses. Patient has dementia with agitation requiring haldol and Seroquel HS. Overall, poor prognosis per attending. Palliative medicine consultation for goals of care.   Clinical Assessment and Goals of Care: Met with patient and daughter at bedside. Patient intermittently moaning with incomprehensible speech. Disoriented and not following commands. Introduced palliative medicine to daughter. Daughter tells me of patients rapid decline since left hip fracture three weeks ago. She went to [facility] for rehab as was doing well at first but with poor appetite and increased confusion in the last week. Leda Gauze also speaks of her stroke in September which has contributed in her declining health.   Discussed current medical status and co-morbidities (ischemic cardiomyopathy, CHF, afib, dementia) that are contributing to declining status. Leda Gauze initially tells me she was hopeful that she would eventually be able to  return home after hip surgery but she is realistic that her mother may be near the end of her life. Discussed aggressive medical interventions she has been receiving during hospitalization and how she is not showing improvements despite interventions. Discussed continuing aggressive medical interventions  vs. Shifting to a comfort pathway. Leda Gauze doesn't want to loose her mother but also tells me she is not going to be selfish and allow her to continue suffering. She wants her mother to be comfortable and allow nature to take its course. Leda Gauze, and her mom, have a strong faith that the Reita Cliche is in control and this is his will.   Discussed in detail comfort measures, no escalation of care, residential hospice, symptom management, and expectations at EOL. Daughter agrees with residential hospice and understands anything could happen at any time. She is at peace and knows her mother is on the way to her next destination, heaven. Answered questions. Emotional and spiritual support given.      HCPOA-daughter Leda Gauze)   SUMMARY OF RECOMMENDATIONS    DNR/DNI  Comfort measures only. Discontinued medications/interventions not aimed at comfort.   Social work consult for residential hospice.  Dr. Leslye Peer updated.   Symptom management--see below.   PMT will f/u with patient and family tomorrow, 11/30.   Code Status/Advance Care Planning:  DNR   Symptom Management:   Scheduled Roxanol 82m PO q6h  Roxanol 59mPO  q2h prn pain/dyspnea/air hunger  Haldol 69m PO q6h prn agitation  Robinul 0.253mIV q4h prn secretions  Dulcolax suppository 1086maily prn constipation  Palliative Prophylaxis:   Aspiration, Delirium Protocol, Oral Care and Turn Reposition  Additional Recommendations (Limitations, Scope, Preferences):  Full Comfort Care  Psycho-social/Spiritual:   Desire for further Chaplaincy support:no  Additional Recommendations: Caregiving  Support/Resources and Education on  Hospice  Prognosis:   < 2 weeks   Discharge Planning: Hospice facility      Primary Diagnoses: Present on Admission: . Lower GI bleed . [facility] have reviewed the medical record, interviewed the patient and family, and examined the patient. The following aspects are pertinent.  Past Medical History:  Diagnosis Date  . Atrial [place] ([facility] [place]  on eliquis  . CAD (coronary artery disease)    s/p LAD stent  . CHF (congestive heart failure) (HCC)    systolic dysfunction, EF 20%31% CVA (cerebral vascular accident) ([facility]  in Sept 2017 with left internal capsule infarct  . Dementia   . Depression   . GERD (gastroesophageal reflux disease)   . Hypertension   . Ischemic cardiomyopathy   . Thyroid disease    Social History   Social History  . Marital status: Widowed    Spouse name: N/A  . Number of children: N/A  . Years of education: N/A   Social History Main Topics  . Smoking status: Never Smoker  . Smokeless tobacco: Never Used  . Alcohol use No  . Drug use: No  . Sexual activity: Not Asked   Other Topics Concern  . None   Social History Narrative   Lives at home by herself. Has care during the day. Daughter visits every day. Has a walker to ambulate, patient forgets to use it however.   Family History  Problem Relation Age of Onset  . Lung cancer Mother   . Heart attack Father   . CAD Daughter    Scheduled Meds: . haloperidol  0.5 mg Oral BID  . QUEtiapine  50 mg Oral QHS  . sodium chloride flush  3 mL Intravenous Q12H   Continuous Infusions:  PRN Meds:.acetaminophen **OR** acetaminophen, bisacodyl, glycopyrrolate, haloperidol lactate, HYDROcodone-acetaminophen, magnesium citrate, morphine CONCENTRATE, ondansetron **OR** ondansetron (ZOFRAN) IV Medications Prior to Admission:  Prior to Admission medications   Medication Sig Start Date End Date Taking? Authorizing Provider  apixaban (ELIQUIS) 2.5 MG TABS tablet Take 2.5 mg by mouth 2 (two)  times daily.   Yes Historical Provider, MD  aspirin 81 MG chewable tablet Chew 81 mg by mouth every morning.   Yes Historical Provider, MD  cyanocobalamin (,VITAMIN B-12,) 1000 MCG/ML injection Inject 1,000 mcg into the muscle every 30 (thirty) days. On the 16th   Yes Historical Provider, MD  diltiazem (CARDIZEM CD) 120 MG 24 hr capsule Take 120 mg by mouth daily.   Yes Historical Provider, MD  docusate sodium (COLACE) 100 MG capsule Take 1 capsule (100 mg total) by mouth 2 (two) times daily. 05/20/16  Yes RimTheodoro GristD  furosemide (LASIX) 20 MG tablet Take 20 mg by mouth daily.   Yes Historical Provider, MD  levothyroxine (SYNTHROID, LEVOTHROID) 100 MCG tablet Take 100 mcg by mouth daily.   Yes Historical Provider, MD  memantine (NAMENDA) 10 MG tablet Take 10 mg by mouth 2 (two) times daily.   Yes Historical Provider, MD  metoprolol (LOPRESSOR) 50 MG tablet Take 25 mg by mouth daily.  Yes Historical Provider, MD  mirtazapine (REMERON) 45 MG tablet Take 22.5 mg by mouth at bedtime.   Yes Historical Provider, MD  mupirocin ointment (BACTROBAN) 2 % Place 1 application into the nose 2 (two) times daily. 05/20/16  Yes Theodoro Grist, MD  omeprazole (PRILOSEC) 20 MG capsule Take 20 mg by mouth daily.   Yes Historical Provider, MD  QUEtiapine (SEROQUEL XR) 50 MG TB24 24 hr tablet Take 25 mg by mouth every evening.    Yes Historical Provider, MD  rivastigmine (EXELON) 1.5 MG capsule Take 1.5 mg by mouth 2 (two) times daily with a meal. 1200 & 1700   Yes Historical Provider, MD  senna (SENOKOT) 8.6 MG TABS tablet Take 1 tablet (8.6 mg total) by mouth 2 (two) times daily. 05/20/16  Yes Theodoro Grist, MD  spironolactone (ALDACTONE) 25 MG tablet Take 12.5 mg by mouth daily.   Yes Historical Provider, MD  venlafaxine XR (EFFEXOR-XR) 150 MG 24 hr capsule Take 150 mg by mouth daily.   Yes Historical Provider, MD  feeding supplement, ENSURE ENLIVE, (ENSURE ENLIVE) LIQD Take 237 mLs by mouth 2 (two) times daily  between meals. 05/20/16   Theodoro Grist, MD  polyethylene glycol (MIRALAX / GLYCOLAX) packet Take 17 g by mouth daily as needed for mild constipation. 05/20/16   Theodoro Grist, MD   No Known Allergies Review of Systems  Unable to perform ROS: Dementia   Physical Exam  Constitutional: She is easily aroused.  Cardiovascular: An irregularly irregular rhythm present. Exam reveals distant heart sounds.   Pulmonary/Chest: No accessory muscle usage. No respiratory distress. She has decreased breath sounds.  Abdominal: Soft. Bowel sounds are decreased.  Neurological: She is easily aroused. She is disoriented.  Skin: Skin is warm and dry.  Psychiatric: Cognition and memory are impaired. She is noncommunicative. She is inattentive.  Nursing note and vitals reviewed.  Vital Signs: BP (!) 121/102 (BP Location: Left Leg)   Pulse 92   Temp 97.9 F (36.6 C) (Oral)   Resp 18   Ht 5' 3" (1.6 m)   Wt 62.7 kg (138 lb 3.2 oz)   SpO2 (!) 80%   BMI 24.48 kg/m  Pain Assessment: No/denies pain   Pain Score: 0-No pain  SpO2: SpO2: (!) 80 % O2 Device:SpO2: (!) 80 % O2 Flow Rate: .O2 Flow Rate (L/min): 2.5 L/min  IO: Intake/output summary:   Intake/Output Summary (Last 24 hours) at 06/04/16 1521 Last data filed at 06/04/16 0500  Gross per 24 hour  Intake           1231.3 ml  Output                0 ml  Net           1231.3 ml   LBM: Last BM Date: 06/03/16 Baseline Weight: Weight: 64.9 kg (143 lb) Most recent weight: Weight: 62.7 kg (138 lb 3.2 oz)     Palliative Assessment/Data: PPS 20%   Flowsheet Rows   Flowsheet Row Most Recent Value  Intake Tab  Referral Department  Hospitalist  Unit at Time of Referral  Med/Surg Unit  Palliative Care Primary Diagnosis  Cardiac  Palliative Care Type  New Palliative care  Reason for referral  Clarify Goals of Care  Date of Admission  06/01/16  Date first seen by Palliative Care  06/04/16  Clinical Assessment  Palliative Performance Scale Score   20%  Psychosocial & Spiritual Assessment  Palliative Care Outcomes  Patient/Family meeting held?  Yes  Who was at the meeting?  daughter  Palliative Care Outcomes  Improved pain interventions, Improved non-pain symptom therapy, Clarified goals of care, Counseled regarding hospice, Provided end of life care assistance, Provided psychosocial or spiritual support, Changed to focus on comfort, Transitioned to hospice     Time In: 1215 Time Out: 1345 Time Total: 38mn Greater than 50%  of this time was spent counseling and coordinating care related to the above assessment and plan.  Signed by:  MIhor Dow FNP-C Palliative Medicine Team  Phone: [phone]: [phone]: [phone]  Please contact Palliative Medicine Team phone at [phone] questions and concerns.  For individual provider: See AShea Evans

## 2016-06-04 NOTE — Care Management (Signed)
[facility]

## 2016-06-04 NOTE — Progress Notes (Addendum)
SUBJECTIVE: Patient resting more quietly in bed than yesterday.   Vitals:   06/03/16 1929 06/04/16 0447 06/04/16 0455 06/04/16 0542  BP: 114/78 (!) 92/59 (!) 148/129 120/79  Pulse: (!) 187 91 78 89  Resp: 18 18    Temp: 98.1 F (36.7 C) 97.9 F (36.6 C)    TempSrc: Oral Oral    SpO2: (!) 86% (!) 80%    Weight:      Height:        Intake/Output Summary (Last 24 hours) at 06/04/16 0932 Last data filed at 06/04/16 0500  Gross per 24 hour  Intake           1781.5 ml  Output                0 ml  Net           1781.5 ml    LABS: Basic Metabolic Panel:  Recent Labs  11/91/4711/26/17 1855 06/02/16 0331 06/03/16 1304 06/04/16 0708  NA 152* 153*  --  148*  K 3.2* 3.1* 3.3* 4.4  CL 117* 123*  --  121*  CO2 26 26  --  22  GLUCOSE 138* 138*  --  212*  BUN 42* 37*  --  22*  CREATININE 1.20* 1.14*  --  1.11*  CALCIUM 8.7* 8.3*  --  8.3*  MG 2.2  --   --   --   PHOS 3.5  --   --   --    Liver Function Tests:  Recent Labs  06/01/16 1855  AST 26  ALT 23  ALKPHOS 167*  BILITOT 1.2  PROT 6.4*  ALBUMIN 3.1*   No results for input(s): LIPASE, AMYLASE in the last 72 hours. CBC:  Recent Labs  06/02/16 0331  06/03/16 1304 06/04/16 0708  WBC 5.3  --   --  6.5  HGB 10.7*  < > 11.4* 10.4*  HCT 35.6  --   --  34.2*  MCV 91.8  --   --  92.2  PLT 271  --   --  253  < > = values in this interval not displayed. Cardiac Enzymes:  Recent Labs  06/02/16 0331 06/02/16 0938 06/02/16 1505  TROPONINI 0.04* 0.04* 0.04*   BNP: Invalid input(s): POCBNP D-Dimer: No results for input(s): DDIMER in the last 72 hours. Hemoglobin A1C: No results for input(s): HGBA1C in the last 72 hours. Fasting Lipid Panel: No results for input(s): CHOL, HDL, LDLCALC, TRIG, CHOLHDL, LDLDIRECT in the last 72 hours. Thyroid Function Tests: No results for input(s): TSH, T4TOTAL, T3FREE, THYROIDAB in the last 72 hours.  Invalid input(s): FREET3 Anemia Panel: No results for input(s): VITAMINB12,  FOLATE, FERRITIN, TIBC, IRON, RETICCTPCT in the last 72 hours.   PHYSICAL EXAM General: Chronically ill-appearing HEENT:  Normocephalic and atramatic Neck:  No JVD.  Lungs: Clear bilaterally to auscultation and percussion. Heart: HRRR . Normal S1 and S2 without gallops or murmurs.  Msk:  Decreased muscle mass Extremities: No clubbing, cyanosis or edema.   Neuro: Did not arouse due to recent agitation Psych:  Did not arouse due to recent agitation  TELEMETRY: Atrial fibrillation with rates in the 90s to 100s  ASSESSMENT AND PLAN: Patient admitted with GI bleed on Eliquis which has been discontinued. She has a history of atrial fibrillation and developed rapid ventricular response. IV amiodarone has been initiated and her heart rate has come down to the 90s.  Her echocardiogram shows severe LV dysfunction with EF 5%, severe pulmonary  hypertension and 4 chamber dilitation. She is of advanced age and advanced dimentia and has a poor prognosis and thus will treat medically.  Active Problems:   A-fib [facility](HCC)   Lower GI bleed   Gastrointestinal hemorrhage    Berton BonJanine Ylianna Almanzar, NP 06/04/2016 9:32 AM

## 2016-06-04 NOTE — Clinical Social Work Note (Addendum)
MSW received referral from palliative medicine requesting patient to go to hospice facility.  MSW attempted to call patient's daughter and discuss options for hospice facility.  MSW was not able to leave a message on patient's daughter phone, MSW attempted to call home phone as well and no voice mail.  MSW to continue to attempt to get a hold of patient's family.  3:30pm  MSW spoke to patient's daughter who was at bedside to discuss hospice house placement.  MSW spoke to patient's daughter and she stated she [facility], MSW contacted [facility] who will review patient's information.  [facility] to meet with family and discuss hospice house placement.  4:30pm  MSW spoke to [facility] nurse liason, [facility].  MSW to continue to follow patient's progress.  Ervin KnackEric R. Theodosia Bahena, MSW [phone]  Mon-Fri 8a-4:30p 06/04/2016 2:24 PM

## 2016-06-04 NOTE — Progress Notes (Signed)
Patient ID: Idolina PrimerMavis W Burkard, female   DOB: [birth year], 82 y.o.   MRN: [id number]  [facility] PROGRESS NOTE  Idolina PrimerMavis W Depaul WUJ:[id number] DOB: [birth year] DOA: 06/01/2016 PCP: Rozanna BoxBABAOFF, MARC E, MD  HPI/Subjective: Patient again seen with her legs over the side rail. She pulled off her left mitten. Unable to get any history from her. As per the nurse she was unable to get any medications in her today.  Objective: Vitals:   06/04/16 0542 06/04/16 1207  BP: 120/79 (!) 121/102  Pulse: 89 92  Resp:    Temp:      Filed Weights   06/01/16 1704 06/01/16 2312  Weight: 64.9 kg (143 lb) 62.7 kg (138 lb 3.2 oz)    ROS: Review of Systems  Unable to perform ROS: Dementia   Exam: Physical Exam  HENT:  Nose: No mucosal edema.  Mouth/Throat: No oropharyngeal exudate or posterior oropharyngeal edema.  Eyes: Conjunctivae and lids are normal. Pupils are equal, round, and reactive to light.  Neck: No JVD present. Carotid bruit is not present. No edema present. No thyroid mass and no thyromegaly present.  Cardiovascular: S1 normal and S2 normal.  An irregularly irregular rhythm present. Tachycardia present.  Exam reveals no gallop.   Murmur heard.  Systolic murmur is present with a grade of 2/6  Pulses:      Dorsalis pedis pulses are 2+ on the right side, and 2+ on the left side.  Respiratory: No respiratory distress. She has decreased breath sounds in the right lower field and the left lower field. She has no wheezes. She has no rhonchi. She has no rales.  GI: Soft. Bowel sounds are normal. There is no tenderness.  Musculoskeletal:       Right ankle: She exhibits no swelling.       Left ankle: She exhibits no swelling.  Lymphadenopathy:    She has no cervical adenopathy.  Neurological:  Patient moving her arms on her own  Skin: Skin is warm. Nails show no clubbing.  Large bruise left inner thigh present on admission  Psychiatric:  Unable to assess with dementia      Data  Reviewed: Basic Metabolic Panel:  Recent Labs Lab 06/01/16 1855 06/02/16 0331 06/03/16 1304 06/04/16 0708  NA 152* 153*  --  148*  K 3.2* 3.1* 3.3* 4.4  CL 117* 123*  --  121*  CO2 26 26  --  22  GLUCOSE 138* 138*  --  212*  BUN 42* 37*  --  22*  CREATININE 1.20* 1.14*  --  1.11*  CALCIUM 8.7* 8.3*  --  8.3*  MG 2.2  --   --   --   PHOS 3.5  --   --   --    Liver Function Tests:  Recent Labs Lab 06/01/16 1855  AST 26  ALT 23  ALKPHOS 167*  BILITOT 1.2  PROT 6.4*  ALBUMIN 3.1*   CBC:  Recent Labs Lab 06/01/16 1855 06/02/16 0331 06/02/16 0939 06/02/16 1820 06/03/16 0456 06/03/16 1304 06/04/16 0708  WBC 7.1 5.3  --   --   --   --  6.5  HGB 12.4 10.7* 10.3* 11.4* 11.7* 11.4* 10.4*  HCT 41.0 35.6  --   --   --   --  34.2*  MCV 92.2 91.8  --   --   --   --  92.2  PLT 322 271  --   --   --   --  253  Cardiac Enzymes:  Recent Labs Lab 06/01/16 1855 06/02/16 0331 06/02/16 0938 06/02/16 1505  TROPONINI 0.06* 0.04* 0.04* 0.04*    Scheduled Meds: . haloperidol  0.5 mg Oral BID  . QUEtiapine  50 mg Oral QHS  . sodium chloride flush  3 mL Intravenous Q12H    Assessment/Plan:  1. Hypotension and rapid atrial fibrillation.  2. Rectal bleeding likely diverticular in nature. 3. Agitation with dementia. Haldol during the day increase Seroquel at night.  4. Hypernatremia. Since the patient is not eating she will be unable to keep up with her nutritional needs 5. Hypokalemia replaced 6. Hypothyroidism unspecified. 7. Borderline troponin likely demand ischemia from rapid atrial fibrillation and hypotension 8. History of heart failure with cardiomyopathy. Last echocardiogram shows an EF of 5%. 9. Patient is a DO NOT RESUSCITATE. Palliative care team and the patient comfort care. We'll get Chief Financial Officerchem manager consultation for hospice home. Comfort care measures ordered  Code Status:     Code Status Orders        Start     Ordered   06/02/16 0028  Do not attempt  resuscitation (DNR)  Continuous    Question Answer Comment  In the event of cardiac or respiratory ARREST Do not call a "code blue"   In the event of cardiac or respiratory ARREST Do not perform Intubation, CPR, defibrillation or ACLS   In the event of cardiac or respiratory ARREST Use medication by any route, position, wound care, and other measures to relive pain and suffering. May use oxygen, suction and manual treatment of airway obstruction as needed for comfort.      06/02/16 0027    Code Status History    Date Active Date Inactive Code Status Order ID Comments User Context   06/01/2016 11:10 PM 06/02/2016 12:27 AM Full Code [id number]  Tonye RoyaltyAlexis Hugelmeyer, DO Inpatient   05/16/2016  2:38 PM 05/20/2016  6:17 PM DNR [id number]  Enid Baasadhika Kalisetti, MD Inpatient   03/16/2016 10:36 AM 03/17/2016  5:14 PM DNR [id number]  Auburn BilberryShreyang Patel, MD Inpatient   03/15/2016  1:08 AM 03/15/2016 10:36 AM Full Code [id number]  Oralia Manisavid Willis, MD Inpatient    Advance Directive Documentation   Flowsheet Row Most Recent Value  Type of Advance Directive  Out of facility DNR (pink MOST or yellow form) [pink MOST form]  Pre-existing out of facility DNR order (yellow form or pink MOST form)  Pink MOST form placed in chart (order not valid for inpatient use)  "MOST" Form in Place?  No data     Family Communication: Spoke with the daughter on the phone Disposition Plan: Potential discharge to hospice home when bed available  Consultants:  Cardiology  Gastroenterology  Palliative care  Time spent: 25 minutes.   Alford HighlandWIETING, Britnie Colville  [employer]

## 2016-06-04 NOTE — Progress Notes (Signed)
Chaplain was requested by the nurse to visit with pt in room 250. Visited with family because pt was asleep and being moved to hospice care. Provided the ministry of prayer to the family.    06/04/16 1620  Clinical Encounter Type  Visited With Patient;Patient and family together  Visit Type Initial;Spiritual support  Referral From Nurse  Consult/Referral To Chaplain  Spiritual Encounters  Spiritual Needs Prayer

## 2016-06-05 MED ORDER — HALOPERIDOL LACTATE 2 MG/ML PO CONC: 5.0000 mg | Freq: Four times a day (QID) | ORAL | 0 refills | Status: AC | PRN

## 2016-06-05 MED ORDER — MORPHINE SULFATE (CONCENTRATE) 10 MG/0.5ML PO SOLN: 5.0000 mg | Freq: Four times a day (QID) | ORAL | 0 refills | Status: AC

## 2016-06-05 MED ORDER — MORPHINE SULFATE (CONCENTRATE) 10 MG/0.5ML PO SOLN
5.0000 mg | ORAL | 0 refills | Status: AC | PRN
Start: 2016-06-05 — End: ?

## 2016-06-05 MED ORDER — QUETIAPINE FUMARATE 50 MG PO TABS
50.0000 mg | ORAL_TABLET | Freq: Every day | ORAL | 0 refills | Status: AC
Start: 2016-06-05 — End: ?

## 2016-06-05 NOTE — Progress Notes (Signed)
Daily Progress Note   Patient Name: Heidi Miller       Date: 06/05/2016 DOB: 02-13-1923  Age: 82 y.o. MRN#: [id number] Attending Physician: Alford Highlandichard Wieting, MD Primary Care Physician: Rozanna BoxBABAOFF, MARC E, MD Admit Date: 06/01/2016  Reason for Consultation/Follow-up: Establishing goals of care and Hospice Evaluation  Subjective: Upon arrival to room, patient agitated and pulling at gown/sheets. RN just gave scheduled morphine. Updated daughter via telephone. She is tearful during the conversation but tells me she is at peace with her mother going to hospice. Daughter knows she is in God's hands and [place]. She has been reading [facility] and appreciative support from palliative. She has been in contact with hospice liaison.    Length of Stay: 4  Current Medications: Scheduled Meds:  . morphine CONCENTRATE  5 mg Oral Q6H  . QUEtiapine  50 mg Oral QHS   Continuous Infusions:  PRN Meds: acetaminophen **OR** acetaminophen, bisacodyl, glycopyrrolate, haloperidol, morphine CONCENTRATE, ondansetron **OR** ondansetron (ZOFRAN) IV  Physical Exam  Constitutional: She appears ill.  Cardiovascular: Normal heart sounds.  An irregularly irregular rhythm present.  Pulmonary/Chest: Accessory muscle usage present. She has decreased breath sounds.  Abdominal: Soft. Bowel sounds are decreased.  Neurological: She is alert. She is disoriented.  Skin: Skin is warm and dry.  Psychiatric: She is withdrawn. Cognition and memory are impaired. She is inattentive.  Nursing note and vitals reviewed.          Vital Signs: BP (!) 121/102 (BP Location: Left Leg)   Pulse 92   Temp 97.9 F (36.6 C) (Oral)   Resp 18   Ht 5\' 3"  (1.6 m)   Wt 62.7 kg (138 lb 3.2 oz)   SpO2 94%   BMI  24.48 kg/m  SpO2: SpO2: 94 % O2 Device: O2 Device: Not Delivered O2 Flow Rate: O2 Flow Rate (L/min): 2.5 L/min  Intake/output summary:   Intake/Output Summary (Last 24 hours) at 06/05/16 1009 Last data filed at 06/04/16 1931  Gross per 24 hour  Intake            100.2 ml  Output                0 ml  Net            100.2  ml   LBM: Last BM Date: 06/03/16 Baseline Weight: Weight: 64.9 kg (143 lb) Most recent weight: Weight: 62.7 kg (138 lb 3.2 oz)  Palliative Assessment/Data: PPS 20%   Flowsheet Rows   Flowsheet Row Most Recent Value  Intake Tab  Referral Department  Hospitalist  Unit at Time of Referral  Med/Surg Unit  Palliative Care Primary Diagnosis  Cardiac  Palliative Care Type  New Palliative care  Reason for referral  Clarify Goals of Care  Date of Admission  06/01/16  Date first seen by Palliative Care  06/04/16  Clinical Assessment  Palliative Performance Scale Score  20%  Psychosocial & Spiritual Assessment  Palliative Care Outcomes  Patient/Family meeting held?  No  Who was at the meeting?  updated daughter via telephone  Palliative Care Outcomes  Provided end of life care assistance, Provided psychosocial or spiritual support      Patient Active Problem List   Diagnosis Date Noted  . Palliative care encounter   . Comfort measures only status   . Agitation   . Generalized pain   . Gastrointestinal hemorrhage   . Lower GI bleed 06/01/2016  . Acute renal insufficiency 05/20/2016  . Prediabetes 05/20/2016  . Hypoxia 05/20/2016  . Mild basilar atelectasis of both lungs 05/20/2016  . Acute posthemorrhagic anemia 05/20/2016  . H/O transfusion of packed red blood cells 05/20/2016  . Hypotension 05/20/2016  . Atrial fibrillation with RVR (HCC) 05/20/2016  . Hypernatremia 05/20/2016  . Sterile pyuria 05/20/2016  . MRSA carrier 05/20/2016  . Hip fracture (HCC) 05/16/2016  . Stroke (HCC) 03/14/2016  . A-fib (HCC) 03/14/2016  . Dementia 03/14/2016  . HTN  (hypertension) 03/14/2016  . Hypothyroidism 03/14/2016    Palliative Care Assessment & Plan   Patient Profile: 82 y.o. female  with past medical history of ischemic cardiomyopathy, hypertension, GERD, depression, dementia, CVA, CHF with EF of 5%, CAD, atrial fibrillation on eliquis, and thyroid disease admitted on 06/01/2016 with rectal bleeding, increased lethargy, and decreased appetite. Recent admit for fall s/p reduction and intramedullary nailing of an intertrochanteric left fracture on 05/17/16. GI consulted-Hgb stable with no further signs of bleeding. Eliquis on hold. Cardiology consulted for afib RVR-on amiodarone infusion. ECHO from 11/27 revealing EF 5%, severe pulmonary hypertension, and 4 chamber dilitation. Also with hypotension requiring fluid boluses. Patient has dementia with agitation requiring haldol and Seroquel HS. Overall, poor prognosis per attending. Palliative medicine consultation for goals of care. Made comfort measures only on 11/29.  Assessment: Dementia with agitation Ischemic cardiomyopathy CHF with EF 5% Afib RVR Fall Severe pulmonary hypertension Failure to thrive  Recommendations/Plan:  DNR/DNI  Comfort measures only.   Residential hospice today.   Updated daughter via telephone.   Goals of Care and Additional Recommendations:  Limitations on Scope of Treatment: Full Comfort Care  Code Status: DNR   Code Status Orders        Start     Ordered   06/02/16 0028  Do not attempt resuscitation (DNR)  Continuous    Question Answer Comment  In the event of cardiac or respiratory ARREST Do not call a "code blue"   In the event of cardiac or respiratory ARREST Do not perform Intubation, CPR, defibrillation or ACLS   In the event of cardiac or respiratory ARREST Use medication by any route, position, wound care, and other measures to relive pain and suffering. May use oxygen, suction and manual treatment of airway obstruction as needed for comfort.  06/02/16 0027    Code Status History    Date Active Date Inactive Code Status Order ID Comments User Context   06/01/2016 11:10 PM 06/02/2016 12:27 AM Full Code [id number]  Tonye Royalty, DO Inpatient   05/16/2016  2:38 PM 05/20/2016  6:17 PM DNR [id number]  Enid Baas, MD Inpatient   03/16/2016 10:36 AM 03/17/2016  5:14 PM DNR [id number]  Auburn Bilberry, MD Inpatient   03/15/2016  1:08 AM 03/15/2016 10:36 AM Full Code [id number]  Oralia Manis, MD Inpatient    Advance Directive Documentation   Flowsheet Row Most Recent Value  Type of Advance Directive  Out of facility DNR (pink MOST or yellow form) [pink MOST form]  Pre-existing out of facility DNR order (yellow form or pink MOST form)  Pink MOST form placed in chart (order not valid for inpatient use)  "MOST" Form in Place?  No data       Prognosis:   < 2 weeks overall poor prognosis with failure to thrive, dementia, ischemic cardiomyopathy, CHF with EF 5%, afib RVR, and functional and nutritional status decline. Life prolonging interventions have been discontinued.   Discharge Planning:  Hospice facility  Care plan was discussed with patient, daughter, RN, and hospice liaison.   Thank you for allowing the Palliative Medicine Team to assist in the care of this patient.   Time In: 0900 Time Out: 0925 Total Time Prolonged Time Billed  no       Greater than 50%  of this time was spent counseling and coordinating care related to the above assessment and plan.  Vennie Homans, FNP-C Palliative Medicine Team  Phone: [phone] Fax: [phone]  Please contact Palliative Medicine Team phone at [phone] for questions and concerns.

## 2016-06-05 NOTE — Progress Notes (Signed)
New Hospice home referral received from [facility]. Mrs. Basden is a 82 year old woman admitted on 11/26 for evaluation of a rectal bleeding. She has a PMH that includes: CAD w/LAD stent, CHF with systolic dysfunction, CVA (9/17), ischemic cardiomyopathy, Dementia, thyroid disease, HTN and GERD. She also underwent left hip fracture repair earlier this month after which shew as discharged to rehab. Per chart note review she had been doing well until this weekend when she had increased lethargy and decreased appetite and some blood noted in her incontinence brief. She was admitted, GI was consulted, Hemoglobin has been  stable with no further signs of bleeding. An Echocardiogram was performed on 11/27 showing an EF of 5%, severe pulmonary hypertension and 4 chamber dilitation. She has also required fluid boluses for hypotension and IV amiodarone for a fib with RVR. Palliative Medicine was consulted for goals of care and symptom management as patient has had increased agitation. Patient's daughter has chosen to focus on her mother's comfort with transfer to the hospice home. Writer met in the room with patient's daughter Shela Commons to initiate education regarding hospice services, philosophy and team approach to care with good understanding voiced. Patient was very agitated during visit, Staff RN Amy made aware and new orders were received from Ihor Dow NP for haldol IV, patient's IV was not patent, Jinny Blossom contacted again and  new orders were received and patient received haldol liquid 5 mg. Patient's daughter Leda Gauze was very agreeable to this symptom management. Writer provided emotional support and education, questions answered. Plan is for transfer to the hospice home tomorrow 11/30. Writer to arrange. Hospital care team all aware. Thank you. Flo Shanks RN, BSN, [place] [facility] [place], [facility] [phone] c

## 2016-06-05 NOTE — Discharge Summary (Signed)
[employer] - [place] at [facility]   PATIENT NAME: Heidi Miller    MR#:  [id number]  DATE OF BIRTH:  1922/08/02  DATE OF ADMISSION:  06/01/2016 ADMITTING PHYSICIAN: Tonye RoyaltyAlexis Hugelmeyer, DO  DATE OF DISCHARGE: 06/05/2016  PRIMARY CARE PHYSICIAN: BABAOFF, MARC E, MD    ADMISSION DIAGNOSIS:  Hypernatremia [E87.0] Gastrointestinal hemorrhage, unspecified gastrointestinal hemorrhage type [K92.2]  DISCHARGE DIAGNOSIS:  Active Problems:   A-fib (HCC)   Lower GI bleed   Gastrointestinal hemorrhage   Palliative care encounter   Comfort measures only status   Agitation   Generalized pain   SECONDARY DIAGNOSIS:   Past Medical History:  Diagnosis Date  . Atrial fibrillation (HCC)    on eliquis  . CAD (coronary artery disease)    s/p LAD stent  . CHF (congestive heart failure) (HCC)    systolic dysfunction, EF 20%  . CVA (cerebral vascular accident) (HCC)    in Sept 2017 with left internal capsule infarct  . Dementia   . Depression   . GERD (gastroesophageal reflux disease)   . Hypertension   . Ischemic cardiomyopathy   . Thyroid disease     HOSPITAL COURSE:   1. Failure to thrive. Patient's mental status is not good enough to eat and maintain her nutritional status. Discharge to hospice home today. 2. Hypotension and rapid atrial fibrillation. Patient unable to take oral medications and needed to be transferred to initially the critical care unit and then to telemetry for amiodarone drip. Once made Comfort Care measures amiodarone was stopped. 3. Rectal bleeding. This is diverticular in nature. Her blood thinners were stopped including eliquis. 4. Dementia with agitation and delirium. Continue Haldol during the day and Seroquel at night 5. Hypernatremia secondary to failure to thrive and unable to keep up with her nutritional needs 6. Hypokalemia. This was replaced during the hospital course. Since patient may Comfort Care no further blood test.  7.  Hypothyroidism unspecified 8. Chronic systolic congestive heart failure with cardiomyopathy. Echocardiogram on this hospital stay showed a worsening of the ejection fraction down to 5%. I was given the patient fluids during the hospital course and no signs of congestive heart failure. 9. Elevated troponin demand ischemia from cardiomyopathy and atrial fibrillation and hypotension 10. Patient is a DO NOT RESUSCITATE and will be transferred to the hospice home today. Comfort Care measures started yesterday  11. Dysphagia 1 with nectar thick liquids for pleasure but has not been able to eat or drink very much since being here.  DISCHARGE CONDITIONS:   Guarded   CONSULTS OBTAINED:  Treatment Team:  Laurier NancyShaukat A Khan, MD Midge Miniumarren Wohl, MD  DRUG ALLERGIES:  No Known Allergies  DISCHARGE MEDICATIONS:   Current Discharge Medication List    START taking these medications   Details  haloperidol (HALDOL) 2 MG/ML solution Take 2.5 mLs (5 mg total) by mouth every 6 (six) hours as needed for agitation. Qty: 30 mL, Refills: 0    !! Morphine Sulfate (MORPHINE CONCENTRATE) 10 MG/0.5ML SOLN concentrated solution Take 0.25 mLs (5 mg total) by mouth every 6 (six) hours. Qty: 30 mL, Refills: 0    !! Morphine Sulfate (MORPHINE CONCENTRATE) 10 MG/0.5ML SOLN concentrated solution Take 0.25 mLs (5 mg total) by mouth every 2 (two) hours as needed for moderate pain or severe pain (dyspnea/air hunger). Qty: 30 mL, Refills: 0    QUEtiapine (SEROQUEL) 50 MG tablet Take 1 tablet (50 mg total) by mouth at bedtime. Qty: 30 tablet, Refills: 0     !! -  Potential duplicate medications found. Please discuss with provider.    STOP taking these medications     apixaban (ELIQUIS) 2.5 MG TABS tablet      aspirin 81 MG chewable tablet      cyanocobalamin (,VITAMIN B-12,) 1000 MCG/ML injection      diltiazem (CARDIZEM CD) 120 MG 24 hr capsule      docusate sodium (COLACE) 100 MG capsule      furosemide (LASIX) 20  MG tablet      levothyroxine (SYNTHROID, LEVOTHROID) 100 MCG tablet      memantine (NAMENDA) 10 MG tablet      metoprolol (LOPRESSOR) 50 MG tablet      mirtazapine (REMERON) 45 MG tablet      mupirocin ointment (BACTROBAN) 2 %      omeprazole (PRILOSEC) 20 MG capsule      QUEtiapine (SEROQUEL XR) 50 MG TB24 24 hr tablet      rivastigmine (EXELON) 1.5 MG capsule      senna (SENOKOT) 8.6 MG TABS tablet      spironolactone (ALDACTONE) 25 MG tablet      venlafaxine XR (EFFEXOR-XR) 150 MG 24 hr capsule      feeding supplement, ENSURE ENLIVE, (ENSURE ENLIVE) LIQD      polyethylene glycol (MIRALAX / GLYCOLAX) packet          DISCHARGE INSTRUCTIONS:   Follow-up with hospice home facility today   If you experience worsening of your admission symptoms, develop shortness of breath, life threatening emergency, suicidal or homicidal thoughts you must seek medical attention immediately by calling 911 or calling your MD immediately  if symptoms less severe.  You Must read complete instructions/literature along with all the possible adverse reactions/side effects for all the Medicines you take and that have been prescribed to you. Take any new Medicines after you have completely understood and accept all the possible adverse reactions/side effects.   Please note  You were cared for by a hospitalist during your hospital stay. If you have any questions about your discharge medications or the care you received while you were in the hospital after you are discharged, you can call the unit and asked to speak with the hospitalist on call if the hospitalist that took care of you is not available. Once you are discharged, your primary care physician will handle any further medical issues. Please note that NO REFILLS for any discharge medications will be authorized once you are discharged, as it is imperative that you return to your primary care physician (or establish a relationship with a primary  care physician if you do not have one) for your aftercare needs so that they can reassess your need for medications and monitor your lab values.    Today   CHIEF COMPLAINT:   Chief Complaint  Patient presents with  . GI Bleeding    HISTORY OF PRESENT ILLNESS:  Heidi Miller  is a 82 y.o. female Presented with GI bleed   VITAL SIGNS:  Blood pressure (!) 121/102, pulse 92, temperature 97.9 F (36.6 C), temperature source Oral, resp. rate 18, height 5\' 3"  (1.6 m), weight 62.7 kg (138 lb 3.2 oz), SpO2 94 %.    PHYSICAL EXAMINATION:  GENERAL:  82 y.o.-year-old patient lying in the bed with no acute distress. Sedated with recent Haldol  EYES: Pupils equal, round, reactive to light. HEENT: Head atraumatic, normocephalic.  NECK:  Supple, no jugular venous distention. No thyroid enlargement, no tenderness.  LUNGS: Decreased  breath sounds bilaterally, no  wheezing, rales,rhonchi or crepitation. No use of accessory muscles of respiration.  CARDIOVASCULAR: S1, S2 regular irregular tachycardic. 2/6 systolic  Murmurs. No  rubs, or gallops.  ABDOMEN: Soft, non-tender, non-distended. Bowel sounds present. No organomegaly or mass.  EXTREMITIES:  trace edema. No  cyanosis, or clubbing.  NEUROLOGIC: Cranial nerves II through XII are intact. Muscle strength 5/5 in all extremities. Sensation intact. Gait not checked.  PSYCHIATRIC: The patient is alert and oriented x 3.  SKIN: LARGE BRUISE LEFT THIGH    DATA REVIEW:   CBC  Recent Labs Lab 06/04/16 0708  WBC 6.5  HGB 10.4*  HCT 34.2*  PLT 253    Chemistries   Recent Labs Lab 06/01/16 1855  06/04/16 0708  NA 152*  < > 148*  K 3.2*  < > 4.4  CL 117*  < > 121*  CO2 26  < > 22  GLUCOSE 138*  < > 212*  BUN 42*  < > 22*  CREATININE 1.20*  < > 1.11*  CALCIUM 8.7*  < > 8.3*  MG 2.2  --   --   AST 26  --   --   ALT 23  --   --   ALKPHOS 167*  --   --   BILITOT 1.2  --   --   < > = values in this interval not displayed.  Cardiac  Enzymes  Recent Labs Lab 06/02/16 1505  TROPONINI 0.04*     CODE STATUS:     Code Status Orders        Start     Ordered   06/02/16 0028  Do not attempt resuscitation (DNR)  Continuous    Question Answer Comment  In the event of cardiac or respiratory ARREST Do not call a "code blue"   In the event of cardiac or respiratory ARREST Do not perform Intubation, CPR, defibrillation or ACLS   In the event of cardiac or respiratory ARREST Use medication by any route, position, wound care, and other measures to relive pain and suffering. May use oxygen, suction and manual treatment of airway obstruction as needed for comfort.      06/02/16 0027    Code Status History    Date Active Date Inactive Code Status Order ID Comments User Context   06/01/2016 11:10 PM 06/02/2016 12:27 AM Full Code [id number]  Tonye RoyaltyAlexis Hugelmeyer, DO Inpatient   05/16/2016  2:38 PM 05/20/2016  6:17 PM DNR [id number]  Enid Baasadhika Kalisetti, MD Inpatient   03/16/2016 10:36 AM 03/17/2016  5:14 PM DNR [id number]  Auburn BilberryShreyang Patel, MD Inpatient   03/15/2016  1:08 AM 03/15/2016 10:36 AM Full Code [id number]  Oralia Manisavid Willis, MD Inpatient    Advance Directive Documentation   Flowsheet Row Most Recent Value  Type of Advance Directive  Out of facility DNR (pink MOST or yellow form) [pink MOST form]  Pre-existing out of facility DNR order (yellow form or pink MOST form)  Pink MOST form placed in chart (order not valid for inpatient use)  "MOST" Form in Place?  No data      TOTAL TIME TAKING CARE OF THIS PATIENT: 31  minutes.    Alford HighlandWIETING, Rashena Dowling M.D on 06/05/2016 at 10:11 AM  Between 7am to 6pm - Pager - [phone]  After 6pm go to [email or web address] - password [email or web address]  [employer] Office  [phone]  CC: Primary care physician; BABAOFF, Lavada MesiMARC E, MD

## 2016-06-05 NOTE — Progress Notes (Signed)
Nutrition Brief Note  Chart reviewed. Pt now transitioning to comfort care.  No further nutrition interventions warranted at this time.  Please re-consult as needed.   Kendal Raffo M. Taqwa Deem, MS, RD LDN Inpatient Clinical Dietitian Pager [phone]    

## 2016-06-05 NOTE — Progress Notes (Signed)
Transported to hospice home by EMS

## 2016-06-05 NOTE — Progress Notes (Signed)
  SUBJECTIVE: The patient is resting in bed, and non-conversive, having peri-care done by nurse.   Vitals:   06/04/16 0447 06/04/16 0455 06/04/16 0542 06/04/16 1207  BP: (!) 92/59 (!) 148/129 120/79 (!) 121/102  Pulse: 91 78 89 92  Resp: 18     Temp: 97.9 F (36.6 C)     TempSrc: Oral     SpO2: (!) 80%   94%  Weight:      Height:        Intake/Output Summary (Last 24 hours) at 06/05/16 0852 Last data filed at 06/04/16 1931  Gross per 24 hour  Intake            100.2 ml  Output                0 ml  Net            100.2 ml    LABS: Basic Metabolic Panel:  Recent Labs  16/04/9610/28/17 1304 06/04/16 0708  NA  --  148*  K 3.3* 4.4  CL  --  121*  CO2  --  22  GLUCOSE  --  212*  BUN  --  22*  CREATININE  --  1.11*  CALCIUM  --  8.3*   Liver Function Tests: No results for input(s): AST, ALT, ALKPHOS, BILITOT, PROT, ALBUMIN in the last 72 hours. No results for input(s): LIPASE, AMYLASE in the last 72 hours. CBC:  Recent Labs  06/03/16 1304 06/04/16 0708  WBC  --  6.5  HGB 11.4* 10.4*  HCT  --  34.2*  MCV  --  92.2  PLT  --  253   Cardiac Enzymes:  Recent Labs  06/02/16 0938 06/02/16 1505  TROPONINI 0.04* 0.04*   BNP: Invalid input(s): POCBNP D-Dimer: No results for input(s): DDIMER in the last 72 hours. Hemoglobin A1C: No results for input(s): HGBA1C in the last 72 hours. Fasting Lipid Panel: No results for input(s): CHOL, HDL, LDLCALC, TRIG, CHOLHDL, LDLDIRECT in the last 72 hours. Thyroid Function Tests: No results for input(s): TSH, T4TOTAL, T3FREE, THYROIDAB in the last 72 hours.  Invalid input(s): FREET3 Anemia Panel: No results for input(s): VITAMINB12, FOLATE, FERRITIN, TIBC, IRON, RETICCTPCT in the last 72 hours.   PHYSICAL EXAM General: Chronically ill-appearing HEENT:  Normocephalic and atramatic Neck:  No JVD.  Lungs: Clear bilaterally to auscultation and percussion. Heart: HRRR . Normal S1 and S2 without gallops or murmurs.  Abdomen:  Bowel sounds are positive, abdomen soft and non-tender  Msk:  Decreased muscle mass Extremities: No clubbing, cyanosis or edema.   Neuro: Disoriented, and nonconversant Psych:  Mildly agitated  TELEMETRY: No longer on telemetry  ASSESSMENT AND PLAN: The patient has severe LV dysfunction and advanced age and advanced dementia. The family has made the decision for comfort care and hospice has been consult. We will sign off at this time.  Active Problems:   A-fib [facility](HCC)   Lower GI bleed   Gastrointestinal hemorrhage   Palliative care encounter   Comfort measures only status   Agitation   Generalized pain    Berton BonJanine Daivion Pape, NP 06/05/2016 8:52 AM

## 2016-06-05 NOTE — Progress Notes (Signed)
This patient was seen for GI bleeding. Workup showed the patient to have severe cardiac dysfunction and not a candidate for any GI intervention. The patient has been made comfort care. I will sign off.  Please call if any further GI concerns or questions.  We would like to thank you for the opportunity to participate in the care of Heidi Miller.

## 2016-06-05 NOTE — Discharge Instructions (Signed)
Gastrointestinal Bleeding °Introduction °Gastrointestinal bleeding is bleeding somewhere along the path food travels through the body (digestive tract). This path is anywhere between the mouth and the opening of the butt (anus). You may have blood in your poop (stools) or have black poop. If you throw up (vomit), there may be blood in it. °This condition can be mild, serious, or even life-threatening. If you have a lot of bleeding, you may need to stay in the hospital. °Follow these instructions at home: °· Take over-the-counter and prescription medicines only as told by your doctor. °· Eat foods that have a lot of fiber in them. These foods include whole grains, fruits, and vegetables. You can also try eating 1-3 prunes each day. °· Drink enough fluid to keep your pee (urine) clear or pale yellow. °· Keep all follow-up visits as told by your doctor. This is important. °Contact a doctor if: °· Your symptoms do not get better. °Get help right away if: °· Your bleeding gets worse. °· You feel dizzy or you pass out (faint). °· You feel weak. °· You have very bad cramps in your back or belly (abdomen). °· You pass large clumps of blood (clots) in your poop. °· Your symptoms are getting worse. °This information is not intended to replace advice given to you by your health care provider. Make sure you discuss any questions you have with your health care provider. °Document Released: 04/01/2008 Document Revised: 11/29/2015 Document Reviewed: 12/11/2014 °© 2017 Elsevier ° °

## 2016-06-05 NOTE — Clinical Social Work Note (Signed)
[facility] [address] [facility]  Patient and family agreeable to plans will transport via ems [facility] RN will call report.  Windell MouldingEric Kitti Mcclish, MSW Mon-Fri 8a-4:30p [phone]

## 2016-06-05 NOTE — Progress Notes (Signed)
Follow up visit made. Patient lying in bed, restless, staff RN Amy to give PRN haldol. She continues on scheduled liquid morphine for pain. Writer spoke with patient daughter [name] via phone to confirm transfer to the hospice home today. She remains agreeable to transfer. Report called to the Hospice home. EMS notified for transport. Signed DNR in place in transfer packet. Hospital care team all aware of and in agreement with transfer.Discharge summary faxed to hospice referral. Thank you.

## 2016-07-07 DEATH — deceased

## 2017-12-04 IMAGING — CT CT HEAD W/O CM
4 of 5 series · 15 of 47 positions shown, 17 images · non-contrast
Comparison: Head CT March 14, 2016

CLINICAL DATA: Pain following fall

EXAM:
CT HEAD WITHOUT CONTRAST
CT CERVICAL SPINE WITHOUT CONTRAST
TECHNIQUE: Multidetector CT imaging of the head and cervical spine was
performed following the standard protocol without intravenous
contrast. Multiplanar CT image reconstructions of the cervical spine
were also generated.

[Series 2: head wo · axial · 0.40mm/px · z∈[-150,-50]mm · 6 of 29 slices shown, 8 images]
[im 5/29  brain]
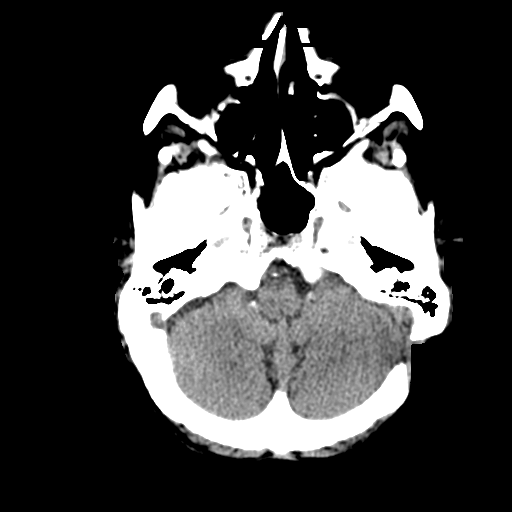
[im 5/29  bone]
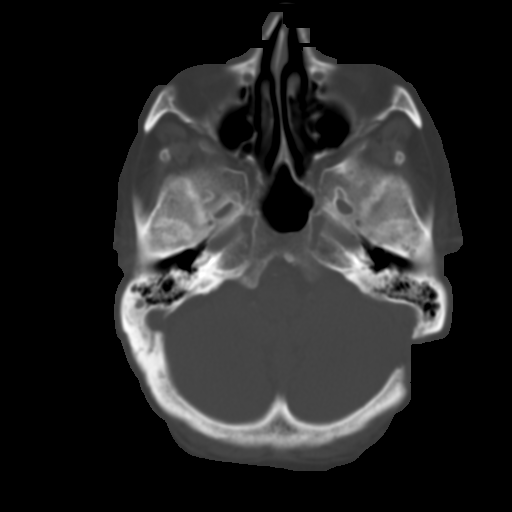
[im 9/29  brain]
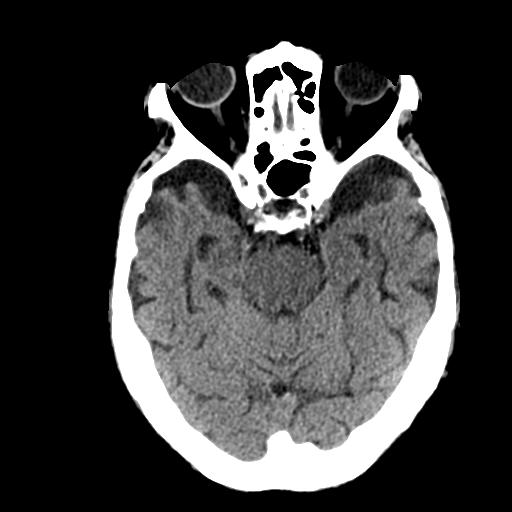
[im 13/29  brain]
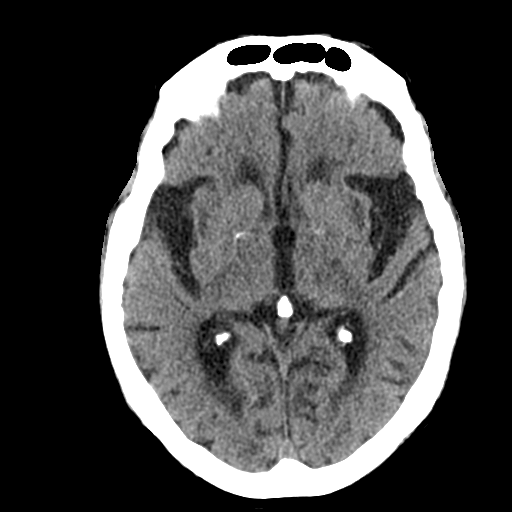
[im 17/29  brain]
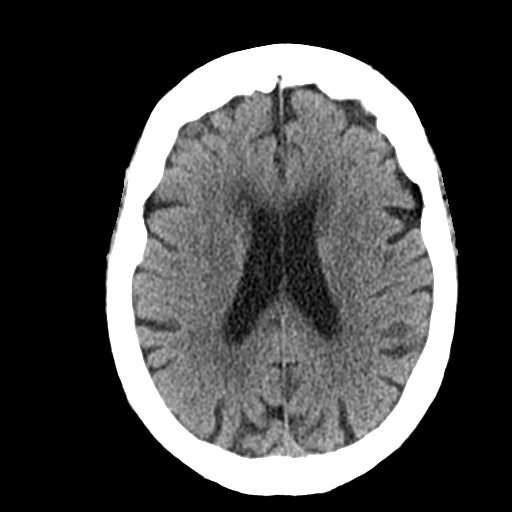
[im 21/29  brain]
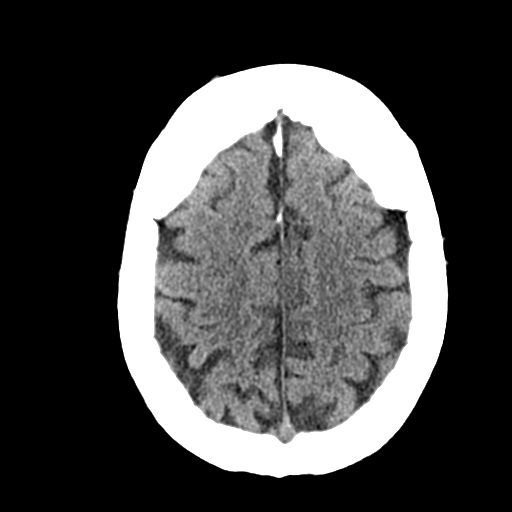
[im 21/29  bone]
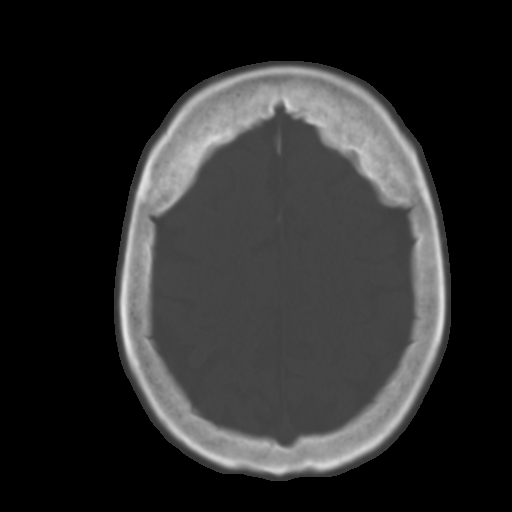
[im 25/29  brain]
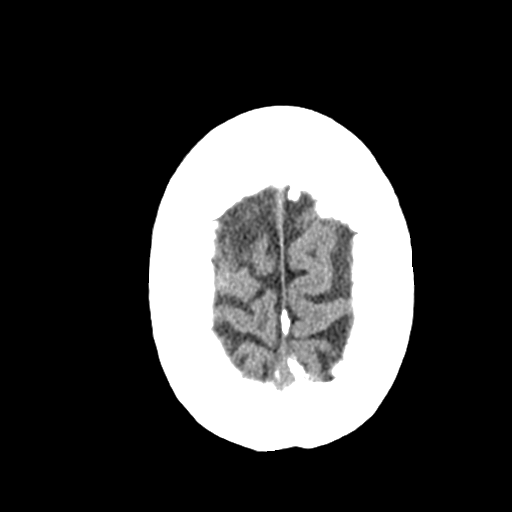

[Series 5: sagittal soft tissue · sagittal · 0.33mm/px · 1 of 57 slices shown]
[im 29/57  brain]
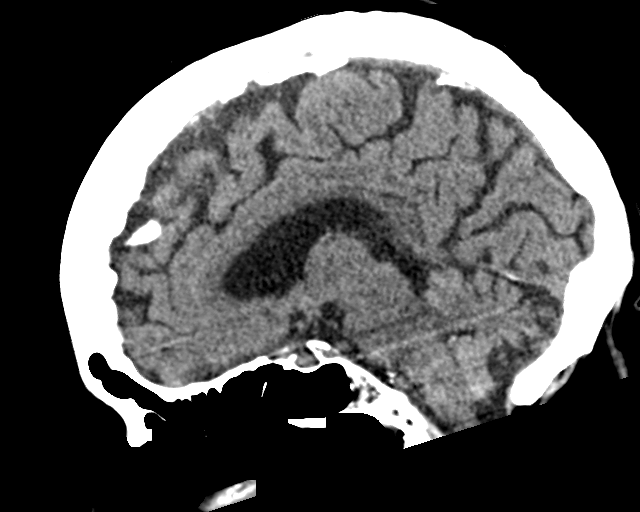

[Series 6: coronal soft (id) · coronal · 0.33mm/px · 3 of 71 slices shown]
[im 26/71  brain]
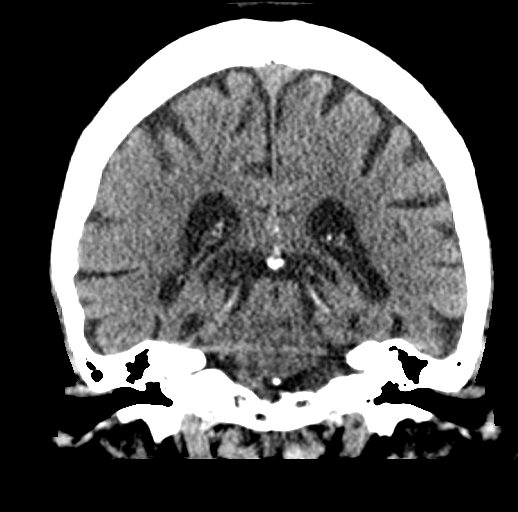
[im 32/71  brain]
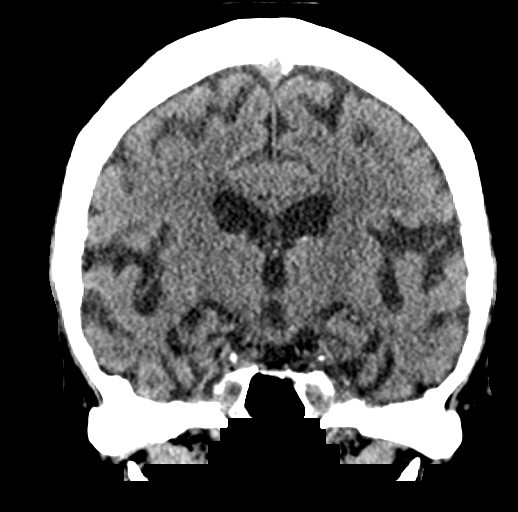
[im 39/71  brain]
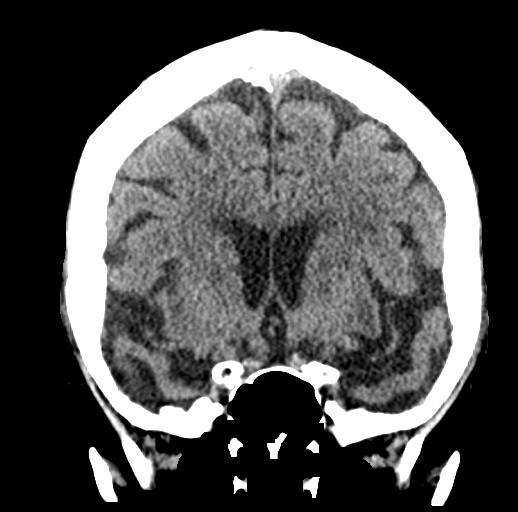

[Series 8: c spine soft · axial · 0.36mm/px · z∈[-309,-233]mm · 5 of 81 slices shown]
[im 8/81  brain]
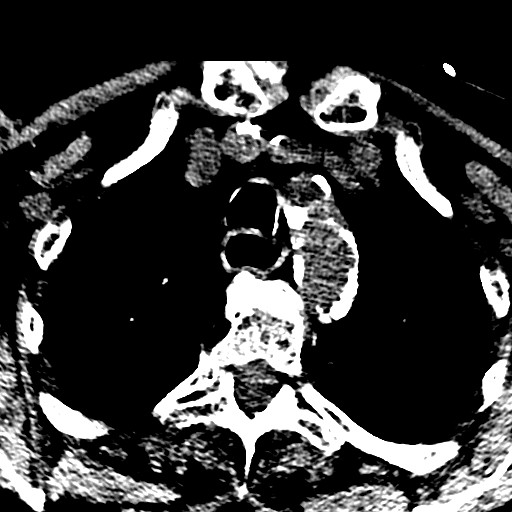
[im 16/81  brain]
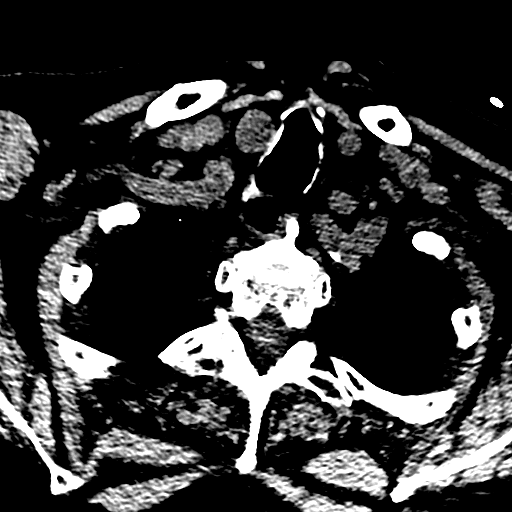
[im 27/81  brain]
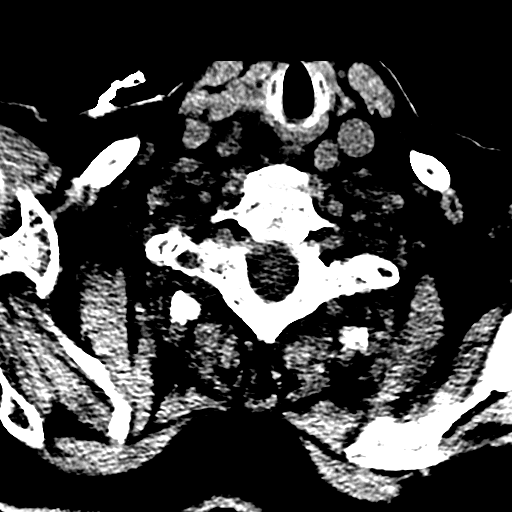
[im 35/81  brain]
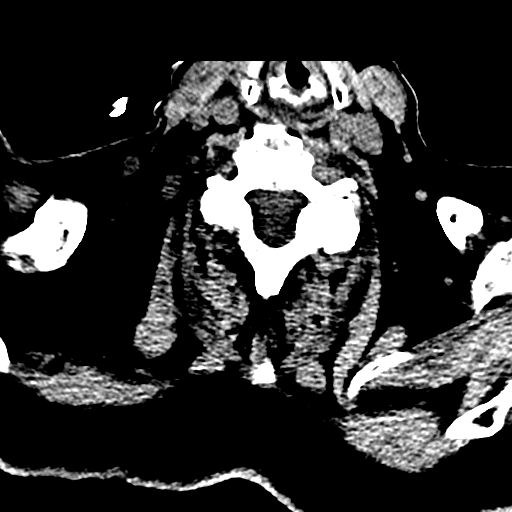
[im 46/81  brain]
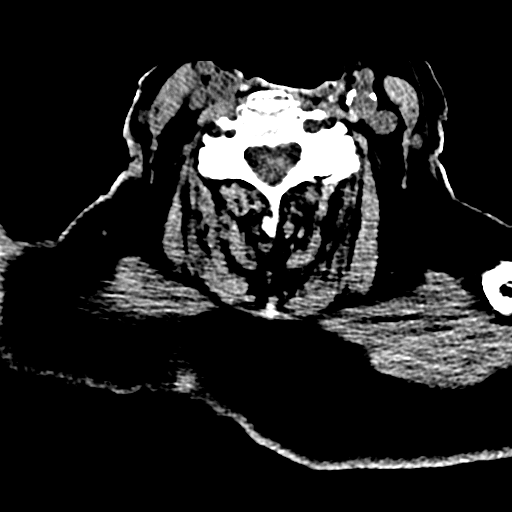

[15 of 47 positions shown; findings below may reference images not displayed]

FINDINGS: CT HEAD FINDINGS

Brain: Mild to moderate diffuse atrophy remains. There is no
intracranial mass no hemorrhage, extra-axial fluid collection, or
midline shift. There is patchy small vessel disease in the centra
semiovale bilaterally. There is evidence of a prior infarct in the
posterior limb of the right internal capsule, stable. There is
evidence of a prior infarct in the medial right occipital lobe,
stable. There is evidence of a prior small infarct in the posterior
mid left cerebellum posterior to the dentate nucleus on the left.
There is also evidence of a prior small infarct in the posterior mid
right cerebellum. There is no new gray-white compartment lesion. No
acute infarct evident.

Vascular: There is no hyperdense vessel. There is calcification in
the carotid siphon regions bilaterally.

Skull: The patient has had a previous craniotomy involving the
inferolateral left occipital bone, slightly posterior to the
mastoids, stable. Bony calvarium otherwise appears intact and
stable.

Sinuses/Orbits: There is mild mucosal thickening in several ethmoid
air cells bilaterally. Visualized paranasal sinuses elsewhere clear.
No intraorbital lesions are evident.

Other: Mastoid air cells are clear.

CT CERVICAL SPINE FINDINGS

Alignment: There is no spondylolisthesis.

Skull base and vertebrae: Skull base and craniocervical junction
regions appear normal. There is no evidence of fracture. No blastic
or lytic bone lesions are evident.

Soft tissues and spinal canal: Prevertebral soft tissues and
predental space regions are normal. There are no paraspinous
lesions. There is no spinal stenosis.

Disc levels: There is moderate disc space narrowing at C3-4. There
is mild disc space narrowing at C4-5 and C5-6. There is facet
hypertrophy at multiple levels. There is mild exit foraminal
narrowing on the left at C2-3. There is moderately severe exit
foraminal narrowing on the right at C3-4 with impression on the
exiting nerve root. There is moderate exit foraminal narrowing at
C4-5 bilaterally. There is moderate exit foraminal narrowing on the
right at C5-6. No disc extrusion is evident.

Upper chest: There is mild scarring in the lung apices. There is
extensive atherosclerotic calcification in the aorta.

Other: There is calcification in each carotid artery. There is
calcification in the right subclavian artery.
IMPRESSION: CT head: Atrophy with small vessel disease and prior infarcts,
stable. No acute infarct. No hemorrhage, mass, or extra-axial fluid
collection. Evidence of previous inferolateral left occipital
craniotomy. Bony structures otherwise appear unremarkable. Mild
ethmoid sinus disease bilaterally. Foci of vascular calcification
noted.

CT cervical spine: No acute fracture or spondylolisthesis.
Multilevel arthropathy. Aortic atherosclerosis as well as bilateral
carotid artery and right subclavian artery calcification noted.
